# Patient Record
Sex: Male | Born: 1935 | Race: White | Hispanic: No | Marital: Married | Smoking: Former smoker
Health system: Southern US, Community
[De-identification: ages and names within clinical notes are randomized; demographics above are authoritative.]

## PROBLEM LIST (undated history)

## (undated) DIAGNOSIS — F0280 Dementia in other diseases classified elsewhere without behavioral disturbance: Secondary | ICD-10-CM

## (undated) DIAGNOSIS — C801 Malignant (primary) neoplasm, unspecified: Secondary | ICD-10-CM

## (undated) DIAGNOSIS — M1990 Unspecified osteoarthritis, unspecified site: Secondary | ICD-10-CM

## (undated) DIAGNOSIS — I10 Essential (primary) hypertension: Secondary | ICD-10-CM

## (undated) DIAGNOSIS — K219 Gastro-esophageal reflux disease without esophagitis: Secondary | ICD-10-CM

## (undated) DIAGNOSIS — G309 Alzheimer's disease, unspecified: Secondary | ICD-10-CM

## (undated) DIAGNOSIS — E119 Type 2 diabetes mellitus without complications: Secondary | ICD-10-CM

## (undated) DIAGNOSIS — E785 Hyperlipidemia, unspecified: Secondary | ICD-10-CM

## (undated) HISTORY — PX: CATARACT EXTRACTION: SUR2

## (undated) HISTORY — PX: BACK SURGERY: SHX140

---

## 1998-10-04 ENCOUNTER — Ambulatory Visit (HOSPITAL_COMMUNITY): Admission: RE | Admit: 1998-10-04 | Discharge: 1998-10-04 | Payer: Self-pay | Admitting: Gastroenterology

## 1999-05-18 ENCOUNTER — Encounter (HOSPITAL_COMMUNITY): Admission: RE | Admit: 1999-05-18 | Discharge: 1999-08-16 | Payer: Self-pay | Admitting: Gastroenterology

## 1999-10-05 ENCOUNTER — Encounter (HOSPITAL_COMMUNITY): Admission: RE | Admit: 1999-10-05 | Discharge: 2000-01-03 | Payer: Self-pay | Admitting: Gastroenterology

## 2000-02-07 ENCOUNTER — Encounter (HOSPITAL_COMMUNITY): Admission: RE | Admit: 2000-02-07 | Discharge: 2000-05-07 | Payer: Self-pay | Admitting: Gastroenterology

## 2000-08-22 ENCOUNTER — Encounter (HOSPITAL_COMMUNITY): Admission: RE | Admit: 2000-08-22 | Discharge: 2000-11-20 | Payer: Self-pay | Admitting: Gastroenterology

## 2001-01-16 HISTORY — PX: ROTATOR CUFF REPAIR: SHX139

## 2001-02-13 ENCOUNTER — Encounter (HOSPITAL_COMMUNITY): Admission: RE | Admit: 2001-02-13 | Discharge: 2001-05-14 | Payer: Self-pay | Admitting: Gastroenterology

## 2001-05-23 ENCOUNTER — Ambulatory Visit (HOSPITAL_COMMUNITY): Admission: RE | Admit: 2001-05-23 | Discharge: 2001-05-23 | Payer: Self-pay | Admitting: Gastroenterology

## 2001-06-18 ENCOUNTER — Encounter (HOSPITAL_COMMUNITY): Admission: RE | Admit: 2001-06-18 | Discharge: 2001-09-16 | Payer: Self-pay | Admitting: Gastroenterology

## 2001-07-09 ENCOUNTER — Encounter: Payer: Self-pay | Admitting: Neurological Surgery

## 2001-07-09 ENCOUNTER — Ambulatory Visit (HOSPITAL_COMMUNITY): Admission: RE | Admit: 2001-07-09 | Discharge: 2001-07-10 | Payer: Self-pay | Admitting: Neurological Surgery

## 2001-09-02 ENCOUNTER — Encounter: Admission: RE | Admit: 2001-09-02 | Discharge: 2001-12-01 | Payer: Self-pay | Admitting: Internal Medicine

## 2001-10-22 ENCOUNTER — Encounter (HOSPITAL_COMMUNITY): Admission: RE | Admit: 2001-10-22 | Discharge: 2002-01-20 | Payer: Self-pay | Admitting: Gastroenterology

## 2002-02-25 ENCOUNTER — Ambulatory Visit (HOSPITAL_COMMUNITY): Admission: RE | Admit: 2002-02-25 | Discharge: 2002-02-25 | Payer: Self-pay | Admitting: Gastroenterology

## 2002-06-26 ENCOUNTER — Ambulatory Visit (HOSPITAL_COMMUNITY): Admission: RE | Admit: 2002-06-26 | Discharge: 2002-06-26 | Payer: Self-pay | Admitting: Gastroenterology

## 2002-10-28 ENCOUNTER — Ambulatory Visit (HOSPITAL_COMMUNITY): Admission: RE | Admit: 2002-10-28 | Discharge: 2002-10-28 | Payer: Self-pay | Admitting: Gastroenterology

## 2003-04-08 ENCOUNTER — Ambulatory Visit (HOSPITAL_COMMUNITY): Admission: RE | Admit: 2003-04-08 | Discharge: 2003-04-08 | Payer: Self-pay | Admitting: Gastroenterology

## 2003-08-19 ENCOUNTER — Ambulatory Visit (HOSPITAL_COMMUNITY): Admission: RE | Admit: 2003-08-19 | Discharge: 2003-08-19 | Payer: Self-pay | Admitting: Gastroenterology

## 2004-09-05 ENCOUNTER — Encounter: Admission: RE | Admit: 2004-09-05 | Discharge: 2004-09-05 | Payer: Self-pay | Admitting: Neurology

## 2007-11-07 ENCOUNTER — Encounter: Admission: RE | Admit: 2007-11-07 | Discharge: 2007-11-07 | Payer: Self-pay | Admitting: Internal Medicine

## 2008-05-05 ENCOUNTER — Encounter (HOSPITAL_COMMUNITY): Admission: RE | Admit: 2008-05-05 | Discharge: 2008-08-03 | Payer: Self-pay | Admitting: Gastroenterology

## 2008-07-28 ENCOUNTER — Encounter: Admission: RE | Admit: 2008-07-28 | Discharge: 2008-07-28 | Payer: Self-pay | Admitting: Internal Medicine

## 2008-08-06 ENCOUNTER — Encounter (HOSPITAL_COMMUNITY): Admission: RE | Admit: 2008-08-06 | Discharge: 2008-11-04 | Payer: Self-pay | Admitting: Gastroenterology

## 2009-02-24 ENCOUNTER — Encounter: Admission: RE | Admit: 2009-02-24 | Discharge: 2009-02-24 | Payer: Self-pay | Admitting: Endocrinology

## 2009-08-18 ENCOUNTER — Encounter: Admission: RE | Admit: 2009-08-18 | Discharge: 2009-08-18 | Payer: Self-pay | Admitting: Endocrinology

## 2010-02-06 ENCOUNTER — Encounter: Payer: Self-pay | Admitting: Internal Medicine

## 2011-01-25 DIAGNOSIS — M5137 Other intervertebral disc degeneration, lumbosacral region: Secondary | ICD-10-CM | POA: Diagnosis not present

## 2011-01-25 DIAGNOSIS — M999 Biomechanical lesion, unspecified: Secondary | ICD-10-CM | POA: Diagnosis not present

## 2011-02-01 DIAGNOSIS — M5137 Other intervertebral disc degeneration, lumbosacral region: Secondary | ICD-10-CM | POA: Diagnosis not present

## 2011-02-01 DIAGNOSIS — M999 Biomechanical lesion, unspecified: Secondary | ICD-10-CM | POA: Diagnosis not present

## 2011-02-08 DIAGNOSIS — M5137 Other intervertebral disc degeneration, lumbosacral region: Secondary | ICD-10-CM | POA: Diagnosis not present

## 2011-02-08 DIAGNOSIS — M999 Biomechanical lesion, unspecified: Secondary | ICD-10-CM | POA: Diagnosis not present

## 2011-02-15 DIAGNOSIS — M5137 Other intervertebral disc degeneration, lumbosacral region: Secondary | ICD-10-CM | POA: Diagnosis not present

## 2011-02-15 DIAGNOSIS — M999 Biomechanical lesion, unspecified: Secondary | ICD-10-CM | POA: Diagnosis not present

## 2011-03-22 DIAGNOSIS — M999 Biomechanical lesion, unspecified: Secondary | ICD-10-CM | POA: Diagnosis not present

## 2011-03-22 DIAGNOSIS — M5137 Other intervertebral disc degeneration, lumbosacral region: Secondary | ICD-10-CM | POA: Diagnosis not present

## 2011-03-29 DIAGNOSIS — M5137 Other intervertebral disc degeneration, lumbosacral region: Secondary | ICD-10-CM | POA: Diagnosis not present

## 2011-03-29 DIAGNOSIS — M999 Biomechanical lesion, unspecified: Secondary | ICD-10-CM | POA: Diagnosis not present

## 2011-04-04 DIAGNOSIS — M7020 Olecranon bursitis, unspecified elbow: Secondary | ICD-10-CM | POA: Diagnosis not present

## 2011-04-05 DIAGNOSIS — M999 Biomechanical lesion, unspecified: Secondary | ICD-10-CM | POA: Diagnosis not present

## 2011-04-05 DIAGNOSIS — M5137 Other intervertebral disc degeneration, lumbosacral region: Secondary | ICD-10-CM | POA: Diagnosis not present

## 2011-04-12 DIAGNOSIS — M999 Biomechanical lesion, unspecified: Secondary | ICD-10-CM | POA: Diagnosis not present

## 2011-04-12 DIAGNOSIS — M5137 Other intervertebral disc degeneration, lumbosacral region: Secondary | ICD-10-CM | POA: Diagnosis not present

## 2011-04-19 DIAGNOSIS — M999 Biomechanical lesion, unspecified: Secondary | ICD-10-CM | POA: Diagnosis not present

## 2011-04-19 DIAGNOSIS — M5137 Other intervertebral disc degeneration, lumbosacral region: Secondary | ICD-10-CM | POA: Diagnosis not present

## 2011-05-04 DIAGNOSIS — M999 Biomechanical lesion, unspecified: Secondary | ICD-10-CM | POA: Diagnosis not present

## 2011-05-04 DIAGNOSIS — M5137 Other intervertebral disc degeneration, lumbosacral region: Secondary | ICD-10-CM | POA: Diagnosis not present

## 2011-05-09 DIAGNOSIS — J329 Chronic sinusitis, unspecified: Secondary | ICD-10-CM | POA: Diagnosis not present

## 2011-05-24 DIAGNOSIS — M999 Biomechanical lesion, unspecified: Secondary | ICD-10-CM | POA: Diagnosis not present

## 2011-05-24 DIAGNOSIS — M5137 Other intervertebral disc degeneration, lumbosacral region: Secondary | ICD-10-CM | POA: Diagnosis not present

## 2011-05-24 DIAGNOSIS — M7020 Olecranon bursitis, unspecified elbow: Secondary | ICD-10-CM | POA: Diagnosis not present

## 2011-05-31 DIAGNOSIS — M5137 Other intervertebral disc degeneration, lumbosacral region: Secondary | ICD-10-CM | POA: Diagnosis not present

## 2011-05-31 DIAGNOSIS — M999 Biomechanical lesion, unspecified: Secondary | ICD-10-CM | POA: Diagnosis not present

## 2011-06-21 DIAGNOSIS — M5137 Other intervertebral disc degeneration, lumbosacral region: Secondary | ICD-10-CM | POA: Diagnosis not present

## 2011-06-21 DIAGNOSIS — M999 Biomechanical lesion, unspecified: Secondary | ICD-10-CM | POA: Diagnosis not present

## 2011-06-28 DIAGNOSIS — M999 Biomechanical lesion, unspecified: Secondary | ICD-10-CM | POA: Diagnosis not present

## 2011-06-28 DIAGNOSIS — M5137 Other intervertebral disc degeneration, lumbosacral region: Secondary | ICD-10-CM | POA: Diagnosis not present

## 2011-07-05 DIAGNOSIS — M5137 Other intervertebral disc degeneration, lumbosacral region: Secondary | ICD-10-CM | POA: Diagnosis not present

## 2011-07-05 DIAGNOSIS — M999 Biomechanical lesion, unspecified: Secondary | ICD-10-CM | POA: Diagnosis not present

## 2011-07-12 DIAGNOSIS — M999 Biomechanical lesion, unspecified: Secondary | ICD-10-CM | POA: Diagnosis not present

## 2011-07-12 DIAGNOSIS — M5137 Other intervertebral disc degeneration, lumbosacral region: Secondary | ICD-10-CM | POA: Diagnosis not present

## 2011-07-28 DIAGNOSIS — E7800 Pure hypercholesterolemia, unspecified: Secondary | ICD-10-CM | POA: Diagnosis not present

## 2011-08-01 DIAGNOSIS — E119 Type 2 diabetes mellitus without complications: Secondary | ICD-10-CM | POA: Diagnosis not present

## 2011-08-01 DIAGNOSIS — I10 Essential (primary) hypertension: Secondary | ICD-10-CM | POA: Diagnosis not present

## 2011-08-01 DIAGNOSIS — E7800 Pure hypercholesterolemia, unspecified: Secondary | ICD-10-CM | POA: Diagnosis not present

## 2011-08-02 DIAGNOSIS — M999 Biomechanical lesion, unspecified: Secondary | ICD-10-CM | POA: Diagnosis not present

## 2011-08-02 DIAGNOSIS — M5137 Other intervertebral disc degeneration, lumbosacral region: Secondary | ICD-10-CM | POA: Diagnosis not present

## 2011-08-09 DIAGNOSIS — M999 Biomechanical lesion, unspecified: Secondary | ICD-10-CM | POA: Diagnosis not present

## 2011-08-09 DIAGNOSIS — M5137 Other intervertebral disc degeneration, lumbosacral region: Secondary | ICD-10-CM | POA: Diagnosis not present

## 2011-08-16 DIAGNOSIS — M999 Biomechanical lesion, unspecified: Secondary | ICD-10-CM | POA: Diagnosis not present

## 2011-08-16 DIAGNOSIS — M5137 Other intervertebral disc degeneration, lumbosacral region: Secondary | ICD-10-CM | POA: Diagnosis not present

## 2011-09-06 DIAGNOSIS — M999 Biomechanical lesion, unspecified: Secondary | ICD-10-CM | POA: Diagnosis not present

## 2011-09-06 DIAGNOSIS — M5137 Other intervertebral disc degeneration, lumbosacral region: Secondary | ICD-10-CM | POA: Diagnosis not present

## 2011-09-13 DIAGNOSIS — M999 Biomechanical lesion, unspecified: Secondary | ICD-10-CM | POA: Diagnosis not present

## 2011-09-13 DIAGNOSIS — M5137 Other intervertebral disc degeneration, lumbosacral region: Secondary | ICD-10-CM | POA: Diagnosis not present

## 2011-09-20 DIAGNOSIS — M999 Biomechanical lesion, unspecified: Secondary | ICD-10-CM | POA: Diagnosis not present

## 2011-09-20 DIAGNOSIS — M5137 Other intervertebral disc degeneration, lumbosacral region: Secondary | ICD-10-CM | POA: Diagnosis not present

## 2011-09-27 DIAGNOSIS — M999 Biomechanical lesion, unspecified: Secondary | ICD-10-CM | POA: Diagnosis not present

## 2011-09-27 DIAGNOSIS — M5137 Other intervertebral disc degeneration, lumbosacral region: Secondary | ICD-10-CM | POA: Diagnosis not present

## 2011-10-04 DIAGNOSIS — M999 Biomechanical lesion, unspecified: Secondary | ICD-10-CM | POA: Diagnosis not present

## 2011-10-04 DIAGNOSIS — M5137 Other intervertebral disc degeneration, lumbosacral region: Secondary | ICD-10-CM | POA: Diagnosis not present

## 2011-10-11 DIAGNOSIS — M999 Biomechanical lesion, unspecified: Secondary | ICD-10-CM | POA: Diagnosis not present

## 2011-10-11 DIAGNOSIS — M5137 Other intervertebral disc degeneration, lumbosacral region: Secondary | ICD-10-CM | POA: Diagnosis not present

## 2011-10-18 DIAGNOSIS — M999 Biomechanical lesion, unspecified: Secondary | ICD-10-CM | POA: Diagnosis not present

## 2011-10-18 DIAGNOSIS — M5137 Other intervertebral disc degeneration, lumbosacral region: Secondary | ICD-10-CM | POA: Diagnosis not present

## 2011-10-20 DIAGNOSIS — D044 Carcinoma in situ of skin of scalp and neck: Secondary | ICD-10-CM | POA: Diagnosis not present

## 2011-10-25 DIAGNOSIS — M5137 Other intervertebral disc degeneration, lumbosacral region: Secondary | ICD-10-CM | POA: Diagnosis not present

## 2011-10-25 DIAGNOSIS — M999 Biomechanical lesion, unspecified: Secondary | ICD-10-CM | POA: Diagnosis not present

## 2011-11-01 DIAGNOSIS — M999 Biomechanical lesion, unspecified: Secondary | ICD-10-CM | POA: Diagnosis not present

## 2011-11-01 DIAGNOSIS — M5137 Other intervertebral disc degeneration, lumbosacral region: Secondary | ICD-10-CM | POA: Diagnosis not present

## 2011-11-07 DIAGNOSIS — E119 Type 2 diabetes mellitus without complications: Secondary | ICD-10-CM | POA: Diagnosis not present

## 2011-11-07 DIAGNOSIS — E7800 Pure hypercholesterolemia, unspecified: Secondary | ICD-10-CM | POA: Diagnosis not present

## 2011-11-08 DIAGNOSIS — M5137 Other intervertebral disc degeneration, lumbosacral region: Secondary | ICD-10-CM | POA: Diagnosis not present

## 2011-11-08 DIAGNOSIS — M999 Biomechanical lesion, unspecified: Secondary | ICD-10-CM | POA: Diagnosis not present

## 2011-11-09 DIAGNOSIS — E119 Type 2 diabetes mellitus without complications: Secondary | ICD-10-CM | POA: Diagnosis not present

## 2011-11-09 DIAGNOSIS — E7800 Pure hypercholesterolemia, unspecified: Secondary | ICD-10-CM | POA: Diagnosis not present

## 2011-11-09 DIAGNOSIS — I10 Essential (primary) hypertension: Secondary | ICD-10-CM | POA: Diagnosis not present

## 2011-11-09 DIAGNOSIS — Z23 Encounter for immunization: Secondary | ICD-10-CM | POA: Diagnosis not present

## 2011-11-15 DIAGNOSIS — M999 Biomechanical lesion, unspecified: Secondary | ICD-10-CM | POA: Diagnosis not present

## 2011-11-15 DIAGNOSIS — M5137 Other intervertebral disc degeneration, lumbosacral region: Secondary | ICD-10-CM | POA: Diagnosis not present

## 2011-11-22 DIAGNOSIS — M999 Biomechanical lesion, unspecified: Secondary | ICD-10-CM | POA: Diagnosis not present

## 2011-11-22 DIAGNOSIS — M5137 Other intervertebral disc degeneration, lumbosacral region: Secondary | ICD-10-CM | POA: Diagnosis not present

## 2011-11-23 DIAGNOSIS — D044 Carcinoma in situ of skin of scalp and neck: Secondary | ICD-10-CM | POA: Diagnosis not present

## 2011-11-23 DIAGNOSIS — L570 Actinic keratosis: Secondary | ICD-10-CM | POA: Diagnosis not present

## 2011-11-29 DIAGNOSIS — M999 Biomechanical lesion, unspecified: Secondary | ICD-10-CM | POA: Diagnosis not present

## 2011-11-29 DIAGNOSIS — M5137 Other intervertebral disc degeneration, lumbosacral region: Secondary | ICD-10-CM | POA: Diagnosis not present

## 2011-12-04 DIAGNOSIS — E119 Type 2 diabetes mellitus without complications: Secondary | ICD-10-CM | POA: Diagnosis not present

## 2011-12-04 DIAGNOSIS — Z0000 Encounter for general adult medical examination without abnormal findings: Secondary | ICD-10-CM | POA: Diagnosis not present

## 2011-12-04 DIAGNOSIS — Z125 Encounter for screening for malignant neoplasm of prostate: Secondary | ICD-10-CM | POA: Diagnosis not present

## 2011-12-06 DIAGNOSIS — M999 Biomechanical lesion, unspecified: Secondary | ICD-10-CM | POA: Diagnosis not present

## 2011-12-06 DIAGNOSIS — M5137 Other intervertebral disc degeneration, lumbosacral region: Secondary | ICD-10-CM | POA: Diagnosis not present

## 2011-12-08 DIAGNOSIS — Z0000 Encounter for general adult medical examination without abnormal findings: Secondary | ICD-10-CM | POA: Diagnosis not present

## 2011-12-08 DIAGNOSIS — R195 Other fecal abnormalities: Secondary | ICD-10-CM | POA: Diagnosis not present

## 2011-12-08 DIAGNOSIS — K921 Melena: Secondary | ICD-10-CM | POA: Diagnosis not present

## 2011-12-08 DIAGNOSIS — E7800 Pure hypercholesterolemia, unspecified: Secondary | ICD-10-CM | POA: Diagnosis not present

## 2011-12-08 DIAGNOSIS — K219 Gastro-esophageal reflux disease without esophagitis: Secondary | ICD-10-CM | POA: Diagnosis not present

## 2011-12-08 DIAGNOSIS — I10 Essential (primary) hypertension: Secondary | ICD-10-CM | POA: Diagnosis not present

## 2011-12-08 DIAGNOSIS — E119 Type 2 diabetes mellitus without complications: Secondary | ICD-10-CM | POA: Diagnosis not present

## 2011-12-19 DIAGNOSIS — E7800 Pure hypercholesterolemia, unspecified: Secondary | ICD-10-CM | POA: Diagnosis not present

## 2011-12-19 DIAGNOSIS — Z1212 Encounter for screening for malignant neoplasm of rectum: Secondary | ICD-10-CM | POA: Diagnosis not present

## 2011-12-19 DIAGNOSIS — E119 Type 2 diabetes mellitus without complications: Secondary | ICD-10-CM | POA: Diagnosis not present

## 2011-12-21 DIAGNOSIS — I10 Essential (primary) hypertension: Secondary | ICD-10-CM | POA: Diagnosis not present

## 2011-12-21 DIAGNOSIS — E119 Type 2 diabetes mellitus without complications: Secondary | ICD-10-CM | POA: Diagnosis not present

## 2011-12-21 DIAGNOSIS — M5137 Other intervertebral disc degeneration, lumbosacral region: Secondary | ICD-10-CM | POA: Diagnosis not present

## 2011-12-21 DIAGNOSIS — E7800 Pure hypercholesterolemia, unspecified: Secondary | ICD-10-CM | POA: Diagnosis not present

## 2011-12-21 DIAGNOSIS — M999 Biomechanical lesion, unspecified: Secondary | ICD-10-CM | POA: Diagnosis not present

## 2011-12-27 DIAGNOSIS — M999 Biomechanical lesion, unspecified: Secondary | ICD-10-CM | POA: Diagnosis not present

## 2011-12-27 DIAGNOSIS — M5137 Other intervertebral disc degeneration, lumbosacral region: Secondary | ICD-10-CM | POA: Diagnosis not present

## 2011-12-28 DIAGNOSIS — E119 Type 2 diabetes mellitus without complications: Secondary | ICD-10-CM | POA: Diagnosis not present

## 2012-01-01 ENCOUNTER — Emergency Department (HOSPITAL_COMMUNITY): Payer: Medicare Other

## 2012-01-01 ENCOUNTER — Encounter (HOSPITAL_COMMUNITY): Payer: Self-pay | Admitting: Emergency Medicine

## 2012-01-01 ENCOUNTER — Emergency Department (HOSPITAL_COMMUNITY)
Admission: EM | Admit: 2012-01-01 | Discharge: 2012-01-01 | Disposition: A | Discharge status: Home / Self Care | Payer: Medicare Other | Attending: Emergency Medicine | Admitting: Emergency Medicine

## 2012-01-01 DIAGNOSIS — R339 Retention of urine, unspecified: Secondary | ICD-10-CM | POA: Insufficient documentation

## 2012-01-01 DIAGNOSIS — E119 Type 2 diabetes mellitus without complications: Secondary | ICD-10-CM | POA: Diagnosis not present

## 2012-01-01 DIAGNOSIS — K5900 Constipation, unspecified: Secondary | ICD-10-CM | POA: Diagnosis not present

## 2012-01-01 DIAGNOSIS — E785 Hyperlipidemia, unspecified: Secondary | ICD-10-CM | POA: Insufficient documentation

## 2012-01-01 DIAGNOSIS — Z79899 Other long term (current) drug therapy: Secondary | ICD-10-CM | POA: Diagnosis not present

## 2012-01-01 DIAGNOSIS — C4490 Unspecified malignant neoplasm of skin, unspecified: Secondary | ICD-10-CM | POA: Insufficient documentation

## 2012-01-01 DIAGNOSIS — Z87891 Personal history of nicotine dependence: Secondary | ICD-10-CM | POA: Diagnosis not present

## 2012-01-01 HISTORY — DX: Type 2 diabetes mellitus without complications: E11.9

## 2012-01-01 HISTORY — DX: Malignant (primary) neoplasm, unspecified: C80.1

## 2012-01-01 HISTORY — DX: Hyperlipidemia, unspecified: E78.5

## 2012-01-01 LAB — COMPREHENSIVE METABOLIC PANEL
AST: 71 U/L — ABNORMAL HIGH (ref 0–37)
BUN: 19 mg/dL (ref 6–23)
CO2: 26 mEq/L (ref 19–32)
Calcium: 9.4 mg/dL (ref 8.4–10.5)
Chloride: 98 mEq/L (ref 96–112)
Creatinine, Ser: 0.72 mg/dL (ref 0.50–1.35)
GFR calc Af Amer: >90 mL/min (ref >90)
GFR calc non Af Amer: 90 mL/min — ABNORMAL LOW (ref >90)
Glucose, Bld: 169 mg/dL — ABNORMAL HIGH (ref 70–99)
Total Bilirubin: 0.6 mg/dL (ref 0.3–1.2)

## 2012-01-01 LAB — CBC WITH DIFFERENTIAL/PLATELET
Basophils Absolute: 0 10*3/uL (ref 0.0–0.1)
Eosinophils Relative: 0 % (ref 0–5)
HCT: 44.4 % (ref 39.0–52.0)
Hemoglobin: 11.4 g/dL — ABNORMAL LOW (ref 13.0–17.0)
Lymphocytes Relative: 3 % — ABNORMAL LOW (ref 12–46)
MCHC: 25.7 g/dL — ABNORMAL LOW (ref 30.0–36.0)
MCV: 91.5 fL (ref 78.0–100.0)
Monocytes Absolute: 0.4 10*3/uL (ref 0.1–1.0)
Monocytes Relative: 4 % (ref 3–12)
RDW: 11.9 % (ref 11.5–15.5)
WBC: 11.4 10*3/uL — ABNORMAL HIGH (ref 4.0–10.5)

## 2012-01-01 LAB — URINALYSIS, MICROSCOPIC ONLY
Leukocytes, UA: NEGATIVE
Nitrite: NEGATIVE
Protein, ur: NEGATIVE mg/dL
Specific Gravity, Urine: 1.024 (ref 1.005–1.030)
Urobilinogen, UA: 1 mg/dL (ref 0.0–1.0)

## 2012-01-01 NOTE — ED Notes (Signed)
Pt states he has been unable to void since 6am, c/o pressure in his lower abd.  Pt also c/o constipation, to a laxative this am, pt has had some relief since laxative.  Pt denies h/o urinary retention in the past.

## 2012-01-01 NOTE — ED Notes (Signed)
Pt reports "external" bleeding, hx of hemmroids

## 2012-01-01 NOTE — ED Provider Notes (Signed)
History     CSN: [id number]  Arrival date & time 01/01/12  1251   First MD Initiated Contact with Patient 01/01/12 1334      Chief Complaint  Patient presents with  . Urinary Retention  . Constipation    (Consider location/radiation/quality/duration/timing/severity/associated sxs/prior treatment) The history is provided by the patient.  Daniel [name] is a 76 y.o. male hx of DM, HL here with lower ab pain, constipation, urinary retention. Since 6 AM this morning, he was unable to urinate. He was also constipated and took some miralax and was able to have a large bowel movement and felt better. Was only able to urinate very little. No hx of prostate Ca or BPH in the past. No fevers.    Past Medical History  Diagnosis Date  . Cancer     Skin  . Diabetes mellitus without complication   . Hyperlipidemia     Past Surgical History  Procedure Date  . Rotator cuff repair 2003  . Cataract extraction   . Back surgery     No family history on file.  History  Substance Use Topics  . Smoking status: Former Smoker    Quit date: 01/16/1978  . Smokeless tobacco: Not on file  . Alcohol Use: Yes     Comment: occasionally      Review of Systems  Gastrointestinal: Positive for abdominal pain and constipation.  All other systems reviewed and are negative.    Allergies  Review of patient's allergies indicates no known allergies.  Home Medications   Current Outpatient Rx  Name  Route  Sig  Dispense  Refill  . ATORVASTATIN CALCIUM 40 MG PO TABS   Oral   Take 40 mg by mouth daily.         . CELECOXIB 200 MG PO CAPS   Oral   Take 200 mg by mouth daily.         Marland Kitchen MAGNESIUM-ZINC PO   Oral   Take 1 tablet by mouth daily.         . ADULT MULTIVITAMIN W/MINERALS CH   Oral   Take 1 tablet by mouth daily.         Marland Kitchen PANTOPRAZOLE SODIUM 40 MG PO TBEC   Oral   Take 40 mg by mouth daily.         Marland Kitchen RAMIPRIL 2.5 MG PO CAPS   Oral   Take 2.5 mg by mouth every 3  (three) days.         . IMIQUIMOD 5 % EX CREA   Topical   Apply 1 application topically 3 (three) times a week. This was a medication used for skin cancer. Pt was taking three times a week for 8 weeks. Last dose was 12/29/11           BP 144/106  Pulse 81  Temp 97.6 F (36.4 C) (Oral)  Resp 18  SpO2 100%  Physical Exam  Nursing note and vitals reviewed. Constitutional: He is oriented to person, place, and time. He appears well-developed and well-nourished.  HENT:  Head: Normocephalic.  Mouth/Throat: Oropharynx is clear and moist.  Eyes: Conjunctivae normal are normal. Pupils are equal, round, and reactive to light.  Neck: Normal range of motion. Neck supple.  Cardiovascular: Normal rate, regular rhythm and normal heart sounds.   Pulmonary/Chest: Effort normal and breath sounds normal. No respiratory distress. He has no wheezes. He has no rales.  Abdominal: Soft. Bowel sounds are normal.  Mild suprapubic tenderness. Rectal- no stool impaction. Prostate not tender. No CVAT   Musculoskeletal: Normal range of motion. He exhibits no edema and no tenderness.  Neurological: He is alert and oriented to person, place, and time.  Skin: Skin is warm and dry.  Psychiatric: He has a normal mood and affect. His behavior is normal. Judgment and thought content normal.    ED Course  Procedures (including critical care time)  Labs Reviewed  GLUCOSE, CAPILLARY - Abnormal; Notable for the following:    Glucose-Capillary 154 (*)     All other components within normal limits  CBC WITH DIFFERENTIAL - Abnormal; Notable for the following:    WBC 11.4 (*)     Hemoglobin 11.4 (*)     MCH 23.5 (*)     MCHC 25.7 (*)     Neutrophils Relative 93 (*)     Neutro Abs 10.6 (*)     Lymphocytes Relative 3 (*)     Lymphs Abs 0.4 (*)     All other components within normal limits  COMPREHENSIVE METABOLIC PANEL - Abnormal; Notable for the following:    Glucose, Bld 169 (*)     AST 71 (*)      ALT 83 (*)     GFR calc non Af Amer 90 (*)     All other components within normal limits  URINALYSIS, MICROSCOPIC ONLY - Abnormal; Notable for the following:    Glucose, UA 100 (*)     Squamous Epithelial / LPF FEW (*)     All other components within normal limits   No results found.   No diagnosis found.    MDM  Daniel [name] is a 76 y.o. male here with urinary retention, ab pain, constipation. Will check labs, place foley.   3pm  Foley placed by staff, drained out 300 cc clear urine.   3:36 PM Patient's lab unremarkable. UA nl. Will d/c home with foley and f/u with urology.         Richardean Canal, MD 01/01/12 1537

## 2012-01-05 DIAGNOSIS — N401 Enlarged prostate with lower urinary tract symptoms: Secondary | ICD-10-CM | POA: Diagnosis not present

## 2012-01-05 DIAGNOSIS — R339 Retention of urine, unspecified: Secondary | ICD-10-CM | POA: Diagnosis not present

## 2012-01-12 DIAGNOSIS — S91109A Unspecified open wound of unspecified toe(s) without damage to nail, initial encounter: Secondary | ICD-10-CM | POA: Diagnosis not present

## 2012-01-12 DIAGNOSIS — Q6689 Other  specified congenital deformities of feet: Secondary | ICD-10-CM | POA: Diagnosis not present

## 2012-01-12 DIAGNOSIS — E119 Type 2 diabetes mellitus without complications: Secondary | ICD-10-CM | POA: Diagnosis not present

## 2012-01-18 DIAGNOSIS — M5137 Other intervertebral disc degeneration, lumbosacral region: Secondary | ICD-10-CM | POA: Diagnosis not present

## 2012-01-18 DIAGNOSIS — M999 Biomechanical lesion, unspecified: Secondary | ICD-10-CM | POA: Diagnosis not present

## 2012-01-18 DIAGNOSIS — E119 Type 2 diabetes mellitus without complications: Secondary | ICD-10-CM | POA: Diagnosis not present

## 2012-01-22 DIAGNOSIS — E119 Type 2 diabetes mellitus without complications: Secondary | ICD-10-CM | POA: Diagnosis not present

## 2012-01-22 DIAGNOSIS — K921 Melena: Secondary | ICD-10-CM | POA: Diagnosis not present

## 2012-01-22 DIAGNOSIS — K5900 Constipation, unspecified: Secondary | ICD-10-CM | POA: Diagnosis not present

## 2012-01-22 DIAGNOSIS — Z961 Presence of intraocular lens: Secondary | ICD-10-CM | POA: Diagnosis not present

## 2012-01-24 DIAGNOSIS — M5137 Other intervertebral disc degeneration, lumbosacral region: Secondary | ICD-10-CM | POA: Diagnosis not present

## 2012-01-24 DIAGNOSIS — M999 Biomechanical lesion, unspecified: Secondary | ICD-10-CM | POA: Diagnosis not present

## 2012-01-25 DIAGNOSIS — E119 Type 2 diabetes mellitus without complications: Secondary | ICD-10-CM | POA: Diagnosis not present

## 2012-01-25 DIAGNOSIS — E7800 Pure hypercholesterolemia, unspecified: Secondary | ICD-10-CM | POA: Diagnosis not present

## 2012-01-31 DIAGNOSIS — M999 Biomechanical lesion, unspecified: Secondary | ICD-10-CM | POA: Diagnosis not present

## 2012-01-31 DIAGNOSIS — S91109A Unspecified open wound of unspecified toe(s) without damage to nail, initial encounter: Secondary | ICD-10-CM | POA: Diagnosis not present

## 2012-01-31 DIAGNOSIS — M5137 Other intervertebral disc degeneration, lumbosacral region: Secondary | ICD-10-CM | POA: Diagnosis not present

## 2012-02-05 DIAGNOSIS — K921 Melena: Secondary | ICD-10-CM | POA: Diagnosis not present

## 2012-02-05 DIAGNOSIS — E119 Type 2 diabetes mellitus without complications: Secondary | ICD-10-CM | POA: Diagnosis not present

## 2012-02-08 DIAGNOSIS — E7800 Pure hypercholesterolemia, unspecified: Secondary | ICD-10-CM | POA: Diagnosis not present

## 2012-02-08 DIAGNOSIS — I10 Essential (primary) hypertension: Secondary | ICD-10-CM | POA: Diagnosis not present

## 2012-02-08 DIAGNOSIS — E559 Vitamin D deficiency, unspecified: Secondary | ICD-10-CM | POA: Diagnosis not present

## 2012-02-21 DIAGNOSIS — M999 Biomechanical lesion, unspecified: Secondary | ICD-10-CM | POA: Diagnosis not present

## 2012-02-21 DIAGNOSIS — M5137 Other intervertebral disc degeneration, lumbosacral region: Secondary | ICD-10-CM | POA: Diagnosis not present

## 2012-03-06 DIAGNOSIS — M5137 Other intervertebral disc degeneration, lumbosacral region: Secondary | ICD-10-CM | POA: Diagnosis not present

## 2012-03-06 DIAGNOSIS — M999 Biomechanical lesion, unspecified: Secondary | ICD-10-CM | POA: Diagnosis not present

## 2012-04-03 DIAGNOSIS — M999 Biomechanical lesion, unspecified: Secondary | ICD-10-CM | POA: Diagnosis not present

## 2012-04-04 DIAGNOSIS — L570 Actinic keratosis: Secondary | ICD-10-CM | POA: Diagnosis not present

## 2012-04-10 DIAGNOSIS — M999 Biomechanical lesion, unspecified: Secondary | ICD-10-CM | POA: Diagnosis not present

## 2012-04-10 DIAGNOSIS — M5137 Other intervertebral disc degeneration, lumbosacral region: Secondary | ICD-10-CM | POA: Diagnosis not present

## 2012-04-17 DIAGNOSIS — M5137 Other intervertebral disc degeneration, lumbosacral region: Secondary | ICD-10-CM | POA: Diagnosis not present

## 2012-04-17 DIAGNOSIS — M999 Biomechanical lesion, unspecified: Secondary | ICD-10-CM | POA: Diagnosis not present

## 2012-04-24 DIAGNOSIS — M999 Biomechanical lesion, unspecified: Secondary | ICD-10-CM | POA: Diagnosis not present

## 2012-04-24 DIAGNOSIS — M5137 Other intervertebral disc degeneration, lumbosacral region: Secondary | ICD-10-CM | POA: Diagnosis not present

## 2012-05-02 DIAGNOSIS — M999 Biomechanical lesion, unspecified: Secondary | ICD-10-CM | POA: Diagnosis not present

## 2012-05-02 DIAGNOSIS — M5137 Other intervertebral disc degeneration, lumbosacral region: Secondary | ICD-10-CM | POA: Diagnosis not present

## 2012-05-06 DIAGNOSIS — M5137 Other intervertebral disc degeneration, lumbosacral region: Secondary | ICD-10-CM | POA: Diagnosis not present

## 2012-05-06 DIAGNOSIS — M999 Biomechanical lesion, unspecified: Secondary | ICD-10-CM | POA: Diagnosis not present

## 2012-05-07 DIAGNOSIS — M999 Biomechanical lesion, unspecified: Secondary | ICD-10-CM | POA: Diagnosis not present

## 2012-05-07 DIAGNOSIS — E7800 Pure hypercholesterolemia, unspecified: Secondary | ICD-10-CM | POA: Diagnosis not present

## 2012-05-07 DIAGNOSIS — E119 Type 2 diabetes mellitus without complications: Secondary | ICD-10-CM | POA: Diagnosis not present

## 2012-05-07 DIAGNOSIS — M5137 Other intervertebral disc degeneration, lumbosacral region: Secondary | ICD-10-CM | POA: Diagnosis not present

## 2012-05-08 DIAGNOSIS — M5137 Other intervertebral disc degeneration, lumbosacral region: Secondary | ICD-10-CM | POA: Diagnosis not present

## 2012-05-08 DIAGNOSIS — M999 Biomechanical lesion, unspecified: Secondary | ICD-10-CM | POA: Diagnosis not present

## 2012-05-09 DIAGNOSIS — I10 Essential (primary) hypertension: Secondary | ICD-10-CM | POA: Diagnosis not present

## 2012-05-09 DIAGNOSIS — M159 Polyosteoarthritis, unspecified: Secondary | ICD-10-CM | POA: Diagnosis not present

## 2012-05-09 DIAGNOSIS — E7800 Pure hypercholesterolemia, unspecified: Secondary | ICD-10-CM | POA: Diagnosis not present

## 2012-05-09 DIAGNOSIS — E119 Type 2 diabetes mellitus without complications: Secondary | ICD-10-CM | POA: Diagnosis not present

## 2012-05-10 DIAGNOSIS — M5137 Other intervertebral disc degeneration, lumbosacral region: Secondary | ICD-10-CM | POA: Diagnosis not present

## 2012-05-10 DIAGNOSIS — M999 Biomechanical lesion, unspecified: Secondary | ICD-10-CM | POA: Diagnosis not present

## 2012-05-15 DIAGNOSIS — M5137 Other intervertebral disc degeneration, lumbosacral region: Secondary | ICD-10-CM | POA: Diagnosis not present

## 2012-05-15 DIAGNOSIS — M999 Biomechanical lesion, unspecified: Secondary | ICD-10-CM | POA: Diagnosis not present

## 2012-05-22 DIAGNOSIS — M5137 Other intervertebral disc degeneration, lumbosacral region: Secondary | ICD-10-CM | POA: Diagnosis not present

## 2012-05-22 DIAGNOSIS — M999 Biomechanical lesion, unspecified: Secondary | ICD-10-CM | POA: Diagnosis not present

## 2012-05-24 DIAGNOSIS — M5137 Other intervertebral disc degeneration, lumbosacral region: Secondary | ICD-10-CM | POA: Diagnosis not present

## 2012-05-24 DIAGNOSIS — M999 Biomechanical lesion, unspecified: Secondary | ICD-10-CM | POA: Diagnosis not present

## 2012-05-28 DIAGNOSIS — I10 Essential (primary) hypertension: Secondary | ICD-10-CM | POA: Diagnosis not present

## 2012-05-28 DIAGNOSIS — E7800 Pure hypercholesterolemia, unspecified: Secondary | ICD-10-CM | POA: Diagnosis not present

## 2012-05-29 DIAGNOSIS — M5137 Other intervertebral disc degeneration, lumbosacral region: Secondary | ICD-10-CM | POA: Diagnosis not present

## 2012-05-29 DIAGNOSIS — M999 Biomechanical lesion, unspecified: Secondary | ICD-10-CM | POA: Diagnosis not present

## 2012-06-05 DIAGNOSIS — M5137 Other intervertebral disc degeneration, lumbosacral region: Secondary | ICD-10-CM | POA: Diagnosis not present

## 2012-06-05 DIAGNOSIS — M999 Biomechanical lesion, unspecified: Secondary | ICD-10-CM | POA: Diagnosis not present

## 2012-06-19 DIAGNOSIS — M5137 Other intervertebral disc degeneration, lumbosacral region: Secondary | ICD-10-CM | POA: Diagnosis not present

## 2012-06-19 DIAGNOSIS — M999 Biomechanical lesion, unspecified: Secondary | ICD-10-CM | POA: Diagnosis not present

## 2012-07-03 DIAGNOSIS — M5137 Other intervertebral disc degeneration, lumbosacral region: Secondary | ICD-10-CM | POA: Diagnosis not present

## 2012-07-03 DIAGNOSIS — M999 Biomechanical lesion, unspecified: Secondary | ICD-10-CM | POA: Diagnosis not present

## 2012-07-08 DIAGNOSIS — E7800 Pure hypercholesterolemia, unspecified: Secondary | ICD-10-CM | POA: Diagnosis not present

## 2012-07-10 DIAGNOSIS — M5137 Other intervertebral disc degeneration, lumbosacral region: Secondary | ICD-10-CM | POA: Diagnosis not present

## 2012-07-10 DIAGNOSIS — M999 Biomechanical lesion, unspecified: Secondary | ICD-10-CM | POA: Diagnosis not present

## 2012-07-11 DIAGNOSIS — I10 Essential (primary) hypertension: Secondary | ICD-10-CM | POA: Diagnosis not present

## 2012-07-11 DIAGNOSIS — Z006 Encounter for examination for normal comparison and control in clinical research program: Secondary | ICD-10-CM | POA: Diagnosis not present

## 2012-07-11 DIAGNOSIS — E7800 Pure hypercholesterolemia, unspecified: Secondary | ICD-10-CM | POA: Diagnosis not present

## 2012-07-24 DIAGNOSIS — M5137 Other intervertebral disc degeneration, lumbosacral region: Secondary | ICD-10-CM | POA: Diagnosis not present

## 2012-07-24 DIAGNOSIS — M999 Biomechanical lesion, unspecified: Secondary | ICD-10-CM | POA: Diagnosis not present

## 2012-07-31 DIAGNOSIS — M5137 Other intervertebral disc degeneration, lumbosacral region: Secondary | ICD-10-CM | POA: Diagnosis not present

## 2012-07-31 DIAGNOSIS — M999 Biomechanical lesion, unspecified: Secondary | ICD-10-CM | POA: Diagnosis not present

## 2012-08-07 DIAGNOSIS — M5137 Other intervertebral disc degeneration, lumbosacral region: Secondary | ICD-10-CM | POA: Diagnosis not present

## 2012-08-07 DIAGNOSIS — M999 Biomechanical lesion, unspecified: Secondary | ICD-10-CM | POA: Diagnosis not present

## 2012-08-14 DIAGNOSIS — M999 Biomechanical lesion, unspecified: Secondary | ICD-10-CM | POA: Diagnosis not present

## 2012-08-14 DIAGNOSIS — M5137 Other intervertebral disc degeneration, lumbosacral region: Secondary | ICD-10-CM | POA: Diagnosis not present

## 2012-08-21 DIAGNOSIS — M999 Biomechanical lesion, unspecified: Secondary | ICD-10-CM | POA: Diagnosis not present

## 2012-08-21 DIAGNOSIS — M5137 Other intervertebral disc degeneration, lumbosacral region: Secondary | ICD-10-CM | POA: Diagnosis not present

## 2012-09-04 DIAGNOSIS — M5137 Other intervertebral disc degeneration, lumbosacral region: Secondary | ICD-10-CM | POA: Diagnosis not present

## 2012-09-04 DIAGNOSIS — M9981 Other biomechanical lesions of cervical region: Secondary | ICD-10-CM | POA: Diagnosis not present

## 2012-09-04 DIAGNOSIS — M999 Biomechanical lesion, unspecified: Secondary | ICD-10-CM | POA: Diagnosis not present

## 2012-09-11 DIAGNOSIS — M9981 Other biomechanical lesions of cervical region: Secondary | ICD-10-CM | POA: Diagnosis not present

## 2012-09-11 DIAGNOSIS — M999 Biomechanical lesion, unspecified: Secondary | ICD-10-CM | POA: Diagnosis not present

## 2012-09-11 DIAGNOSIS — M5137 Other intervertebral disc degeneration, lumbosacral region: Secondary | ICD-10-CM | POA: Diagnosis not present

## 2012-09-25 DIAGNOSIS — M9981 Other biomechanical lesions of cervical region: Secondary | ICD-10-CM | POA: Diagnosis not present

## 2012-09-25 DIAGNOSIS — M999 Biomechanical lesion, unspecified: Secondary | ICD-10-CM | POA: Diagnosis not present

## 2012-09-25 DIAGNOSIS — M5137 Other intervertebral disc degeneration, lumbosacral region: Secondary | ICD-10-CM | POA: Diagnosis not present

## 2012-10-02 DIAGNOSIS — M9981 Other biomechanical lesions of cervical region: Secondary | ICD-10-CM | POA: Diagnosis not present

## 2012-10-02 DIAGNOSIS — M999 Biomechanical lesion, unspecified: Secondary | ICD-10-CM | POA: Diagnosis not present

## 2012-10-02 DIAGNOSIS — M5137 Other intervertebral disc degeneration, lumbosacral region: Secondary | ICD-10-CM | POA: Diagnosis not present

## 2012-10-07 DIAGNOSIS — E7800 Pure hypercholesterolemia, unspecified: Secondary | ICD-10-CM | POA: Diagnosis not present

## 2012-10-09 DIAGNOSIS — M9981 Other biomechanical lesions of cervical region: Secondary | ICD-10-CM | POA: Diagnosis not present

## 2012-10-09 DIAGNOSIS — M999 Biomechanical lesion, unspecified: Secondary | ICD-10-CM | POA: Diagnosis not present

## 2012-10-09 DIAGNOSIS — M5137 Other intervertebral disc degeneration, lumbosacral region: Secondary | ICD-10-CM | POA: Diagnosis not present

## 2012-10-10 DIAGNOSIS — E7800 Pure hypercholesterolemia, unspecified: Secondary | ICD-10-CM | POA: Diagnosis not present

## 2012-10-10 DIAGNOSIS — E119 Type 2 diabetes mellitus without complications: Secondary | ICD-10-CM | POA: Diagnosis not present

## 2012-10-10 DIAGNOSIS — Z23 Encounter for immunization: Secondary | ICD-10-CM | POA: Diagnosis not present

## 2012-10-10 DIAGNOSIS — I10 Essential (primary) hypertension: Secondary | ICD-10-CM | POA: Diagnosis not present

## 2012-10-16 DIAGNOSIS — M999 Biomechanical lesion, unspecified: Secondary | ICD-10-CM | POA: Diagnosis not present

## 2012-10-16 DIAGNOSIS — M5137 Other intervertebral disc degeneration, lumbosacral region: Secondary | ICD-10-CM | POA: Diagnosis not present

## 2012-10-16 DIAGNOSIS — M9981 Other biomechanical lesions of cervical region: Secondary | ICD-10-CM | POA: Diagnosis not present

## 2012-10-23 DIAGNOSIS — M999 Biomechanical lesion, unspecified: Secondary | ICD-10-CM | POA: Diagnosis not present

## 2012-10-23 DIAGNOSIS — M5137 Other intervertebral disc degeneration, lumbosacral region: Secondary | ICD-10-CM | POA: Diagnosis not present

## 2012-10-23 DIAGNOSIS — M9981 Other biomechanical lesions of cervical region: Secondary | ICD-10-CM | POA: Diagnosis not present

## 2012-10-30 DIAGNOSIS — M5137 Other intervertebral disc degeneration, lumbosacral region: Secondary | ICD-10-CM | POA: Diagnosis not present

## 2012-10-30 DIAGNOSIS — M999 Biomechanical lesion, unspecified: Secondary | ICD-10-CM | POA: Diagnosis not present

## 2012-10-30 DIAGNOSIS — M9981 Other biomechanical lesions of cervical region: Secondary | ICD-10-CM | POA: Diagnosis not present

## 2012-11-13 DIAGNOSIS — M5137 Other intervertebral disc degeneration, lumbosacral region: Secondary | ICD-10-CM | POA: Diagnosis not present

## 2012-11-13 DIAGNOSIS — M9981 Other biomechanical lesions of cervical region: Secondary | ICD-10-CM | POA: Diagnosis not present

## 2012-11-13 DIAGNOSIS — M999 Biomechanical lesion, unspecified: Secondary | ICD-10-CM | POA: Diagnosis not present

## 2012-11-20 DIAGNOSIS — M9981 Other biomechanical lesions of cervical region: Secondary | ICD-10-CM | POA: Diagnosis not present

## 2012-11-20 DIAGNOSIS — M5137 Other intervertebral disc degeneration, lumbosacral region: Secondary | ICD-10-CM | POA: Diagnosis not present

## 2012-11-20 DIAGNOSIS — M999 Biomechanical lesion, unspecified: Secondary | ICD-10-CM | POA: Diagnosis not present

## 2012-11-27 DIAGNOSIS — M9981 Other biomechanical lesions of cervical region: Secondary | ICD-10-CM | POA: Diagnosis not present

## 2012-11-27 DIAGNOSIS — M5137 Other intervertebral disc degeneration, lumbosacral region: Secondary | ICD-10-CM | POA: Diagnosis not present

## 2012-11-27 DIAGNOSIS — M999 Biomechanical lesion, unspecified: Secondary | ICD-10-CM | POA: Diagnosis not present

## 2012-12-04 DIAGNOSIS — M5137 Other intervertebral disc degeneration, lumbosacral region: Secondary | ICD-10-CM | POA: Diagnosis not present

## 2012-12-04 DIAGNOSIS — M999 Biomechanical lesion, unspecified: Secondary | ICD-10-CM | POA: Diagnosis not present

## 2012-12-04 DIAGNOSIS — M9981 Other biomechanical lesions of cervical region: Secondary | ICD-10-CM | POA: Diagnosis not present

## 2012-12-16 DIAGNOSIS — E119 Type 2 diabetes mellitus without complications: Secondary | ICD-10-CM | POA: Diagnosis not present

## 2012-12-16 DIAGNOSIS — Z0000 Encounter for general adult medical examination without abnormal findings: Secondary | ICD-10-CM | POA: Diagnosis not present

## 2012-12-16 DIAGNOSIS — E7800 Pure hypercholesterolemia, unspecified: Secondary | ICD-10-CM | POA: Diagnosis not present

## 2012-12-16 DIAGNOSIS — Z125 Encounter for screening for malignant neoplasm of prostate: Secondary | ICD-10-CM | POA: Diagnosis not present

## 2012-12-16 DIAGNOSIS — I10 Essential (primary) hypertension: Secondary | ICD-10-CM | POA: Diagnosis not present

## 2012-12-17 DIAGNOSIS — M999 Biomechanical lesion, unspecified: Secondary | ICD-10-CM | POA: Diagnosis not present

## 2012-12-17 DIAGNOSIS — M5137 Other intervertebral disc degeneration, lumbosacral region: Secondary | ICD-10-CM | POA: Diagnosis not present

## 2012-12-17 DIAGNOSIS — M9981 Other biomechanical lesions of cervical region: Secondary | ICD-10-CM | POA: Diagnosis not present

## 2012-12-25 DIAGNOSIS — M9981 Other biomechanical lesions of cervical region: Secondary | ICD-10-CM | POA: Diagnosis not present

## 2012-12-25 DIAGNOSIS — M999 Biomechanical lesion, unspecified: Secondary | ICD-10-CM | POA: Diagnosis not present

## 2012-12-25 DIAGNOSIS — M5137 Other intervertebral disc degeneration, lumbosacral region: Secondary | ICD-10-CM | POA: Diagnosis not present

## 2012-12-30 DIAGNOSIS — E119 Type 2 diabetes mellitus without complications: Secondary | ICD-10-CM | POA: Diagnosis not present

## 2012-12-30 DIAGNOSIS — E7800 Pure hypercholesterolemia, unspecified: Secondary | ICD-10-CM | POA: Diagnosis not present

## 2013-01-01 DIAGNOSIS — M5137 Other intervertebral disc degeneration, lumbosacral region: Secondary | ICD-10-CM | POA: Diagnosis not present

## 2013-01-01 DIAGNOSIS — M9981 Other biomechanical lesions of cervical region: Secondary | ICD-10-CM | POA: Diagnosis not present

## 2013-01-01 DIAGNOSIS — M999 Biomechanical lesion, unspecified: Secondary | ICD-10-CM | POA: Diagnosis not present

## 2013-01-02 DIAGNOSIS — I10 Essential (primary) hypertension: Secondary | ICD-10-CM | POA: Diagnosis not present

## 2013-01-02 DIAGNOSIS — E7800 Pure hypercholesterolemia, unspecified: Secondary | ICD-10-CM | POA: Diagnosis not present

## 2013-01-06 DIAGNOSIS — K219 Gastro-esophageal reflux disease without esophagitis: Secondary | ICD-10-CM | POA: Diagnosis not present

## 2013-01-06 DIAGNOSIS — Z1212 Encounter for screening for malignant neoplasm of rectum: Secondary | ICD-10-CM | POA: Diagnosis not present

## 2013-01-06 DIAGNOSIS — I10 Essential (primary) hypertension: Secondary | ICD-10-CM | POA: Diagnosis not present

## 2013-01-06 DIAGNOSIS — E7800 Pure hypercholesterolemia, unspecified: Secondary | ICD-10-CM | POA: Diagnosis not present

## 2013-01-06 DIAGNOSIS — M159 Polyosteoarthritis, unspecified: Secondary | ICD-10-CM | POA: Diagnosis not present

## 2013-01-22 DIAGNOSIS — M5137 Other intervertebral disc degeneration, lumbosacral region: Secondary | ICD-10-CM | POA: Diagnosis not present

## 2013-01-22 DIAGNOSIS — M9981 Other biomechanical lesions of cervical region: Secondary | ICD-10-CM | POA: Diagnosis not present

## 2013-01-22 DIAGNOSIS — M999 Biomechanical lesion, unspecified: Secondary | ICD-10-CM | POA: Diagnosis not present

## 2013-01-28 DIAGNOSIS — M9981 Other biomechanical lesions of cervical region: Secondary | ICD-10-CM | POA: Diagnosis not present

## 2013-01-28 DIAGNOSIS — M999 Biomechanical lesion, unspecified: Secondary | ICD-10-CM | POA: Diagnosis not present

## 2013-01-28 DIAGNOSIS — M5137 Other intervertebral disc degeneration, lumbosacral region: Secondary | ICD-10-CM | POA: Diagnosis not present

## 2013-02-07 DIAGNOSIS — M5137 Other intervertebral disc degeneration, lumbosacral region: Secondary | ICD-10-CM | POA: Diagnosis not present

## 2013-02-07 DIAGNOSIS — M9981 Other biomechanical lesions of cervical region: Secondary | ICD-10-CM | POA: Diagnosis not present

## 2013-02-07 DIAGNOSIS — M999 Biomechanical lesion, unspecified: Secondary | ICD-10-CM | POA: Diagnosis not present

## 2013-02-17 DIAGNOSIS — M999 Biomechanical lesion, unspecified: Secondary | ICD-10-CM | POA: Diagnosis not present

## 2013-02-17 DIAGNOSIS — E119 Type 2 diabetes mellitus without complications: Secondary | ICD-10-CM | POA: Diagnosis not present

## 2013-02-17 DIAGNOSIS — M9981 Other biomechanical lesions of cervical region: Secondary | ICD-10-CM | POA: Diagnosis not present

## 2013-02-17 DIAGNOSIS — Z961 Presence of intraocular lens: Secondary | ICD-10-CM | POA: Diagnosis not present

## 2013-02-17 DIAGNOSIS — M5137 Other intervertebral disc degeneration, lumbosacral region: Secondary | ICD-10-CM | POA: Diagnosis not present

## 2013-02-26 DIAGNOSIS — M5137 Other intervertebral disc degeneration, lumbosacral region: Secondary | ICD-10-CM | POA: Diagnosis not present

## 2013-02-26 DIAGNOSIS — M9981 Other biomechanical lesions of cervical region: Secondary | ICD-10-CM | POA: Diagnosis not present

## 2013-02-26 DIAGNOSIS — M999 Biomechanical lesion, unspecified: Secondary | ICD-10-CM | POA: Diagnosis not present

## 2013-03-12 DIAGNOSIS — B9789 Other viral agents as the cause of diseases classified elsewhere: Secondary | ICD-10-CM | POA: Diagnosis not present

## 2013-03-18 DIAGNOSIS — M9981 Other biomechanical lesions of cervical region: Secondary | ICD-10-CM | POA: Diagnosis not present

## 2013-03-18 DIAGNOSIS — M999 Biomechanical lesion, unspecified: Secondary | ICD-10-CM | POA: Diagnosis not present

## 2013-03-18 DIAGNOSIS — M5137 Other intervertebral disc degeneration, lumbosacral region: Secondary | ICD-10-CM | POA: Diagnosis not present

## 2013-04-02 DIAGNOSIS — IMO0001 Reserved for inherently not codable concepts without codable children: Secondary | ICD-10-CM | POA: Diagnosis not present

## 2013-04-02 DIAGNOSIS — E7800 Pure hypercholesterolemia, unspecified: Secondary | ICD-10-CM | POA: Diagnosis not present

## 2013-04-03 DIAGNOSIS — I10 Essential (primary) hypertension: Secondary | ICD-10-CM | POA: Diagnosis not present

## 2013-04-03 DIAGNOSIS — E1149 Type 2 diabetes mellitus with other diabetic neurological complication: Secondary | ICD-10-CM | POA: Diagnosis not present

## 2013-04-03 DIAGNOSIS — E7800 Pure hypercholesterolemia, unspecified: Secondary | ICD-10-CM | POA: Diagnosis not present

## 2013-04-09 DIAGNOSIS — M9981 Other biomechanical lesions of cervical region: Secondary | ICD-10-CM | POA: Diagnosis not present

## 2013-04-09 DIAGNOSIS — M999 Biomechanical lesion, unspecified: Secondary | ICD-10-CM | POA: Diagnosis not present

## 2013-04-09 DIAGNOSIS — M5137 Other intervertebral disc degeneration, lumbosacral region: Secondary | ICD-10-CM | POA: Diagnosis not present

## 2013-04-17 DIAGNOSIS — M5137 Other intervertebral disc degeneration, lumbosacral region: Secondary | ICD-10-CM | POA: Diagnosis not present

## 2013-04-17 DIAGNOSIS — M9981 Other biomechanical lesions of cervical region: Secondary | ICD-10-CM | POA: Diagnosis not present

## 2013-04-17 DIAGNOSIS — M999 Biomechanical lesion, unspecified: Secondary | ICD-10-CM | POA: Diagnosis not present

## 2013-04-23 DIAGNOSIS — M5137 Other intervertebral disc degeneration, lumbosacral region: Secondary | ICD-10-CM | POA: Diagnosis not present

## 2013-04-23 DIAGNOSIS — M999 Biomechanical lesion, unspecified: Secondary | ICD-10-CM | POA: Diagnosis not present

## 2013-04-23 DIAGNOSIS — M9981 Other biomechanical lesions of cervical region: Secondary | ICD-10-CM | POA: Diagnosis not present

## 2013-04-30 DIAGNOSIS — M999 Biomechanical lesion, unspecified: Secondary | ICD-10-CM | POA: Diagnosis not present

## 2013-04-30 DIAGNOSIS — M9981 Other biomechanical lesions of cervical region: Secondary | ICD-10-CM | POA: Diagnosis not present

## 2013-04-30 DIAGNOSIS — M5137 Other intervertebral disc degeneration, lumbosacral region: Secondary | ICD-10-CM | POA: Diagnosis not present

## 2013-05-06 DIAGNOSIS — M5137 Other intervertebral disc degeneration, lumbosacral region: Secondary | ICD-10-CM | POA: Diagnosis not present

## 2013-05-06 DIAGNOSIS — M9981 Other biomechanical lesions of cervical region: Secondary | ICD-10-CM | POA: Diagnosis not present

## 2013-05-06 DIAGNOSIS — M999 Biomechanical lesion, unspecified: Secondary | ICD-10-CM | POA: Diagnosis not present

## 2013-05-15 DIAGNOSIS — M999 Biomechanical lesion, unspecified: Secondary | ICD-10-CM | POA: Diagnosis not present

## 2013-05-15 DIAGNOSIS — M9981 Other biomechanical lesions of cervical region: Secondary | ICD-10-CM | POA: Diagnosis not present

## 2013-05-15 DIAGNOSIS — M5137 Other intervertebral disc degeneration, lumbosacral region: Secondary | ICD-10-CM | POA: Diagnosis not present

## 2013-05-22 DIAGNOSIS — M999 Biomechanical lesion, unspecified: Secondary | ICD-10-CM | POA: Diagnosis not present

## 2013-05-22 DIAGNOSIS — M5137 Other intervertebral disc degeneration, lumbosacral region: Secondary | ICD-10-CM | POA: Diagnosis not present

## 2013-05-22 DIAGNOSIS — M9981 Other biomechanical lesions of cervical region: Secondary | ICD-10-CM | POA: Diagnosis not present

## 2013-06-17 DIAGNOSIS — M5137 Other intervertebral disc degeneration, lumbosacral region: Secondary | ICD-10-CM | POA: Diagnosis not present

## 2013-06-17 DIAGNOSIS — M9981 Other biomechanical lesions of cervical region: Secondary | ICD-10-CM | POA: Diagnosis not present

## 2013-06-17 DIAGNOSIS — M999 Biomechanical lesion, unspecified: Secondary | ICD-10-CM | POA: Diagnosis not present

## 2013-06-24 DIAGNOSIS — M9981 Other biomechanical lesions of cervical region: Secondary | ICD-10-CM | POA: Diagnosis not present

## 2013-06-24 DIAGNOSIS — M999 Biomechanical lesion, unspecified: Secondary | ICD-10-CM | POA: Diagnosis not present

## 2013-06-24 DIAGNOSIS — M5137 Other intervertebral disc degeneration, lumbosacral region: Secondary | ICD-10-CM | POA: Diagnosis not present

## 2013-07-01 DIAGNOSIS — M999 Biomechanical lesion, unspecified: Secondary | ICD-10-CM | POA: Diagnosis not present

## 2013-07-01 DIAGNOSIS — E7800 Pure hypercholesterolemia, unspecified: Secondary | ICD-10-CM | POA: Diagnosis not present

## 2013-07-01 DIAGNOSIS — M5137 Other intervertebral disc degeneration, lumbosacral region: Secondary | ICD-10-CM | POA: Diagnosis not present

## 2013-07-01 DIAGNOSIS — M9981 Other biomechanical lesions of cervical region: Secondary | ICD-10-CM | POA: Diagnosis not present

## 2013-07-01 DIAGNOSIS — E1149 Type 2 diabetes mellitus with other diabetic neurological complication: Secondary | ICD-10-CM | POA: Diagnosis not present

## 2013-07-03 DIAGNOSIS — I10 Essential (primary) hypertension: Secondary | ICD-10-CM | POA: Diagnosis not present

## 2013-07-03 DIAGNOSIS — E7800 Pure hypercholesterolemia, unspecified: Secondary | ICD-10-CM | POA: Diagnosis not present

## 2013-07-03 DIAGNOSIS — E1149 Type 2 diabetes mellitus with other diabetic neurological complication: Secondary | ICD-10-CM | POA: Diagnosis not present

## 2013-07-07 DIAGNOSIS — E7800 Pure hypercholesterolemia, unspecified: Secondary | ICD-10-CM | POA: Diagnosis not present

## 2013-07-07 DIAGNOSIS — E1149 Type 2 diabetes mellitus with other diabetic neurological complication: Secondary | ICD-10-CM | POA: Diagnosis not present

## 2013-07-10 DIAGNOSIS — M999 Biomechanical lesion, unspecified: Secondary | ICD-10-CM | POA: Diagnosis not present

## 2013-07-10 DIAGNOSIS — M5137 Other intervertebral disc degeneration, lumbosacral region: Secondary | ICD-10-CM | POA: Diagnosis not present

## 2013-07-10 DIAGNOSIS — M9981 Other biomechanical lesions of cervical region: Secondary | ICD-10-CM | POA: Diagnosis not present

## 2013-07-11 DIAGNOSIS — D692 Other nonthrombocytopenic purpura: Secondary | ICD-10-CM | POA: Diagnosis not present

## 2013-07-11 DIAGNOSIS — M310 Hypersensitivity angiitis: Secondary | ICD-10-CM | POA: Diagnosis not present

## 2013-07-11 DIAGNOSIS — L819 Disorder of pigmentation, unspecified: Secondary | ICD-10-CM | POA: Diagnosis not present

## 2013-07-11 DIAGNOSIS — W57XXXA Bitten or stung by nonvenomous insect and other nonvenomous arthropods, initial encounter: Secondary | ICD-10-CM | POA: Diagnosis not present

## 2013-07-11 DIAGNOSIS — T148 Other injury of unspecified body region: Secondary | ICD-10-CM | POA: Diagnosis not present

## 2013-07-11 DIAGNOSIS — L570 Actinic keratosis: Secondary | ICD-10-CM | POA: Diagnosis not present

## 2013-07-11 DIAGNOSIS — L678 Other hair color and hair shaft abnormalities: Secondary | ICD-10-CM | POA: Diagnosis not present

## 2013-07-11 DIAGNOSIS — L738 Other specified follicular disorders: Secondary | ICD-10-CM | POA: Diagnosis not present

## 2013-07-11 DIAGNOSIS — L821 Other seborrheic keratosis: Secondary | ICD-10-CM | POA: Diagnosis not present

## 2013-07-11 DIAGNOSIS — D1801 Hemangioma of skin and subcutaneous tissue: Secondary | ICD-10-CM | POA: Diagnosis not present

## 2013-07-11 DIAGNOSIS — Z85828 Personal history of other malignant neoplasm of skin: Secondary | ICD-10-CM | POA: Diagnosis not present

## 2013-07-17 DIAGNOSIS — M9981 Other biomechanical lesions of cervical region: Secondary | ICD-10-CM | POA: Diagnosis not present

## 2013-07-17 DIAGNOSIS — M5137 Other intervertebral disc degeneration, lumbosacral region: Secondary | ICD-10-CM | POA: Diagnosis not present

## 2013-07-17 DIAGNOSIS — M999 Biomechanical lesion, unspecified: Secondary | ICD-10-CM | POA: Diagnosis not present

## 2013-07-30 DIAGNOSIS — M9981 Other biomechanical lesions of cervical region: Secondary | ICD-10-CM | POA: Diagnosis not present

## 2013-07-30 DIAGNOSIS — M5137 Other intervertebral disc degeneration, lumbosacral region: Secondary | ICD-10-CM | POA: Diagnosis not present

## 2013-07-30 DIAGNOSIS — M999 Biomechanical lesion, unspecified: Secondary | ICD-10-CM | POA: Diagnosis not present

## 2013-08-07 DIAGNOSIS — M9981 Other biomechanical lesions of cervical region: Secondary | ICD-10-CM | POA: Diagnosis not present

## 2013-08-07 DIAGNOSIS — M999 Biomechanical lesion, unspecified: Secondary | ICD-10-CM | POA: Diagnosis not present

## 2013-08-07 DIAGNOSIS — M5137 Other intervertebral disc degeneration, lumbosacral region: Secondary | ICD-10-CM | POA: Diagnosis not present

## 2013-08-14 DIAGNOSIS — M9981 Other biomechanical lesions of cervical region: Secondary | ICD-10-CM | POA: Diagnosis not present

## 2013-08-14 DIAGNOSIS — M999 Biomechanical lesion, unspecified: Secondary | ICD-10-CM | POA: Diagnosis not present

## 2013-08-14 DIAGNOSIS — M5137 Other intervertebral disc degeneration, lumbosacral region: Secondary | ICD-10-CM | POA: Diagnosis not present

## 2013-08-14 DIAGNOSIS — M67919 Unspecified disorder of synovium and tendon, unspecified shoulder: Secondary | ICD-10-CM | POA: Diagnosis not present

## 2013-08-21 DIAGNOSIS — M9981 Other biomechanical lesions of cervical region: Secondary | ICD-10-CM | POA: Diagnosis not present

## 2013-08-21 DIAGNOSIS — M5137 Other intervertebral disc degeneration, lumbosacral region: Secondary | ICD-10-CM | POA: Diagnosis not present

## 2013-08-21 DIAGNOSIS — M999 Biomechanical lesion, unspecified: Secondary | ICD-10-CM | POA: Diagnosis not present

## 2013-09-01 DIAGNOSIS — M9981 Other biomechanical lesions of cervical region: Secondary | ICD-10-CM | POA: Diagnosis not present

## 2013-09-01 DIAGNOSIS — M5137 Other intervertebral disc degeneration, lumbosacral region: Secondary | ICD-10-CM | POA: Diagnosis not present

## 2013-09-01 DIAGNOSIS — M999 Biomechanical lesion, unspecified: Secondary | ICD-10-CM | POA: Diagnosis not present

## 2013-09-09 DIAGNOSIS — M999 Biomechanical lesion, unspecified: Secondary | ICD-10-CM | POA: Diagnosis not present

## 2013-09-09 DIAGNOSIS — M9981 Other biomechanical lesions of cervical region: Secondary | ICD-10-CM | POA: Diagnosis not present

## 2013-09-09 DIAGNOSIS — M5137 Other intervertebral disc degeneration, lumbosacral region: Secondary | ICD-10-CM | POA: Diagnosis not present

## 2013-09-17 DIAGNOSIS — M9981 Other biomechanical lesions of cervical region: Secondary | ICD-10-CM | POA: Diagnosis not present

## 2013-09-17 DIAGNOSIS — M999 Biomechanical lesion, unspecified: Secondary | ICD-10-CM | POA: Diagnosis not present

## 2013-09-17 DIAGNOSIS — M5137 Other intervertebral disc degeneration, lumbosacral region: Secondary | ICD-10-CM | POA: Diagnosis not present

## 2013-09-24 DIAGNOSIS — M9981 Other biomechanical lesions of cervical region: Secondary | ICD-10-CM | POA: Diagnosis not present

## 2013-09-24 DIAGNOSIS — M999 Biomechanical lesion, unspecified: Secondary | ICD-10-CM | POA: Diagnosis not present

## 2013-09-24 DIAGNOSIS — M5137 Other intervertebral disc degeneration, lumbosacral region: Secondary | ICD-10-CM | POA: Diagnosis not present

## 2013-09-29 DIAGNOSIS — I10 Essential (primary) hypertension: Secondary | ICD-10-CM | POA: Diagnosis not present

## 2013-09-29 DIAGNOSIS — M9981 Other biomechanical lesions of cervical region: Secondary | ICD-10-CM | POA: Diagnosis not present

## 2013-09-29 DIAGNOSIS — M5137 Other intervertebral disc degeneration, lumbosacral region: Secondary | ICD-10-CM | POA: Diagnosis not present

## 2013-09-29 DIAGNOSIS — E1149 Type 2 diabetes mellitus with other diabetic neurological complication: Secondary | ICD-10-CM | POA: Diagnosis not present

## 2013-09-29 DIAGNOSIS — M999 Biomechanical lesion, unspecified: Secondary | ICD-10-CM | POA: Diagnosis not present

## 2013-10-02 DIAGNOSIS — M999 Biomechanical lesion, unspecified: Secondary | ICD-10-CM | POA: Diagnosis not present

## 2013-10-02 DIAGNOSIS — E7800 Pure hypercholesterolemia, unspecified: Secondary | ICD-10-CM | POA: Diagnosis not present

## 2013-10-02 DIAGNOSIS — I10 Essential (primary) hypertension: Secondary | ICD-10-CM | POA: Diagnosis not present

## 2013-10-02 DIAGNOSIS — M9981 Other biomechanical lesions of cervical region: Secondary | ICD-10-CM | POA: Diagnosis not present

## 2013-10-02 DIAGNOSIS — E1149 Type 2 diabetes mellitus with other diabetic neurological complication: Secondary | ICD-10-CM | POA: Diagnosis not present

## 2013-10-02 DIAGNOSIS — M5137 Other intervertebral disc degeneration, lumbosacral region: Secondary | ICD-10-CM | POA: Diagnosis not present

## 2013-10-06 DIAGNOSIS — M5137 Other intervertebral disc degeneration, lumbosacral region: Secondary | ICD-10-CM | POA: Diagnosis not present

## 2013-10-06 DIAGNOSIS — M999 Biomechanical lesion, unspecified: Secondary | ICD-10-CM | POA: Diagnosis not present

## 2013-10-06 DIAGNOSIS — M9981 Other biomechanical lesions of cervical region: Secondary | ICD-10-CM | POA: Diagnosis not present

## 2013-10-10 DIAGNOSIS — M9981 Other biomechanical lesions of cervical region: Secondary | ICD-10-CM | POA: Diagnosis not present

## 2013-10-10 DIAGNOSIS — M5137 Other intervertebral disc degeneration, lumbosacral region: Secondary | ICD-10-CM | POA: Diagnosis not present

## 2013-10-10 DIAGNOSIS — M999 Biomechanical lesion, unspecified: Secondary | ICD-10-CM | POA: Diagnosis not present

## 2013-10-21 DIAGNOSIS — M9901 Segmental and somatic dysfunction of cervical region: Secondary | ICD-10-CM | POA: Diagnosis not present

## 2013-10-21 DIAGNOSIS — M5031 Other cervical disc degeneration,  high cervical region: Secondary | ICD-10-CM | POA: Diagnosis not present

## 2013-10-21 DIAGNOSIS — M62838 Other muscle spasm: Secondary | ICD-10-CM | POA: Diagnosis not present

## 2013-10-28 DIAGNOSIS — M9901 Segmental and somatic dysfunction of cervical region: Secondary | ICD-10-CM | POA: Diagnosis not present

## 2013-10-28 DIAGNOSIS — H9190 Unspecified hearing loss, unspecified ear: Secondary | ICD-10-CM | POA: Diagnosis not present

## 2013-10-28 DIAGNOSIS — I10 Essential (primary) hypertension: Secondary | ICD-10-CM | POA: Diagnosis not present

## 2013-10-28 DIAGNOSIS — H6120 Impacted cerumen, unspecified ear: Secondary | ICD-10-CM | POA: Diagnosis not present

## 2013-10-28 DIAGNOSIS — M5031 Other cervical disc degeneration,  high cervical region: Secondary | ICD-10-CM | POA: Diagnosis not present

## 2013-10-28 DIAGNOSIS — M62838 Other muscle spasm: Secondary | ICD-10-CM | POA: Diagnosis not present

## 2013-11-05 DIAGNOSIS — Z23 Encounter for immunization: Secondary | ICD-10-CM | POA: Diagnosis not present

## 2013-11-05 DIAGNOSIS — M62838 Other muscle spasm: Secondary | ICD-10-CM | POA: Diagnosis not present

## 2013-11-05 DIAGNOSIS — M9901 Segmental and somatic dysfunction of cervical region: Secondary | ICD-10-CM | POA: Diagnosis not present

## 2013-11-05 DIAGNOSIS — M5031 Other cervical disc degeneration,  high cervical region: Secondary | ICD-10-CM | POA: Diagnosis not present

## 2013-11-18 DIAGNOSIS — M5031 Other cervical disc degeneration,  high cervical region: Secondary | ICD-10-CM | POA: Diagnosis not present

## 2013-11-18 DIAGNOSIS — M62838 Other muscle spasm: Secondary | ICD-10-CM | POA: Diagnosis not present

## 2013-11-18 DIAGNOSIS — M9901 Segmental and somatic dysfunction of cervical region: Secondary | ICD-10-CM | POA: Diagnosis not present

## 2013-12-01 DIAGNOSIS — M5031 Other cervical disc degeneration,  high cervical region: Secondary | ICD-10-CM | POA: Diagnosis not present

## 2013-12-01 DIAGNOSIS — M62838 Other muscle spasm: Secondary | ICD-10-CM | POA: Diagnosis not present

## 2013-12-01 DIAGNOSIS — M9901 Segmental and somatic dysfunction of cervical region: Secondary | ICD-10-CM | POA: Diagnosis not present

## 2013-12-09 DIAGNOSIS — M62838 Other muscle spasm: Secondary | ICD-10-CM | POA: Diagnosis not present

## 2013-12-09 DIAGNOSIS — M9901 Segmental and somatic dysfunction of cervical region: Secondary | ICD-10-CM | POA: Diagnosis not present

## 2013-12-09 DIAGNOSIS — M5031 Other cervical disc degeneration,  high cervical region: Secondary | ICD-10-CM | POA: Diagnosis not present

## 2013-12-17 DIAGNOSIS — M9901 Segmental and somatic dysfunction of cervical region: Secondary | ICD-10-CM | POA: Diagnosis not present

## 2013-12-17 DIAGNOSIS — M62838 Other muscle spasm: Secondary | ICD-10-CM | POA: Diagnosis not present

## 2013-12-17 DIAGNOSIS — M5031 Other cervical disc degeneration,  high cervical region: Secondary | ICD-10-CM | POA: Diagnosis not present

## 2013-12-22 DIAGNOSIS — M9901 Segmental and somatic dysfunction of cervical region: Secondary | ICD-10-CM | POA: Diagnosis not present

## 2013-12-22 DIAGNOSIS — M62838 Other muscle spasm: Secondary | ICD-10-CM | POA: Diagnosis not present

## 2013-12-22 DIAGNOSIS — M5031 Other cervical disc degeneration,  high cervical region: Secondary | ICD-10-CM | POA: Diagnosis not present

## 2013-12-29 DIAGNOSIS — M5031 Other cervical disc degeneration,  high cervical region: Secondary | ICD-10-CM | POA: Diagnosis not present

## 2013-12-29 DIAGNOSIS — E1140 Type 2 diabetes mellitus with diabetic neuropathy, unspecified: Secondary | ICD-10-CM | POA: Diagnosis not present

## 2013-12-29 DIAGNOSIS — M62838 Other muscle spasm: Secondary | ICD-10-CM | POA: Diagnosis not present

## 2013-12-29 DIAGNOSIS — M9901 Segmental and somatic dysfunction of cervical region: Secondary | ICD-10-CM | POA: Diagnosis not present

## 2014-01-01 DIAGNOSIS — I10 Essential (primary) hypertension: Secondary | ICD-10-CM | POA: Diagnosis not present

## 2014-01-01 DIAGNOSIS — E785 Hyperlipidemia, unspecified: Secondary | ICD-10-CM | POA: Diagnosis not present

## 2014-01-01 DIAGNOSIS — E119 Type 2 diabetes mellitus without complications: Secondary | ICD-10-CM | POA: Diagnosis not present

## 2014-01-07 DIAGNOSIS — R0789 Other chest pain: Secondary | ICD-10-CM | POA: Diagnosis not present

## 2014-01-07 DIAGNOSIS — M9901 Segmental and somatic dysfunction of cervical region: Secondary | ICD-10-CM | POA: Diagnosis not present

## 2014-01-07 DIAGNOSIS — M5031 Other cervical disc degeneration,  high cervical region: Secondary | ICD-10-CM | POA: Diagnosis not present

## 2014-01-07 DIAGNOSIS — K219 Gastro-esophageal reflux disease without esophagitis: Secondary | ICD-10-CM | POA: Diagnosis not present

## 2014-01-07 DIAGNOSIS — M62838 Other muscle spasm: Secondary | ICD-10-CM | POA: Diagnosis not present

## 2014-01-14 DIAGNOSIS — I10 Essential (primary) hypertension: Secondary | ICD-10-CM | POA: Diagnosis not present

## 2014-01-14 DIAGNOSIS — M9901 Segmental and somatic dysfunction of cervical region: Secondary | ICD-10-CM | POA: Diagnosis not present

## 2014-01-14 DIAGNOSIS — R0789 Other chest pain: Secondary | ICD-10-CM | POA: Diagnosis not present

## 2014-01-14 DIAGNOSIS — E780 Pure hypercholesterolemia: Secondary | ICD-10-CM | POA: Diagnosis not present

## 2014-01-14 DIAGNOSIS — E785 Hyperlipidemia, unspecified: Secondary | ICD-10-CM | POA: Diagnosis not present

## 2014-01-14 DIAGNOSIS — M5031 Other cervical disc degeneration,  high cervical region: Secondary | ICD-10-CM | POA: Diagnosis not present

## 2014-01-14 DIAGNOSIS — Z0000 Encounter for general adult medical examination without abnormal findings: Secondary | ICD-10-CM | POA: Diagnosis not present

## 2014-01-14 DIAGNOSIS — E118 Type 2 diabetes mellitus with unspecified complications: Secondary | ICD-10-CM | POA: Diagnosis not present

## 2014-01-14 DIAGNOSIS — M62838 Other muscle spasm: Secondary | ICD-10-CM | POA: Diagnosis not present

## 2014-01-14 DIAGNOSIS — E119 Type 2 diabetes mellitus without complications: Secondary | ICD-10-CM | POA: Diagnosis not present

## 2014-01-19 DIAGNOSIS — N401 Enlarged prostate with lower urinary tract symptoms: Secondary | ICD-10-CM | POA: Diagnosis not present

## 2014-01-19 DIAGNOSIS — R079 Chest pain, unspecified: Secondary | ICD-10-CM | POA: Diagnosis not present

## 2014-01-19 DIAGNOSIS — M79672 Pain in left foot: Secondary | ICD-10-CM | POA: Diagnosis not present

## 2014-01-19 DIAGNOSIS — M79671 Pain in right foot: Secondary | ICD-10-CM | POA: Diagnosis not present

## 2014-01-19 DIAGNOSIS — I10 Essential (primary) hypertension: Secondary | ICD-10-CM | POA: Diagnosis not present

## 2014-01-19 DIAGNOSIS — Z1212 Encounter for screening for malignant neoplasm of rectum: Secondary | ICD-10-CM | POA: Diagnosis not present

## 2014-01-19 DIAGNOSIS — Z791 Long term (current) use of non-steroidal anti-inflammatories (NSAID): Secondary | ICD-10-CM | POA: Diagnosis not present

## 2014-01-19 DIAGNOSIS — R0789 Other chest pain: Secondary | ICD-10-CM | POA: Diagnosis not present

## 2014-02-02 DIAGNOSIS — M5031 Other cervical disc degeneration,  high cervical region: Secondary | ICD-10-CM | POA: Diagnosis not present

## 2014-02-02 DIAGNOSIS — M62838 Other muscle spasm: Secondary | ICD-10-CM | POA: Diagnosis not present

## 2014-02-02 DIAGNOSIS — M9901 Segmental and somatic dysfunction of cervical region: Secondary | ICD-10-CM | POA: Diagnosis not present

## 2014-02-04 DIAGNOSIS — M5031 Other cervical disc degeneration,  high cervical region: Secondary | ICD-10-CM | POA: Diagnosis not present

## 2014-02-04 DIAGNOSIS — M9901 Segmental and somatic dysfunction of cervical region: Secondary | ICD-10-CM | POA: Diagnosis not present

## 2014-02-04 DIAGNOSIS — M62838 Other muscle spasm: Secondary | ICD-10-CM | POA: Diagnosis not present

## 2014-02-05 DIAGNOSIS — E119 Type 2 diabetes mellitus without complications: Secondary | ICD-10-CM | POA: Diagnosis not present

## 2014-02-05 DIAGNOSIS — Z961 Presence of intraocular lens: Secondary | ICD-10-CM | POA: Diagnosis not present

## 2014-02-09 DIAGNOSIS — K589 Irritable bowel syndrome without diarrhea: Secondary | ICD-10-CM | POA: Diagnosis not present

## 2014-02-09 DIAGNOSIS — K219 Gastro-esophageal reflux disease without esophagitis: Secondary | ICD-10-CM | POA: Diagnosis not present

## 2014-02-10 DIAGNOSIS — M5031 Other cervical disc degeneration,  high cervical region: Secondary | ICD-10-CM | POA: Diagnosis not present

## 2014-02-10 DIAGNOSIS — M62838 Other muscle spasm: Secondary | ICD-10-CM | POA: Diagnosis not present

## 2014-02-10 DIAGNOSIS — M9901 Segmental and somatic dysfunction of cervical region: Secondary | ICD-10-CM | POA: Diagnosis not present

## 2014-02-11 DIAGNOSIS — L821 Other seborrheic keratosis: Secondary | ICD-10-CM | POA: Diagnosis not present

## 2014-02-11 DIAGNOSIS — R21 Rash and other nonspecific skin eruption: Secondary | ICD-10-CM | POA: Diagnosis not present

## 2014-02-11 DIAGNOSIS — M5031 Other cervical disc degeneration,  high cervical region: Secondary | ICD-10-CM | POA: Diagnosis not present

## 2014-02-11 DIAGNOSIS — M9901 Segmental and somatic dysfunction of cervical region: Secondary | ICD-10-CM | POA: Diagnosis not present

## 2014-02-11 DIAGNOSIS — M62838 Other muscle spasm: Secondary | ICD-10-CM | POA: Diagnosis not present

## 2014-02-18 DIAGNOSIS — M62838 Other muscle spasm: Secondary | ICD-10-CM | POA: Diagnosis not present

## 2014-02-18 DIAGNOSIS — M9901 Segmental and somatic dysfunction of cervical region: Secondary | ICD-10-CM | POA: Diagnosis not present

## 2014-02-18 DIAGNOSIS — M5031 Other cervical disc degeneration,  high cervical region: Secondary | ICD-10-CM | POA: Diagnosis not present

## 2014-02-23 DIAGNOSIS — M62838 Other muscle spasm: Secondary | ICD-10-CM | POA: Diagnosis not present

## 2014-02-23 DIAGNOSIS — M5031 Other cervical disc degeneration,  high cervical region: Secondary | ICD-10-CM | POA: Diagnosis not present

## 2014-02-23 DIAGNOSIS — M9901 Segmental and somatic dysfunction of cervical region: Secondary | ICD-10-CM | POA: Diagnosis not present

## 2014-02-24 DIAGNOSIS — R21 Rash and other nonspecific skin eruption: Secondary | ICD-10-CM | POA: Diagnosis not present

## 2014-02-24 DIAGNOSIS — L821 Other seborrheic keratosis: Secondary | ICD-10-CM | POA: Diagnosis not present

## 2014-02-27 DIAGNOSIS — M5031 Other cervical disc degeneration,  high cervical region: Secondary | ICD-10-CM | POA: Diagnosis not present

## 2014-02-27 DIAGNOSIS — M62838 Other muscle spasm: Secondary | ICD-10-CM | POA: Diagnosis not present

## 2014-02-27 DIAGNOSIS — M9901 Segmental and somatic dysfunction of cervical region: Secondary | ICD-10-CM | POA: Diagnosis not present

## 2014-03-05 ENCOUNTER — Other Ambulatory Visit (HOSPITAL_COMMUNITY): Payer: Self-pay | Admitting: *Deleted

## 2014-03-05 DIAGNOSIS — M62838 Other muscle spasm: Secondary | ICD-10-CM | POA: Diagnosis not present

## 2014-03-05 DIAGNOSIS — M9901 Segmental and somatic dysfunction of cervical region: Secondary | ICD-10-CM | POA: Diagnosis not present

## 2014-03-05 DIAGNOSIS — K219 Gastro-esophageal reflux disease without esophagitis: Secondary | ICD-10-CM | POA: Diagnosis not present

## 2014-03-05 DIAGNOSIS — M5031 Other cervical disc degeneration,  high cervical region: Secondary | ICD-10-CM | POA: Diagnosis not present

## 2014-03-06 ENCOUNTER — Ambulatory Visit (HOSPITAL_COMMUNITY)
Admission: RE | Admit: 2014-03-06 | Discharge: 2014-03-06 | Disposition: A | Discharge status: Home / Self Care | Payer: Medicare Other | Source: Ambulatory Visit | Attending: Gastroenterology | Admitting: Gastroenterology

## 2014-03-06 LAB — CBC WITH DIFFERENTIAL/PLATELET
BASOS PCT: 1 % (ref 0–1)
Basophils Absolute: 0 10*3/uL (ref 0.0–0.1)
EOS ABS: 0.3 10*3/uL (ref 0.0–0.7)
Eosinophils Relative: 4 % (ref 0–5)
HEMATOCRIT: 43.1 % (ref 39.0–52.0)
HEMOGLOBIN: 14.8 g/dL (ref 13.0–17.0)
LYMPHS ABS: 1.3 10*3/uL (ref 0.7–4.0)
Lymphocytes Relative: 21 % (ref 12–46)
MCH: 31.8 pg (ref 26.0–34.0)
MCHC: 34.3 g/dL (ref 30.0–36.0)
MCV: 92.5 fL (ref 78.0–100.0)
MONO ABS: 0.4 10*3/uL (ref 0.1–1.0)
MONOS PCT: 6 % (ref 3–12)
NEUTROS ABS: 4.4 10*3/uL (ref 1.7–7.7)
Neutrophils Relative %: 68 % (ref 43–77)
Platelets: 166 10*3/uL (ref 150–400)
RBC: 4.66 MIL/uL (ref 4.22–5.81)
RDW: 12.1 % (ref 11.5–15.5)
WBC: 6.4 10*3/uL (ref 4.0–10.5)

## 2014-03-06 NOTE — Progress Notes (Signed)
Pt came in today for a scheduled therapeutic phlebotomy.  Left forearm was used.  500 cc of blood was removed.  Pt tolerated procedure well.  CBC with Diff was drawn prior to procedure to verify HGB. Per MD order

## 2014-03-09 DIAGNOSIS — M9901 Segmental and somatic dysfunction of cervical region: Secondary | ICD-10-CM | POA: Diagnosis not present

## 2014-03-09 DIAGNOSIS — M5031 Other cervical disc degeneration,  high cervical region: Secondary | ICD-10-CM | POA: Diagnosis not present

## 2014-03-09 DIAGNOSIS — M62838 Other muscle spasm: Secondary | ICD-10-CM | POA: Diagnosis not present

## 2014-03-11 DIAGNOSIS — L309 Dermatitis, unspecified: Secondary | ICD-10-CM | POA: Diagnosis not present

## 2014-03-16 DIAGNOSIS — H6120 Impacted cerumen, unspecified ear: Secondary | ICD-10-CM | POA: Diagnosis not present

## 2014-03-16 DIAGNOSIS — M62838 Other muscle spasm: Secondary | ICD-10-CM | POA: Diagnosis not present

## 2014-03-16 DIAGNOSIS — H9190 Unspecified hearing loss, unspecified ear: Secondary | ICD-10-CM | POA: Diagnosis not present

## 2014-03-16 DIAGNOSIS — M5031 Other cervical disc degeneration,  high cervical region: Secondary | ICD-10-CM | POA: Diagnosis not present

## 2014-03-16 DIAGNOSIS — M9901 Segmental and somatic dysfunction of cervical region: Secondary | ICD-10-CM | POA: Diagnosis not present

## 2014-03-23 DIAGNOSIS — M9901 Segmental and somatic dysfunction of cervical region: Secondary | ICD-10-CM | POA: Diagnosis not present

## 2014-03-23 DIAGNOSIS — M5031 Other cervical disc degeneration,  high cervical region: Secondary | ICD-10-CM | POA: Diagnosis not present

## 2014-03-23 DIAGNOSIS — M62838 Other muscle spasm: Secondary | ICD-10-CM | POA: Diagnosis not present

## 2014-03-31 DIAGNOSIS — E785 Hyperlipidemia, unspecified: Secondary | ICD-10-CM | POA: Diagnosis not present

## 2014-03-31 DIAGNOSIS — E119 Type 2 diabetes mellitus without complications: Secondary | ICD-10-CM | POA: Diagnosis not present

## 2014-03-31 DIAGNOSIS — I10 Essential (primary) hypertension: Secondary | ICD-10-CM | POA: Diagnosis not present

## 2014-04-02 DIAGNOSIS — I10 Essential (primary) hypertension: Secondary | ICD-10-CM | POA: Diagnosis not present

## 2014-04-02 DIAGNOSIS — E785 Hyperlipidemia, unspecified: Secondary | ICD-10-CM | POA: Diagnosis not present

## 2014-04-02 DIAGNOSIS — E119 Type 2 diabetes mellitus without complications: Secondary | ICD-10-CM | POA: Diagnosis not present

## 2014-04-06 ENCOUNTER — Other Ambulatory Visit (HOSPITAL_COMMUNITY): Payer: Self-pay | Admitting: *Deleted

## 2014-04-07 ENCOUNTER — Ambulatory Visit (HOSPITAL_COMMUNITY)
Admission: RE | Admit: 2014-04-07 | Discharge: 2014-04-07 | Disposition: A | Discharge status: Home / Self Care | Payer: Medicare Other | Source: Ambulatory Visit | Attending: Obstetrics and Gynecology | Admitting: Obstetrics and Gynecology

## 2014-04-07 ENCOUNTER — Other Ambulatory Visit (HOSPITAL_COMMUNITY): Payer: Self-pay | Admitting: *Deleted

## 2014-04-07 LAB — CBC
HEMATOCRIT: 43.7 % (ref 39.0–52.0)
HEMOGLOBIN: 15.1 g/dL (ref 13.0–17.0)
MCH: 32.1 pg (ref 26.0–34.0)
MCHC: 34.6 g/dL (ref 30.0–36.0)
MCV: 92.8 fL (ref 78.0–100.0)
Platelets: 200 10*3/uL (ref 150–400)
RBC: 4.71 MIL/uL (ref 4.22–5.81)
RDW: 12.1 % (ref 11.5–15.5)
WBC: 8.3 10*3/uL (ref 4.0–10.5)

## 2014-04-07 LAB — DIFFERENTIAL
BASOS PCT: 0 % (ref 0–1)
Basophils Absolute: 0 10*3/uL (ref 0.0–0.1)
EOS PCT: 1 % (ref 0–5)
Eosinophils Absolute: 0.1 10*3/uL (ref 0.0–0.7)
Lymphocytes Relative: 12 % (ref 12–46)
Lymphs Abs: 1 10*3/uL (ref 0.7–4.0)
MONO ABS: 0.4 10*3/uL (ref 0.1–1.0)
MONOS PCT: 5 % (ref 3–12)
NEUTROS ABS: 6.8 10*3/uL (ref 1.7–7.7)
Neutrophils Relative %: 82 % — ABNORMAL HIGH (ref 43–77)

## 2014-04-07 NOTE — Progress Notes (Signed)
Patient's Hgb 15.1.  Patient phlebotomized 500cc of blood waste via left lateral forearm.  Patient tolerated well.  Will continue to monitor.

## 2014-04-24 DIAGNOSIS — M5031 Other cervical disc degeneration,  high cervical region: Secondary | ICD-10-CM | POA: Diagnosis not present

## 2014-04-24 DIAGNOSIS — M9901 Segmental and somatic dysfunction of cervical region: Secondary | ICD-10-CM | POA: Diagnosis not present

## 2014-04-24 DIAGNOSIS — M62838 Other muscle spasm: Secondary | ICD-10-CM | POA: Diagnosis not present

## 2014-04-28 DIAGNOSIS — M5031 Other cervical disc degeneration,  high cervical region: Secondary | ICD-10-CM | POA: Diagnosis not present

## 2014-04-28 DIAGNOSIS — M62838 Other muscle spasm: Secondary | ICD-10-CM | POA: Diagnosis not present

## 2014-04-28 DIAGNOSIS — M9901 Segmental and somatic dysfunction of cervical region: Secondary | ICD-10-CM | POA: Diagnosis not present

## 2014-05-05 ENCOUNTER — Encounter (HOSPITAL_COMMUNITY)
Admission: RE | Admit: 2014-05-05 | Discharge: 2014-05-05 | Disposition: A | Discharge status: Home / Self Care | Payer: Medicare Other | Source: Ambulatory Visit | Attending: Obstetrics and Gynecology | Admitting: Obstetrics and Gynecology

## 2014-05-05 LAB — CBC WITH DIFFERENTIAL/PLATELET
Basophils Absolute: 0 10*3/uL (ref 0.0–0.1)
Basophils Relative: 1 % (ref 0–1)
Eosinophils Absolute: 0.2 10*3/uL (ref 0.0–0.7)
Eosinophils Relative: 4 % (ref 0–5)
HCT: 41.1 % (ref 39.0–52.0)
Hemoglobin: 14.2 g/dL (ref 13.0–17.0)
LYMPHS ABS: 1.1 10*3/uL (ref 0.7–4.0)
LYMPHS PCT: 21 % (ref 12–46)
MCH: 32.1 pg (ref 26.0–34.0)
MCHC: 34.5 g/dL (ref 30.0–36.0)
MCV: 93 fL (ref 78.0–100.0)
MONOS PCT: 6 % (ref 3–12)
Monocytes Absolute: 0.3 10*3/uL (ref 0.1–1.0)
NEUTROS PCT: 68 % (ref 43–77)
Neutro Abs: 3.7 10*3/uL (ref 1.7–7.7)
PLATELETS: 165 10*3/uL (ref 150–400)
RBC: 4.42 MIL/uL (ref 4.22–5.81)
RDW: 12.1 % (ref 11.5–15.5)
WBC: 5.4 10*3/uL (ref 4.0–10.5)

## 2014-05-05 NOTE — Progress Notes (Signed)
500 ml phlebotomy completed using left forearm; pt tolerated well

## 2014-05-19 DIAGNOSIS — J069 Acute upper respiratory infection, unspecified: Secondary | ICD-10-CM | POA: Diagnosis not present

## 2014-06-19 DIAGNOSIS — M62838 Other muscle spasm: Secondary | ICD-10-CM | POA: Diagnosis not present

## 2014-06-19 DIAGNOSIS — M9901 Segmental and somatic dysfunction of cervical region: Secondary | ICD-10-CM | POA: Diagnosis not present

## 2014-06-19 DIAGNOSIS — M5031 Other cervical disc degeneration,  high cervical region: Secondary | ICD-10-CM | POA: Diagnosis not present

## 2014-06-24 DIAGNOSIS — M5031 Other cervical disc degeneration,  high cervical region: Secondary | ICD-10-CM | POA: Diagnosis not present

## 2014-06-24 DIAGNOSIS — M9901 Segmental and somatic dysfunction of cervical region: Secondary | ICD-10-CM | POA: Diagnosis not present

## 2014-06-24 DIAGNOSIS — M62838 Other muscle spasm: Secondary | ICD-10-CM | POA: Diagnosis not present

## 2014-06-29 DIAGNOSIS — M5031 Other cervical disc degeneration,  high cervical region: Secondary | ICD-10-CM | POA: Diagnosis not present

## 2014-06-29 DIAGNOSIS — M9901 Segmental and somatic dysfunction of cervical region: Secondary | ICD-10-CM | POA: Diagnosis not present

## 2014-06-29 DIAGNOSIS — M62838 Other muscle spasm: Secondary | ICD-10-CM | POA: Diagnosis not present

## 2014-06-30 DIAGNOSIS — E1165 Type 2 diabetes mellitus with hyperglycemia: Secondary | ICD-10-CM | POA: Diagnosis not present

## 2014-06-30 DIAGNOSIS — E785 Hyperlipidemia, unspecified: Secondary | ICD-10-CM | POA: Diagnosis not present

## 2014-06-30 DIAGNOSIS — I10 Essential (primary) hypertension: Secondary | ICD-10-CM | POA: Diagnosis not present

## 2014-06-30 DIAGNOSIS — E119 Type 2 diabetes mellitus without complications: Secondary | ICD-10-CM | POA: Diagnosis not present

## 2014-07-02 DIAGNOSIS — E785 Hyperlipidemia, unspecified: Secondary | ICD-10-CM | POA: Diagnosis not present

## 2014-07-02 DIAGNOSIS — M9901 Segmental and somatic dysfunction of cervical region: Secondary | ICD-10-CM | POA: Diagnosis not present

## 2014-07-02 DIAGNOSIS — E119 Type 2 diabetes mellitus without complications: Secondary | ICD-10-CM | POA: Diagnosis not present

## 2014-07-02 DIAGNOSIS — M5031 Other cervical disc degeneration,  high cervical region: Secondary | ICD-10-CM | POA: Diagnosis not present

## 2014-07-02 DIAGNOSIS — M62838 Other muscle spasm: Secondary | ICD-10-CM | POA: Diagnosis not present

## 2014-07-02 DIAGNOSIS — I10 Essential (primary) hypertension: Secondary | ICD-10-CM | POA: Diagnosis not present

## 2014-07-08 DIAGNOSIS — M9901 Segmental and somatic dysfunction of cervical region: Secondary | ICD-10-CM | POA: Diagnosis not present

## 2014-07-08 DIAGNOSIS — M5031 Other cervical disc degeneration,  high cervical region: Secondary | ICD-10-CM | POA: Diagnosis not present

## 2014-07-08 DIAGNOSIS — M62838 Other muscle spasm: Secondary | ICD-10-CM | POA: Diagnosis not present

## 2014-07-13 DIAGNOSIS — M9901 Segmental and somatic dysfunction of cervical region: Secondary | ICD-10-CM | POA: Diagnosis not present

## 2014-07-13 DIAGNOSIS — M62838 Other muscle spasm: Secondary | ICD-10-CM | POA: Diagnosis not present

## 2014-07-13 DIAGNOSIS — M5031 Other cervical disc degeneration,  high cervical region: Secondary | ICD-10-CM | POA: Diagnosis not present

## 2014-07-22 DIAGNOSIS — M9901 Segmental and somatic dysfunction of cervical region: Secondary | ICD-10-CM | POA: Diagnosis not present

## 2014-07-22 DIAGNOSIS — M62838 Other muscle spasm: Secondary | ICD-10-CM | POA: Diagnosis not present

## 2014-07-22 DIAGNOSIS — M5031 Other cervical disc degeneration,  high cervical region: Secondary | ICD-10-CM | POA: Diagnosis not present

## 2014-08-03 DIAGNOSIS — M9903 Segmental and somatic dysfunction of lumbar region: Secondary | ICD-10-CM | POA: Diagnosis not present

## 2014-08-03 DIAGNOSIS — M9904 Segmental and somatic dysfunction of sacral region: Secondary | ICD-10-CM | POA: Diagnosis not present

## 2014-08-03 DIAGNOSIS — M5136 Other intervertebral disc degeneration, lumbar region: Secondary | ICD-10-CM | POA: Diagnosis not present

## 2014-08-03 DIAGNOSIS — M9905 Segmental and somatic dysfunction of pelvic region: Secondary | ICD-10-CM | POA: Diagnosis not present

## 2014-08-04 DIAGNOSIS — M9901 Segmental and somatic dysfunction of cervical region: Secondary | ICD-10-CM | POA: Diagnosis not present

## 2014-08-04 DIAGNOSIS — M62838 Other muscle spasm: Secondary | ICD-10-CM | POA: Diagnosis not present

## 2014-08-04 DIAGNOSIS — M5031 Other cervical disc degeneration,  high cervical region: Secondary | ICD-10-CM | POA: Diagnosis not present

## 2014-08-06 DIAGNOSIS — M62838 Other muscle spasm: Secondary | ICD-10-CM | POA: Diagnosis not present

## 2014-08-06 DIAGNOSIS — M5031 Other cervical disc degeneration,  high cervical region: Secondary | ICD-10-CM | POA: Diagnosis not present

## 2014-08-06 DIAGNOSIS — M9901 Segmental and somatic dysfunction of cervical region: Secondary | ICD-10-CM | POA: Diagnosis not present

## 2014-08-10 DIAGNOSIS — M9901 Segmental and somatic dysfunction of cervical region: Secondary | ICD-10-CM | POA: Diagnosis not present

## 2014-08-10 DIAGNOSIS — M62838 Other muscle spasm: Secondary | ICD-10-CM | POA: Diagnosis not present

## 2014-08-10 DIAGNOSIS — M5031 Other cervical disc degeneration,  high cervical region: Secondary | ICD-10-CM | POA: Diagnosis not present

## 2014-08-25 DIAGNOSIS — M4316 Spondylolisthesis, lumbar region: Secondary | ICD-10-CM | POA: Diagnosis not present

## 2014-08-25 DIAGNOSIS — M79605 Pain in left leg: Secondary | ICD-10-CM | POA: Diagnosis not present

## 2014-08-28 DIAGNOSIS — M47816 Spondylosis without myelopathy or radiculopathy, lumbar region: Secondary | ICD-10-CM | POA: Diagnosis not present

## 2014-08-28 DIAGNOSIS — M5416 Radiculopathy, lumbar region: Secondary | ICD-10-CM | POA: Diagnosis not present

## 2014-09-14 DIAGNOSIS — M5031 Other cervical disc degeneration,  high cervical region: Secondary | ICD-10-CM | POA: Diagnosis not present

## 2014-09-14 DIAGNOSIS — M62838 Other muscle spasm: Secondary | ICD-10-CM | POA: Diagnosis not present

## 2014-09-14 DIAGNOSIS — M9901 Segmental and somatic dysfunction of cervical region: Secondary | ICD-10-CM | POA: Diagnosis not present

## 2014-09-16 DIAGNOSIS — I788 Other diseases of capillaries: Secondary | ICD-10-CM | POA: Diagnosis not present

## 2014-09-16 DIAGNOSIS — D1801 Hemangioma of skin and subcutaneous tissue: Secondary | ICD-10-CM | POA: Diagnosis not present

## 2014-09-16 DIAGNOSIS — L821 Other seborrheic keratosis: Secondary | ICD-10-CM | POA: Diagnosis not present

## 2014-09-16 DIAGNOSIS — L814 Other melanin hyperpigmentation: Secondary | ICD-10-CM | POA: Diagnosis not present

## 2014-09-23 DIAGNOSIS — M9901 Segmental and somatic dysfunction of cervical region: Secondary | ICD-10-CM | POA: Diagnosis not present

## 2014-09-23 DIAGNOSIS — M62838 Other muscle spasm: Secondary | ICD-10-CM | POA: Diagnosis not present

## 2014-09-23 DIAGNOSIS — M5031 Other cervical disc degeneration,  high cervical region: Secondary | ICD-10-CM | POA: Diagnosis not present

## 2014-09-28 DIAGNOSIS — M9901 Segmental and somatic dysfunction of cervical region: Secondary | ICD-10-CM | POA: Diagnosis not present

## 2014-09-28 DIAGNOSIS — M5031 Other cervical disc degeneration,  high cervical region: Secondary | ICD-10-CM | POA: Diagnosis not present

## 2014-09-28 DIAGNOSIS — M62838 Other muscle spasm: Secondary | ICD-10-CM | POA: Diagnosis not present

## 2014-09-29 DIAGNOSIS — E785 Hyperlipidemia, unspecified: Secondary | ICD-10-CM | POA: Diagnosis not present

## 2014-09-29 DIAGNOSIS — I10 Essential (primary) hypertension: Secondary | ICD-10-CM | POA: Diagnosis not present

## 2014-09-29 DIAGNOSIS — E119 Type 2 diabetes mellitus without complications: Secondary | ICD-10-CM | POA: Diagnosis not present

## 2014-10-01 DIAGNOSIS — E119 Type 2 diabetes mellitus without complications: Secondary | ICD-10-CM | POA: Diagnosis not present

## 2014-10-01 DIAGNOSIS — E785 Hyperlipidemia, unspecified: Secondary | ICD-10-CM | POA: Diagnosis not present

## 2014-10-01 DIAGNOSIS — I10 Essential (primary) hypertension: Secondary | ICD-10-CM | POA: Diagnosis not present

## 2014-10-05 DIAGNOSIS — M62838 Other muscle spasm: Secondary | ICD-10-CM | POA: Diagnosis not present

## 2014-10-05 DIAGNOSIS — M5031 Other cervical disc degeneration,  high cervical region: Secondary | ICD-10-CM | POA: Diagnosis not present

## 2014-10-05 DIAGNOSIS — M9901 Segmental and somatic dysfunction of cervical region: Secondary | ICD-10-CM | POA: Diagnosis not present

## 2014-10-12 DIAGNOSIS — M5031 Other cervical disc degeneration,  high cervical region: Secondary | ICD-10-CM | POA: Diagnosis not present

## 2014-10-12 DIAGNOSIS — M9901 Segmental and somatic dysfunction of cervical region: Secondary | ICD-10-CM | POA: Diagnosis not present

## 2014-10-12 DIAGNOSIS — M62838 Other muscle spasm: Secondary | ICD-10-CM | POA: Diagnosis not present

## 2014-10-19 DIAGNOSIS — M62838 Other muscle spasm: Secondary | ICD-10-CM | POA: Diagnosis not present

## 2014-10-19 DIAGNOSIS — M5031 Other cervical disc degeneration,  high cervical region: Secondary | ICD-10-CM | POA: Diagnosis not present

## 2014-10-19 DIAGNOSIS — M9901 Segmental and somatic dysfunction of cervical region: Secondary | ICD-10-CM | POA: Diagnosis not present

## 2014-10-29 DIAGNOSIS — E785 Hyperlipidemia, unspecified: Secondary | ICD-10-CM | POA: Diagnosis not present

## 2014-10-29 DIAGNOSIS — I10 Essential (primary) hypertension: Secondary | ICD-10-CM | POA: Diagnosis not present

## 2014-10-29 DIAGNOSIS — Z23 Encounter for immunization: Secondary | ICD-10-CM | POA: Diagnosis not present

## 2014-10-29 DIAGNOSIS — E119 Type 2 diabetes mellitus without complications: Secondary | ICD-10-CM | POA: Diagnosis not present

## 2014-11-09 DIAGNOSIS — M25512 Pain in left shoulder: Secondary | ICD-10-CM | POA: Diagnosis not present

## 2014-12-29 DIAGNOSIS — E785 Hyperlipidemia, unspecified: Secondary | ICD-10-CM | POA: Diagnosis not present

## 2014-12-29 DIAGNOSIS — E119 Type 2 diabetes mellitus without complications: Secondary | ICD-10-CM | POA: Diagnosis not present

## 2014-12-31 DIAGNOSIS — I10 Essential (primary) hypertension: Secondary | ICD-10-CM | POA: Diagnosis not present

## 2014-12-31 DIAGNOSIS — E119 Type 2 diabetes mellitus without complications: Secondary | ICD-10-CM | POA: Diagnosis not present

## 2014-12-31 DIAGNOSIS — E785 Hyperlipidemia, unspecified: Secondary | ICD-10-CM | POA: Diagnosis not present

## 2015-01-13 DIAGNOSIS — M542 Cervicalgia: Secondary | ICD-10-CM | POA: Diagnosis not present

## 2015-01-13 DIAGNOSIS — M9901 Segmental and somatic dysfunction of cervical region: Secondary | ICD-10-CM | POA: Diagnosis not present

## 2015-01-13 DIAGNOSIS — M9902 Segmental and somatic dysfunction of thoracic region: Secondary | ICD-10-CM | POA: Diagnosis not present

## 2015-01-13 DIAGNOSIS — M50322 Other cervical disc degeneration at C5-C6 level: Secondary | ICD-10-CM | POA: Diagnosis not present

## 2015-01-19 DIAGNOSIS — M50322 Other cervical disc degeneration at C5-C6 level: Secondary | ICD-10-CM | POA: Diagnosis not present

## 2015-01-19 DIAGNOSIS — M9901 Segmental and somatic dysfunction of cervical region: Secondary | ICD-10-CM | POA: Diagnosis not present

## 2015-01-19 DIAGNOSIS — M542 Cervicalgia: Secondary | ICD-10-CM | POA: Diagnosis not present

## 2015-01-19 DIAGNOSIS — M9902 Segmental and somatic dysfunction of thoracic region: Secondary | ICD-10-CM | POA: Diagnosis not present

## 2015-01-20 DIAGNOSIS — K219 Gastro-esophageal reflux disease without esophagitis: Secondary | ICD-10-CM | POA: Diagnosis not present

## 2015-01-20 DIAGNOSIS — K589 Irritable bowel syndrome without diarrhea: Secondary | ICD-10-CM | POA: Diagnosis not present

## 2015-01-27 DIAGNOSIS — M542 Cervicalgia: Secondary | ICD-10-CM | POA: Diagnosis not present

## 2015-01-27 DIAGNOSIS — M50322 Other cervical disc degeneration at C5-C6 level: Secondary | ICD-10-CM | POA: Diagnosis not present

## 2015-01-27 DIAGNOSIS — M9902 Segmental and somatic dysfunction of thoracic region: Secondary | ICD-10-CM | POA: Diagnosis not present

## 2015-01-27 DIAGNOSIS — M9901 Segmental and somatic dysfunction of cervical region: Secondary | ICD-10-CM | POA: Diagnosis not present

## 2015-02-01 DIAGNOSIS — M50322 Other cervical disc degeneration at C5-C6 level: Secondary | ICD-10-CM | POA: Diagnosis not present

## 2015-02-01 DIAGNOSIS — M9902 Segmental and somatic dysfunction of thoracic region: Secondary | ICD-10-CM | POA: Diagnosis not present

## 2015-02-01 DIAGNOSIS — M542 Cervicalgia: Secondary | ICD-10-CM | POA: Diagnosis not present

## 2015-02-01 DIAGNOSIS — M9901 Segmental and somatic dysfunction of cervical region: Secondary | ICD-10-CM | POA: Diagnosis not present

## 2015-02-04 DIAGNOSIS — Z961 Presence of intraocular lens: Secondary | ICD-10-CM | POA: Diagnosis not present

## 2015-02-04 DIAGNOSIS — E119 Type 2 diabetes mellitus without complications: Secondary | ICD-10-CM | POA: Diagnosis not present

## 2015-02-08 DIAGNOSIS — M9901 Segmental and somatic dysfunction of cervical region: Secondary | ICD-10-CM | POA: Diagnosis not present

## 2015-02-08 DIAGNOSIS — M542 Cervicalgia: Secondary | ICD-10-CM | POA: Diagnosis not present

## 2015-02-08 DIAGNOSIS — M9902 Segmental and somatic dysfunction of thoracic region: Secondary | ICD-10-CM | POA: Diagnosis not present

## 2015-02-08 DIAGNOSIS — M50322 Other cervical disc degeneration at C5-C6 level: Secondary | ICD-10-CM | POA: Diagnosis not present

## 2015-02-15 DIAGNOSIS — M9902 Segmental and somatic dysfunction of thoracic region: Secondary | ICD-10-CM | POA: Diagnosis not present

## 2015-02-15 DIAGNOSIS — M50322 Other cervical disc degeneration at C5-C6 level: Secondary | ICD-10-CM | POA: Diagnosis not present

## 2015-02-15 DIAGNOSIS — M542 Cervicalgia: Secondary | ICD-10-CM | POA: Diagnosis not present

## 2015-02-15 DIAGNOSIS — M9901 Segmental and somatic dysfunction of cervical region: Secondary | ICD-10-CM | POA: Diagnosis not present

## 2015-02-22 DIAGNOSIS — M50322 Other cervical disc degeneration at C5-C6 level: Secondary | ICD-10-CM | POA: Diagnosis not present

## 2015-02-22 DIAGNOSIS — M9901 Segmental and somatic dysfunction of cervical region: Secondary | ICD-10-CM | POA: Diagnosis not present

## 2015-02-22 DIAGNOSIS — M542 Cervicalgia: Secondary | ICD-10-CM | POA: Diagnosis not present

## 2015-02-22 DIAGNOSIS — M9902 Segmental and somatic dysfunction of thoracic region: Secondary | ICD-10-CM | POA: Diagnosis not present

## 2015-03-01 DIAGNOSIS — M9901 Segmental and somatic dysfunction of cervical region: Secondary | ICD-10-CM | POA: Diagnosis not present

## 2015-03-01 DIAGNOSIS — M542 Cervicalgia: Secondary | ICD-10-CM | POA: Diagnosis not present

## 2015-03-01 DIAGNOSIS — M9902 Segmental and somatic dysfunction of thoracic region: Secondary | ICD-10-CM | POA: Diagnosis not present

## 2015-03-01 DIAGNOSIS — M50322 Other cervical disc degeneration at C5-C6 level: Secondary | ICD-10-CM | POA: Diagnosis not present

## 2015-03-15 DIAGNOSIS — M9902 Segmental and somatic dysfunction of thoracic region: Secondary | ICD-10-CM | POA: Diagnosis not present

## 2015-03-15 DIAGNOSIS — M50322 Other cervical disc degeneration at C5-C6 level: Secondary | ICD-10-CM | POA: Diagnosis not present

## 2015-03-15 DIAGNOSIS — M9901 Segmental and somatic dysfunction of cervical region: Secondary | ICD-10-CM | POA: Diagnosis not present

## 2015-03-15 DIAGNOSIS — M542 Cervicalgia: Secondary | ICD-10-CM | POA: Diagnosis not present

## 2015-04-06 DIAGNOSIS — E119 Type 2 diabetes mellitus without complications: Secondary | ICD-10-CM | POA: Diagnosis not present

## 2015-04-06 DIAGNOSIS — I10 Essential (primary) hypertension: Secondary | ICD-10-CM | POA: Diagnosis not present

## 2015-04-06 DIAGNOSIS — E785 Hyperlipidemia, unspecified: Secondary | ICD-10-CM | POA: Diagnosis not present

## 2015-04-08 DIAGNOSIS — E119 Type 2 diabetes mellitus without complications: Secondary | ICD-10-CM | POA: Diagnosis not present

## 2015-04-08 DIAGNOSIS — I10 Essential (primary) hypertension: Secondary | ICD-10-CM | POA: Diagnosis not present

## 2015-04-08 DIAGNOSIS — E785 Hyperlipidemia, unspecified: Secondary | ICD-10-CM | POA: Diagnosis not present

## 2015-04-15 DIAGNOSIS — L304 Erythema intertrigo: Secondary | ICD-10-CM | POA: Diagnosis not present

## 2015-04-15 DIAGNOSIS — L821 Other seborrheic keratosis: Secondary | ICD-10-CM | POA: Diagnosis not present

## 2015-04-15 DIAGNOSIS — L570 Actinic keratosis: Secondary | ICD-10-CM | POA: Diagnosis not present

## 2015-04-26 DIAGNOSIS — M9904 Segmental and somatic dysfunction of sacral region: Secondary | ICD-10-CM | POA: Diagnosis not present

## 2015-04-26 DIAGNOSIS — M9901 Segmental and somatic dysfunction of cervical region: Secondary | ICD-10-CM | POA: Diagnosis not present

## 2015-04-26 DIAGNOSIS — M5136 Other intervertebral disc degeneration, lumbar region: Secondary | ICD-10-CM | POA: Diagnosis not present

## 2015-04-26 DIAGNOSIS — M9903 Segmental and somatic dysfunction of lumbar region: Secondary | ICD-10-CM | POA: Diagnosis not present

## 2015-05-03 DIAGNOSIS — M9903 Segmental and somatic dysfunction of lumbar region: Secondary | ICD-10-CM | POA: Diagnosis not present

## 2015-05-03 DIAGNOSIS — M9904 Segmental and somatic dysfunction of sacral region: Secondary | ICD-10-CM | POA: Diagnosis not present

## 2015-05-03 DIAGNOSIS — M5136 Other intervertebral disc degeneration, lumbar region: Secondary | ICD-10-CM | POA: Diagnosis not present

## 2015-05-03 DIAGNOSIS — M9901 Segmental and somatic dysfunction of cervical region: Secondary | ICD-10-CM | POA: Diagnosis not present

## 2015-05-04 DIAGNOSIS — E119 Type 2 diabetes mellitus without complications: Secondary | ICD-10-CM | POA: Diagnosis not present

## 2015-05-10 DIAGNOSIS — M9903 Segmental and somatic dysfunction of lumbar region: Secondary | ICD-10-CM | POA: Diagnosis not present

## 2015-05-10 DIAGNOSIS — M9901 Segmental and somatic dysfunction of cervical region: Secondary | ICD-10-CM | POA: Diagnosis not present

## 2015-05-10 DIAGNOSIS — M5136 Other intervertebral disc degeneration, lumbar region: Secondary | ICD-10-CM | POA: Diagnosis not present

## 2015-05-10 DIAGNOSIS — M9904 Segmental and somatic dysfunction of sacral region: Secondary | ICD-10-CM | POA: Diagnosis not present

## 2015-05-17 DIAGNOSIS — M9904 Segmental and somatic dysfunction of sacral region: Secondary | ICD-10-CM | POA: Diagnosis not present

## 2015-05-17 DIAGNOSIS — M9903 Segmental and somatic dysfunction of lumbar region: Secondary | ICD-10-CM | POA: Diagnosis not present

## 2015-05-17 DIAGNOSIS — M9901 Segmental and somatic dysfunction of cervical region: Secondary | ICD-10-CM | POA: Diagnosis not present

## 2015-05-17 DIAGNOSIS — M5136 Other intervertebral disc degeneration, lumbar region: Secondary | ICD-10-CM | POA: Diagnosis not present

## 2015-05-21 DIAGNOSIS — M4726 Other spondylosis with radiculopathy, lumbar region: Secondary | ICD-10-CM | POA: Diagnosis not present

## 2015-05-21 DIAGNOSIS — M4806 Spinal stenosis, lumbar region: Secondary | ICD-10-CM | POA: Diagnosis not present

## 2015-05-21 DIAGNOSIS — M5416 Radiculopathy, lumbar region: Secondary | ICD-10-CM | POA: Diagnosis not present

## 2015-05-21 DIAGNOSIS — M5136 Other intervertebral disc degeneration, lumbar region: Secondary | ICD-10-CM | POA: Diagnosis not present

## 2015-05-24 DIAGNOSIS — M9903 Segmental and somatic dysfunction of lumbar region: Secondary | ICD-10-CM | POA: Diagnosis not present

## 2015-05-24 DIAGNOSIS — M5136 Other intervertebral disc degeneration, lumbar region: Secondary | ICD-10-CM | POA: Diagnosis not present

## 2015-05-24 DIAGNOSIS — M9904 Segmental and somatic dysfunction of sacral region: Secondary | ICD-10-CM | POA: Diagnosis not present

## 2015-05-24 DIAGNOSIS — M9901 Segmental and somatic dysfunction of cervical region: Secondary | ICD-10-CM | POA: Diagnosis not present

## 2015-05-31 DIAGNOSIS — M9904 Segmental and somatic dysfunction of sacral region: Secondary | ICD-10-CM | POA: Diagnosis not present

## 2015-05-31 DIAGNOSIS — M5136 Other intervertebral disc degeneration, lumbar region: Secondary | ICD-10-CM | POA: Diagnosis not present

## 2015-05-31 DIAGNOSIS — M9903 Segmental and somatic dysfunction of lumbar region: Secondary | ICD-10-CM | POA: Diagnosis not present

## 2015-05-31 DIAGNOSIS — M9901 Segmental and somatic dysfunction of cervical region: Secondary | ICD-10-CM | POA: Diagnosis not present

## 2015-06-07 DIAGNOSIS — M9904 Segmental and somatic dysfunction of sacral region: Secondary | ICD-10-CM | POA: Diagnosis not present

## 2015-06-07 DIAGNOSIS — M9903 Segmental and somatic dysfunction of lumbar region: Secondary | ICD-10-CM | POA: Diagnosis not present

## 2015-06-07 DIAGNOSIS — M5136 Other intervertebral disc degeneration, lumbar region: Secondary | ICD-10-CM | POA: Diagnosis not present

## 2015-06-07 DIAGNOSIS — M9901 Segmental and somatic dysfunction of cervical region: Secondary | ICD-10-CM | POA: Diagnosis not present

## 2015-06-10 DIAGNOSIS — L218 Other seborrheic dermatitis: Secondary | ICD-10-CM | POA: Diagnosis not present

## 2015-06-10 DIAGNOSIS — Z85828 Personal history of other malignant neoplasm of skin: Secondary | ICD-10-CM | POA: Diagnosis not present

## 2015-06-15 DIAGNOSIS — M9903 Segmental and somatic dysfunction of lumbar region: Secondary | ICD-10-CM | POA: Diagnosis not present

## 2015-06-15 DIAGNOSIS — M9904 Segmental and somatic dysfunction of sacral region: Secondary | ICD-10-CM | POA: Diagnosis not present

## 2015-06-15 DIAGNOSIS — M9901 Segmental and somatic dysfunction of cervical region: Secondary | ICD-10-CM | POA: Diagnosis not present

## 2015-06-15 DIAGNOSIS — M5136 Other intervertebral disc degeneration, lumbar region: Secondary | ICD-10-CM | POA: Diagnosis not present

## 2015-06-21 DIAGNOSIS — M5136 Other intervertebral disc degeneration, lumbar region: Secondary | ICD-10-CM | POA: Diagnosis not present

## 2015-06-21 DIAGNOSIS — M9904 Segmental and somatic dysfunction of sacral region: Secondary | ICD-10-CM | POA: Diagnosis not present

## 2015-06-21 DIAGNOSIS — M9902 Segmental and somatic dysfunction of thoracic region: Secondary | ICD-10-CM | POA: Diagnosis not present

## 2015-06-21 DIAGNOSIS — M9903 Segmental and somatic dysfunction of lumbar region: Secondary | ICD-10-CM | POA: Diagnosis not present

## 2015-06-24 DIAGNOSIS — M9904 Segmental and somatic dysfunction of sacral region: Secondary | ICD-10-CM | POA: Diagnosis not present

## 2015-06-24 DIAGNOSIS — M9902 Segmental and somatic dysfunction of thoracic region: Secondary | ICD-10-CM | POA: Diagnosis not present

## 2015-06-24 DIAGNOSIS — M5136 Other intervertebral disc degeneration, lumbar region: Secondary | ICD-10-CM | POA: Diagnosis not present

## 2015-06-24 DIAGNOSIS — M9903 Segmental and somatic dysfunction of lumbar region: Secondary | ICD-10-CM | POA: Diagnosis not present

## 2015-06-26 ENCOUNTER — Emergency Department (HOSPITAL_COMMUNITY)
Admission: EM | Admit: 2015-06-26 | Discharge: 2015-06-27 | Disposition: A | Discharge status: Home / Self Care | Payer: PPO | Attending: Emergency Medicine | Admitting: Emergency Medicine

## 2015-06-26 ENCOUNTER — Encounter (HOSPITAL_COMMUNITY): Payer: Self-pay

## 2015-06-26 DIAGNOSIS — S80212A Abrasion, left knee, initial encounter: Secondary | ICD-10-CM | POA: Insufficient documentation

## 2015-06-26 DIAGNOSIS — E785 Hyperlipidemia, unspecified: Secondary | ICD-10-CM | POA: Insufficient documentation

## 2015-06-26 DIAGNOSIS — E119 Type 2 diabetes mellitus without complications: Secondary | ICD-10-CM | POA: Diagnosis not present

## 2015-06-26 DIAGNOSIS — Y9301 Activity, walking, marching and hiking: Secondary | ICD-10-CM | POA: Diagnosis not present

## 2015-06-26 DIAGNOSIS — Y92511 Restaurant or cafe as the place of occurrence of the external cause: Secondary | ICD-10-CM | POA: Diagnosis not present

## 2015-06-26 DIAGNOSIS — Y999 Unspecified external cause status: Secondary | ICD-10-CM | POA: Insufficient documentation

## 2015-06-26 DIAGNOSIS — Z87891 Personal history of nicotine dependence: Secondary | ICD-10-CM | POA: Diagnosis not present

## 2015-06-26 DIAGNOSIS — S0990XA Unspecified injury of head, initial encounter: Secondary | ICD-10-CM | POA: Diagnosis not present

## 2015-06-26 DIAGNOSIS — W010XXA Fall on same level from slipping, tripping and stumbling without subsequent striking against object, initial encounter: Secondary | ICD-10-CM | POA: Diagnosis not present

## 2015-06-26 DIAGNOSIS — S0181XA Laceration without foreign body of other part of head, initial encounter: Secondary | ICD-10-CM | POA: Diagnosis not present

## 2015-06-26 DIAGNOSIS — S4992XA Unspecified injury of left shoulder and upper arm, initial encounter: Secondary | ICD-10-CM | POA: Diagnosis not present

## 2015-06-26 DIAGNOSIS — S0001XA Abrasion of scalp, initial encounter: Secondary | ICD-10-CM | POA: Diagnosis not present

## 2015-06-26 DIAGNOSIS — S80211A Abrasion, right knee, initial encounter: Secondary | ICD-10-CM | POA: Diagnosis not present

## 2015-06-26 DIAGNOSIS — S40012A Contusion of left shoulder, initial encounter: Secondary | ICD-10-CM | POA: Insufficient documentation

## 2015-06-26 DIAGNOSIS — S60511A Abrasion of right hand, initial encounter: Secondary | ICD-10-CM | POA: Diagnosis not present

## 2015-06-26 DIAGNOSIS — Z79899 Other long term (current) drug therapy: Secondary | ICD-10-CM | POA: Insufficient documentation

## 2015-06-26 DIAGNOSIS — Z7984 Long term (current) use of oral hypoglycemic drugs: Secondary | ICD-10-CM | POA: Insufficient documentation

## 2015-06-26 DIAGNOSIS — T07XXXA Unspecified multiple injuries, initial encounter: Secondary | ICD-10-CM

## 2015-06-26 DIAGNOSIS — M25512 Pain in left shoulder: Secondary | ICD-10-CM | POA: Diagnosis not present

## 2015-06-26 NOTE — ED Notes (Addendum)
Pt reports a mechanical fall resulting in bilateral knee bruising, abrasions/laceration, left elbow bruising/abrasion and head abrasion. Pt denies blood thinner use. Pt denies loc. Pt denies neck or head pain. Pt A+OX4, speaking in complete sentences in triage.

## 2015-06-27 ENCOUNTER — Emergency Department (HOSPITAL_COMMUNITY): Payer: PPO

## 2015-06-27 DIAGNOSIS — M25512 Pain in left shoulder: Secondary | ICD-10-CM | POA: Diagnosis not present

## 2015-06-27 DIAGNOSIS — S4992XA Unspecified injury of left shoulder and upper arm, initial encounter: Secondary | ICD-10-CM | POA: Diagnosis not present

## 2015-06-27 DIAGNOSIS — S0181XA Laceration without foreign body of other part of head, initial encounter: Secondary | ICD-10-CM | POA: Diagnosis not present

## 2015-06-27 NOTE — Discharge Instructions (Signed)
Please read and follow all provided instructions.  Your diagnoses today include:  1. Head injury, initial encounter   2. Shoulder contusion, left, initial encounter   3. Abrasions of multiple sites     Tests performed today include:  CT scan of your head that did not show any serious injury.  X-ray of your left shoulder - no broken bones  Vital signs. See below for your results today.   Medications prescribed:   None  Take any prescribed medications only as directed.  Home care instructions:  Follow any educational materials contained in this packet.  Follow-up instructions: Please follow-up with your primary care provider in the next 3 days for further evaluation of your symptoms.   Return instructions:  SEEK IMMEDIATE MEDICAL ATTENTION IF:  There is confusion or drowsiness (although children frequently become drowsy after injury).   You cannot awaken the injured person.   You have more than one episode of vomiting.   You notice dizziness or unsteadiness which is getting worse, or inability to walk.   You have convulsions or unconsciousness.   You experience severe, persistent headaches not relieved by Tylenol.  You cannot use arms or legs normally.   There are changes in pupil sizes. (This is the black center in the colored part of the eye)   There is clear or bloody discharge from the nose or ears.   You have change in speech, vision, swallowing, or understanding.   Localized weakness, numbness, tingling, or change in bowel or bladder control.  You have any other emergent concerns.  Additional Information: You have had a head injury which does not appear to require admission at this time.  Your vital signs today were: BP 135/75 mmHg   Pulse 65   Temp(Src) 97.9 F (36.6 C) (Oral)   Resp 16   SpO2 97% If your blood pressure (BP) was elevated above 135/85 this visit, please have this repeated by your doctor within one month. --------------

## 2015-06-27 NOTE — ED Provider Notes (Signed)
CSN: [id number]     Arrival date & time 06/26/15  2135 History   First MD Initiated Contact with Patient 06/27/15 0031     Chief Complaint  Patient presents with  . Fall  . Head Injury     (Consider location/radiation/quality/duration/timing/severity/associated sxs/prior Treatment) HPI Comments: Patient presents with complaint of minor head injury and left shoulder pain, multiple abrasions after fall. Patient states that he had 2 alcoholic beverages at dinner. Upon walking out of the restaurant, patient stumbled and fell onto his knees. Patient sustained abrasions to his right palm and has pain in left shoulder and abrasion to his left parietal scalp. Patient denies any blood thinner use. He denies loss of consciousness. No headache, vomiting, difficulty with vision. No treatments prior to arrival. Patient denies chest pain or abdominal pain. No hip pain. No neck or lower back pain. Onset of symptoms acute. Course is constant. Nothing makes symptoms better or worse.   Patient is a 80 y.o. male presenting with fall and head injury. The history is provided by the patient.  Fall Associated symptoms include arthralgias. Pertinent negatives include no chest pain, fatigue, headaches, nausea, neck pain, numbness, vomiting or weakness.  Head Injury Associated symptoms: no headaches, no nausea, no neck pain, no numbness, no tinnitus and no vomiting     Past Medical History  Diagnosis Date  . Cancer (HCC)     Skin  . Diabetes mellitus without complication ([place])   . Hyperlipidemia    Past Surgical History  Procedure Laterality Date  . Rotator cuff repair  2003  . Cataract extraction    . Back surgery     History reviewed. No pertinent family history. Social History  Substance Use Topics  . Smoking status: Former Smoker    Quit date: 01/16/1978  . Smokeless tobacco: None  . Alcohol Use: Yes     Comment: occasionally    Review of Systems  Constitutional: Negative for fatigue.  HENT:  Negative for tinnitus.   Eyes: Negative for photophobia, pain and visual disturbance.  Respiratory: Negative for shortness of breath.   Cardiovascular: Negative for chest pain.  Gastrointestinal: Negative for nausea and vomiting.  Musculoskeletal: Positive for arthralgias. Negative for back pain, gait problem and neck pain.  Skin: Positive for wound.  Neurological: Negative for dizziness, weakness, light-headedness, numbness and headaches.  Psychiatric/Behavioral: Negative for confusion and decreased concentration.      Allergies  Review of patient's allergies indicates no known allergies.  Home Medications   Prior to Admission medications   Medication Sig Start Date End Date Taking? Authorizing Provider  atorvastatin (LIPITOR) 40 MG tablet Take 40 mg by mouth daily.    Historical Provider, MD  celecoxib (CELEBREX) 200 MG capsule Take 200 mg by mouth daily.    Historical Provider, MD  imiquimod (ALDARA) 5 % cream Apply 1 application topically 3 (three) times a week. This was a medication used for skin cancer. Pt was taking three times a week for 8 weeks. Last dose was 12/29/11    Historical Provider, MD  MAGNESIUM-ZINC PO Take 1 tablet by mouth daily.    Historical Provider, MD  Multiple Vitamin (MULTIVITAMIN WITH MINERALS) TABS Take 1 tablet by mouth daily.    Historical Provider, MD  pantoprazole (PROTONIX) 40 MG tablet Take 40 mg by mouth daily.    Historical Provider, MD  ramipril (ALTACE) 2.5 MG capsule Take 2.5 mg by mouth every 3 (three) days.    Historical Provider, MD   BP 135/75 mmHg  Pulse 65  Temp(Src) 97.9 F (36.6 C) (Oral)  Resp 16  SpO2 97%   Physical Exam  Constitutional: He is oriented to person, place, and time. He appears well-developed and well-nourished.  HENT:  Head: Normocephalic. Head is without raccoon's eyes and without Battle's sign.  Right Ear: Tympanic membrane, external ear and ear canal normal. No hemotympanum.  Left Ear: Tympanic membrane,  external ear and ear canal normal. No hemotympanum.  Nose: Nose normal. No nasal septal hematoma.  Mouth/Throat: Oropharynx is clear and moist.  Minor left parietal scalp abrasion.  Eyes: Conjunctivae, EOM and lids are normal. Pupils are equal, round, and reactive to light.  No visible hyphema  Neck: Normal range of motion. Neck supple.  Cardiovascular: Normal rate and regular rhythm.   Pulmonary/Chest: Effort normal and breath sounds normal.  Abdominal: Soft. There is no tenderness.  Musculoskeletal:       Right shoulder: Normal.       Left shoulder: He exhibits decreased range of motion, tenderness and bony tenderness.       Right elbow: Normal.      Left elbow: Normal.       Right wrist: Normal.       Left wrist: Normal.       Right hip: Normal.       Left hip: Normal.       Right knee: He exhibits ecchymosis. He exhibits no laceration.       Left knee: He exhibits ecchymosis. He exhibits no laceration.       Cervical back: He exhibits normal range of motion, no tenderness and no bony tenderness.       Thoracic back: He exhibits no tenderness and no bony tenderness.       Lumbar back: He exhibits no tenderness and no bony tenderness.       Right upper arm: Normal. He exhibits no deformity.       Left upper arm: Normal. He exhibits no deformity.       Arms:      Right hand: He exhibits normal range of motion and no tenderness.       Left hand: Normal.  Neurological: He is alert and oriented to person, place, and time. He has normal strength and normal reflexes. No cranial nerve deficit or sensory deficit. Coordination normal. GCS eye subscore is 4. GCS verbal subscore is 5. GCS motor subscore is 6.  Skin: Skin is warm and dry.  Patient with abrasions to his bilateral knees, and right palm. No lacerations. Wounds appear clean and hemostatic.  Psychiatric: He has a normal mood and affect.  Nursing note and vitals reviewed.   ED Course  Procedures (including critical care  time) Imaging Review Ct Head Wo Contrast  06/27/2015  CLINICAL DATA:  Mechanical fall with laceration. No loss of consciousness. History of cancer, diabetes and hyperlipidemia. EXAM: CT HEAD WITHOUT CONTRAST TECHNIQUE: Contiguous axial images were obtained from the base of the skull through the vertex without intravenous contrast. COMPARISON:  MRI of the brain September 05, 2004 FINDINGS: INTRACRANIAL CONTENTS: Moderate to severe ventriculomegaly on the basis of global parenchymal brain volume loss as there is overall commensurate enlargement of cerebral sulci and cerebellar folia. No intraparenchymal hemorrhage, mass effect nor midline shift. Patchy supratentorial white matter hypodensities are less than expected for patient's age and though non-specific likely represent chronic small vessel ischemic disease. No acute large vascular territory infarcts. No abnormal extra-axial fluid collections. Basal cisterns are patent. Mild calcific atherosclerosis of  the carotid siphons. ORBITS: The included ocular globes and orbital contents are non-suspicious. Status post bilateral ocular lens implants. SINUSES: The mastoid aircells and included paranasal sinuses are well-aerated. SKULL/SOFT TISSUES: No skull fracture. No significant soft tissue swelling. IMPRESSION: No acute intracranial process. Moderate to severe global parenchymal brain volume loss. Mild chronic small vessel ischemic disease. Electronically Signed   By: Elon Alas M.D.   On: 06/27/2015 02:01   Dg Shoulder Left  06/27/2015  CLINICAL DATA:  Status post fall, with left shoulder pain. Initial encounter. EXAM: LEFT SHOULDER - 2+ VIEW COMPARISON:  None. FINDINGS: There is no evidence of fracture or dislocation. The left humeral head is seated within the glenoid fossa. The acromioclavicular joint is unremarkable in appearance. No significant soft tissue abnormalities are seen. The visualized portions of the left lung are clear. IMPRESSION: No evidence  of fracture or dislocation. Electronically Signed   By: Garald Balding M.D.   On: 06/27/2015 01:48   I have personally reviewed and evaluated these images and lab results as part of my medical decision-making.   1:26 AM Patient seen and examined. Mechanical fall. Work-up initiated. No wounds requiring repair.    Vital signs reviewed and are as follows: BP 135/75 mmHg  Pulse 65  Temp(Src) 97.9 F (36.6 C) (Oral)  Resp 16  SpO2 97%  2:29 AM Imaging reviewed and is negative. Patient provided with sling. Discussed with Dr. Claudine Mouton.   Patient was counseled on head injury precautions and symptoms that should indicate their return to the ED. These include severe worsening headache, vision changes, confusion, loss of consciousness, trouble walking, nausea & vomiting, or weakness/tingling in extremities.     MDM   Final diagnoses:  Head injury, initial encounter  Shoulder contusion, left, initial encounter  Abrasions of multiple sites   Patient with mechanical fall, no loss of consciousness. Head imaging is negative. Normal neurological exam. No neck pain. Imaging of shoulder is negative for fracture. Conservative measures indicated. Counseled on wound care for abrasions.    Carlisle Cater, PA-C 06/27/15 0230  Everlene Balls, MD 06/27/15 [id number]

## 2015-06-30 DIAGNOSIS — M9902 Segmental and somatic dysfunction of thoracic region: Secondary | ICD-10-CM | POA: Diagnosis not present

## 2015-06-30 DIAGNOSIS — M9903 Segmental and somatic dysfunction of lumbar region: Secondary | ICD-10-CM | POA: Diagnosis not present

## 2015-06-30 DIAGNOSIS — M5136 Other intervertebral disc degeneration, lumbar region: Secondary | ICD-10-CM | POA: Diagnosis not present

## 2015-06-30 DIAGNOSIS — M9904 Segmental and somatic dysfunction of sacral region: Secondary | ICD-10-CM | POA: Diagnosis not present

## 2015-07-05 DIAGNOSIS — M9902 Segmental and somatic dysfunction of thoracic region: Secondary | ICD-10-CM | POA: Diagnosis not present

## 2015-07-05 DIAGNOSIS — M5136 Other intervertebral disc degeneration, lumbar region: Secondary | ICD-10-CM | POA: Diagnosis not present

## 2015-07-05 DIAGNOSIS — M9903 Segmental and somatic dysfunction of lumbar region: Secondary | ICD-10-CM | POA: Diagnosis not present

## 2015-07-05 DIAGNOSIS — M9904 Segmental and somatic dysfunction of sacral region: Secondary | ICD-10-CM | POA: Diagnosis not present

## 2015-07-06 DIAGNOSIS — E785 Hyperlipidemia, unspecified: Secondary | ICD-10-CM | POA: Diagnosis not present

## 2015-07-06 DIAGNOSIS — I10 Essential (primary) hypertension: Secondary | ICD-10-CM | POA: Diagnosis not present

## 2015-07-06 DIAGNOSIS — E119 Type 2 diabetes mellitus without complications: Secondary | ICD-10-CM | POA: Diagnosis not present

## 2015-07-08 DIAGNOSIS — E785 Hyperlipidemia, unspecified: Secondary | ICD-10-CM | POA: Diagnosis not present

## 2015-07-08 DIAGNOSIS — E119 Type 2 diabetes mellitus without complications: Secondary | ICD-10-CM | POA: Diagnosis not present

## 2015-07-08 DIAGNOSIS — I10 Essential (primary) hypertension: Secondary | ICD-10-CM | POA: Diagnosis not present

## 2015-07-12 DIAGNOSIS — M9902 Segmental and somatic dysfunction of thoracic region: Secondary | ICD-10-CM | POA: Diagnosis not present

## 2015-07-12 DIAGNOSIS — M9904 Segmental and somatic dysfunction of sacral region: Secondary | ICD-10-CM | POA: Diagnosis not present

## 2015-07-12 DIAGNOSIS — M5136 Other intervertebral disc degeneration, lumbar region: Secondary | ICD-10-CM | POA: Diagnosis not present

## 2015-07-12 DIAGNOSIS — M9903 Segmental and somatic dysfunction of lumbar region: Secondary | ICD-10-CM | POA: Diagnosis not present

## 2015-07-15 DIAGNOSIS — M19012 Primary osteoarthritis, left shoulder: Secondary | ICD-10-CM | POA: Diagnosis not present

## 2015-07-15 DIAGNOSIS — M75102 Unspecified rotator cuff tear or rupture of left shoulder, not specified as traumatic: Secondary | ICD-10-CM | POA: Diagnosis not present

## 2015-07-15 DIAGNOSIS — M7502 Adhesive capsulitis of left shoulder: Secondary | ICD-10-CM | POA: Diagnosis not present

## 2015-07-26 DIAGNOSIS — M9901 Segmental and somatic dysfunction of cervical region: Secondary | ICD-10-CM | POA: Diagnosis not present

## 2015-07-26 DIAGNOSIS — M9902 Segmental and somatic dysfunction of thoracic region: Secondary | ICD-10-CM | POA: Diagnosis not present

## 2015-07-26 DIAGNOSIS — M5136 Other intervertebral disc degeneration, lumbar region: Secondary | ICD-10-CM | POA: Diagnosis not present

## 2015-07-26 DIAGNOSIS — M9903 Segmental and somatic dysfunction of lumbar region: Secondary | ICD-10-CM | POA: Diagnosis not present

## 2015-07-29 DIAGNOSIS — M19012 Primary osteoarthritis, left shoulder: Secondary | ICD-10-CM | POA: Diagnosis not present

## 2015-08-02 DIAGNOSIS — M9901 Segmental and somatic dysfunction of cervical region: Secondary | ICD-10-CM | POA: Diagnosis not present

## 2015-08-02 DIAGNOSIS — M9903 Segmental and somatic dysfunction of lumbar region: Secondary | ICD-10-CM | POA: Diagnosis not present

## 2015-08-02 DIAGNOSIS — M5136 Other intervertebral disc degeneration, lumbar region: Secondary | ICD-10-CM | POA: Diagnosis not present

## 2015-08-02 DIAGNOSIS — M9902 Segmental and somatic dysfunction of thoracic region: Secondary | ICD-10-CM | POA: Diagnosis not present

## 2015-08-16 DIAGNOSIS — M9901 Segmental and somatic dysfunction of cervical region: Secondary | ICD-10-CM | POA: Diagnosis not present

## 2015-08-16 DIAGNOSIS — M5136 Other intervertebral disc degeneration, lumbar region: Secondary | ICD-10-CM | POA: Diagnosis not present

## 2015-08-16 DIAGNOSIS — M9903 Segmental and somatic dysfunction of lumbar region: Secondary | ICD-10-CM | POA: Diagnosis not present

## 2015-08-16 DIAGNOSIS — M9902 Segmental and somatic dysfunction of thoracic region: Secondary | ICD-10-CM | POA: Diagnosis not present

## 2015-08-23 DIAGNOSIS — M9901 Segmental and somatic dysfunction of cervical region: Secondary | ICD-10-CM | POA: Diagnosis not present

## 2015-08-23 DIAGNOSIS — M9902 Segmental and somatic dysfunction of thoracic region: Secondary | ICD-10-CM | POA: Diagnosis not present

## 2015-08-23 DIAGNOSIS — M5136 Other intervertebral disc degeneration, lumbar region: Secondary | ICD-10-CM | POA: Diagnosis not present

## 2015-08-23 DIAGNOSIS — M9903 Segmental and somatic dysfunction of lumbar region: Secondary | ICD-10-CM | POA: Diagnosis not present

## 2015-08-25 DIAGNOSIS — M25512 Pain in left shoulder: Secondary | ICD-10-CM | POA: Diagnosis not present

## 2015-08-25 DIAGNOSIS — M7502 Adhesive capsulitis of left shoulder: Secondary | ICD-10-CM | POA: Diagnosis not present

## 2015-08-25 DIAGNOSIS — M19012 Primary osteoarthritis, left shoulder: Secondary | ICD-10-CM | POA: Diagnosis not present

## 2015-08-26 DIAGNOSIS — M4316 Spondylolisthesis, lumbar region: Secondary | ICD-10-CM | POA: Diagnosis not present

## 2015-08-30 DIAGNOSIS — M9902 Segmental and somatic dysfunction of thoracic region: Secondary | ICD-10-CM | POA: Diagnosis not present

## 2015-08-30 DIAGNOSIS — M9901 Segmental and somatic dysfunction of cervical region: Secondary | ICD-10-CM | POA: Diagnosis not present

## 2015-08-30 DIAGNOSIS — M9903 Segmental and somatic dysfunction of lumbar region: Secondary | ICD-10-CM | POA: Diagnosis not present

## 2015-08-30 DIAGNOSIS — M5136 Other intervertebral disc degeneration, lumbar region: Secondary | ICD-10-CM | POA: Diagnosis not present

## 2015-09-07 DIAGNOSIS — M5136 Other intervertebral disc degeneration, lumbar region: Secondary | ICD-10-CM | POA: Diagnosis not present

## 2015-09-07 DIAGNOSIS — M9901 Segmental and somatic dysfunction of cervical region: Secondary | ICD-10-CM | POA: Diagnosis not present

## 2015-09-07 DIAGNOSIS — M9903 Segmental and somatic dysfunction of lumbar region: Secondary | ICD-10-CM | POA: Diagnosis not present

## 2015-09-07 DIAGNOSIS — M9902 Segmental and somatic dysfunction of thoracic region: Secondary | ICD-10-CM | POA: Diagnosis not present

## 2015-09-08 DIAGNOSIS — M5126 Other intervertebral disc displacement, lumbar region: Secondary | ICD-10-CM | POA: Diagnosis not present

## 2015-09-08 DIAGNOSIS — M4316 Spondylolisthesis, lumbar region: Secondary | ICD-10-CM | POA: Diagnosis not present

## 2015-09-13 DIAGNOSIS — M9901 Segmental and somatic dysfunction of cervical region: Secondary | ICD-10-CM | POA: Diagnosis not present

## 2015-09-13 DIAGNOSIS — M9902 Segmental and somatic dysfunction of thoracic region: Secondary | ICD-10-CM | POA: Diagnosis not present

## 2015-09-13 DIAGNOSIS — M4316 Spondylolisthesis, lumbar region: Secondary | ICD-10-CM | POA: Diagnosis not present

## 2015-09-13 DIAGNOSIS — M5136 Other intervertebral disc degeneration, lumbar region: Secondary | ICD-10-CM | POA: Diagnosis not present

## 2015-09-13 DIAGNOSIS — M9903 Segmental and somatic dysfunction of lumbar region: Secondary | ICD-10-CM | POA: Diagnosis not present

## 2015-09-21 DIAGNOSIS — M9901 Segmental and somatic dysfunction of cervical region: Secondary | ICD-10-CM | POA: Diagnosis not present

## 2015-09-21 DIAGNOSIS — M9902 Segmental and somatic dysfunction of thoracic region: Secondary | ICD-10-CM | POA: Diagnosis not present

## 2015-09-21 DIAGNOSIS — M5136 Other intervertebral disc degeneration, lumbar region: Secondary | ICD-10-CM | POA: Diagnosis not present

## 2015-09-21 DIAGNOSIS — M9903 Segmental and somatic dysfunction of lumbar region: Secondary | ICD-10-CM | POA: Diagnosis not present

## 2015-09-27 DIAGNOSIS — M5136 Other intervertebral disc degeneration, lumbar region: Secondary | ICD-10-CM | POA: Diagnosis not present

## 2015-09-27 DIAGNOSIS — M9901 Segmental and somatic dysfunction of cervical region: Secondary | ICD-10-CM | POA: Diagnosis not present

## 2015-09-27 DIAGNOSIS — M9902 Segmental and somatic dysfunction of thoracic region: Secondary | ICD-10-CM | POA: Diagnosis not present

## 2015-09-27 DIAGNOSIS — M9903 Segmental and somatic dysfunction of lumbar region: Secondary | ICD-10-CM | POA: Diagnosis not present

## 2015-09-27 DIAGNOSIS — M25512 Pain in left shoulder: Secondary | ICD-10-CM | POA: Diagnosis not present

## 2015-09-27 DIAGNOSIS — M7502 Adhesive capsulitis of left shoulder: Secondary | ICD-10-CM | POA: Diagnosis not present

## 2015-09-27 DIAGNOSIS — M19012 Primary osteoarthritis, left shoulder: Secondary | ICD-10-CM | POA: Diagnosis not present

## 2015-10-04 DIAGNOSIS — M9902 Segmental and somatic dysfunction of thoracic region: Secondary | ICD-10-CM | POA: Diagnosis not present

## 2015-10-04 DIAGNOSIS — M5136 Other intervertebral disc degeneration, lumbar region: Secondary | ICD-10-CM | POA: Diagnosis not present

## 2015-10-04 DIAGNOSIS — M9903 Segmental and somatic dysfunction of lumbar region: Secondary | ICD-10-CM | POA: Diagnosis not present

## 2015-10-04 DIAGNOSIS — M9901 Segmental and somatic dysfunction of cervical region: Secondary | ICD-10-CM | POA: Diagnosis not present

## 2015-10-05 DIAGNOSIS — E785 Hyperlipidemia, unspecified: Secondary | ICD-10-CM | POA: Diagnosis not present

## 2015-10-05 DIAGNOSIS — E119 Type 2 diabetes mellitus without complications: Secondary | ICD-10-CM | POA: Diagnosis not present

## 2015-10-07 DIAGNOSIS — E119 Type 2 diabetes mellitus without complications: Secondary | ICD-10-CM | POA: Diagnosis not present

## 2015-10-07 DIAGNOSIS — I10 Essential (primary) hypertension: Secondary | ICD-10-CM | POA: Diagnosis not present

## 2015-10-11 DIAGNOSIS — M9902 Segmental and somatic dysfunction of thoracic region: Secondary | ICD-10-CM | POA: Diagnosis not present

## 2015-10-11 DIAGNOSIS — M5136 Other intervertebral disc degeneration, lumbar region: Secondary | ICD-10-CM | POA: Diagnosis not present

## 2015-10-11 DIAGNOSIS — M9901 Segmental and somatic dysfunction of cervical region: Secondary | ICD-10-CM | POA: Diagnosis not present

## 2015-10-11 DIAGNOSIS — M9903 Segmental and somatic dysfunction of lumbar region: Secondary | ICD-10-CM | POA: Diagnosis not present

## 2015-10-18 DIAGNOSIS — M9903 Segmental and somatic dysfunction of lumbar region: Secondary | ICD-10-CM | POA: Diagnosis not present

## 2015-10-18 DIAGNOSIS — M9902 Segmental and somatic dysfunction of thoracic region: Secondary | ICD-10-CM | POA: Diagnosis not present

## 2015-10-18 DIAGNOSIS — M5136 Other intervertebral disc degeneration, lumbar region: Secondary | ICD-10-CM | POA: Diagnosis not present

## 2015-10-18 DIAGNOSIS — M9901 Segmental and somatic dysfunction of cervical region: Secondary | ICD-10-CM | POA: Diagnosis not present

## 2015-10-25 DIAGNOSIS — M9901 Segmental and somatic dysfunction of cervical region: Secondary | ICD-10-CM | POA: Diagnosis not present

## 2015-10-25 DIAGNOSIS — M5136 Other intervertebral disc degeneration, lumbar region: Secondary | ICD-10-CM | POA: Diagnosis not present

## 2015-10-25 DIAGNOSIS — M9903 Segmental and somatic dysfunction of lumbar region: Secondary | ICD-10-CM | POA: Diagnosis not present

## 2015-10-25 DIAGNOSIS — M9902 Segmental and somatic dysfunction of thoracic region: Secondary | ICD-10-CM | POA: Diagnosis not present

## 2015-10-28 DIAGNOSIS — R030 Elevated blood-pressure reading, without diagnosis of hypertension: Secondary | ICD-10-CM | POA: Diagnosis not present

## 2015-10-28 DIAGNOSIS — M4316 Spondylolisthesis, lumbar region: Secondary | ICD-10-CM | POA: Diagnosis not present

## 2015-11-01 DIAGNOSIS — M9903 Segmental and somatic dysfunction of lumbar region: Secondary | ICD-10-CM | POA: Diagnosis not present

## 2015-11-01 DIAGNOSIS — M9902 Segmental and somatic dysfunction of thoracic region: Secondary | ICD-10-CM | POA: Diagnosis not present

## 2015-11-01 DIAGNOSIS — M9901 Segmental and somatic dysfunction of cervical region: Secondary | ICD-10-CM | POA: Diagnosis not present

## 2015-11-01 DIAGNOSIS — M5136 Other intervertebral disc degeneration, lumbar region: Secondary | ICD-10-CM | POA: Diagnosis not present

## 2015-11-08 DIAGNOSIS — M5136 Other intervertebral disc degeneration, lumbar region: Secondary | ICD-10-CM | POA: Diagnosis not present

## 2015-11-08 DIAGNOSIS — M9902 Segmental and somatic dysfunction of thoracic region: Secondary | ICD-10-CM | POA: Diagnosis not present

## 2015-11-08 DIAGNOSIS — M9901 Segmental and somatic dysfunction of cervical region: Secondary | ICD-10-CM | POA: Diagnosis not present

## 2015-11-08 DIAGNOSIS — M9903 Segmental and somatic dysfunction of lumbar region: Secondary | ICD-10-CM | POA: Diagnosis not present

## 2015-11-15 DIAGNOSIS — M9903 Segmental and somatic dysfunction of lumbar region: Secondary | ICD-10-CM | POA: Diagnosis not present

## 2015-11-15 DIAGNOSIS — M9902 Segmental and somatic dysfunction of thoracic region: Secondary | ICD-10-CM | POA: Diagnosis not present

## 2015-11-15 DIAGNOSIS — M5136 Other intervertebral disc degeneration, lumbar region: Secondary | ICD-10-CM | POA: Diagnosis not present

## 2015-11-15 DIAGNOSIS — M9901 Segmental and somatic dysfunction of cervical region: Secondary | ICD-10-CM | POA: Diagnosis not present

## 2015-12-06 DIAGNOSIS — M9901 Segmental and somatic dysfunction of cervical region: Secondary | ICD-10-CM | POA: Diagnosis not present

## 2015-12-06 DIAGNOSIS — M5136 Other intervertebral disc degeneration, lumbar region: Secondary | ICD-10-CM | POA: Diagnosis not present

## 2015-12-06 DIAGNOSIS — M9903 Segmental and somatic dysfunction of lumbar region: Secondary | ICD-10-CM | POA: Diagnosis not present

## 2015-12-06 DIAGNOSIS — M9902 Segmental and somatic dysfunction of thoracic region: Secondary | ICD-10-CM | POA: Diagnosis not present

## 2015-12-08 DIAGNOSIS — L821 Other seborrheic keratosis: Secondary | ICD-10-CM | POA: Diagnosis not present

## 2015-12-08 DIAGNOSIS — L814 Other melanin hyperpigmentation: Secondary | ICD-10-CM | POA: Diagnosis not present

## 2015-12-08 DIAGNOSIS — L570 Actinic keratosis: Secondary | ICD-10-CM | POA: Diagnosis not present

## 2015-12-08 DIAGNOSIS — D1801 Hemangioma of skin and subcutaneous tissue: Secondary | ICD-10-CM | POA: Diagnosis not present

## 2015-12-08 DIAGNOSIS — Z85828 Personal history of other malignant neoplasm of skin: Secondary | ICD-10-CM | POA: Diagnosis not present

## 2015-12-08 DIAGNOSIS — D225 Melanocytic nevi of trunk: Secondary | ICD-10-CM | POA: Diagnosis not present

## 2015-12-13 DIAGNOSIS — M5136 Other intervertebral disc degeneration, lumbar region: Secondary | ICD-10-CM | POA: Diagnosis not present

## 2015-12-13 DIAGNOSIS — M9901 Segmental and somatic dysfunction of cervical region: Secondary | ICD-10-CM | POA: Diagnosis not present

## 2015-12-13 DIAGNOSIS — M9902 Segmental and somatic dysfunction of thoracic region: Secondary | ICD-10-CM | POA: Diagnosis not present

## 2015-12-13 DIAGNOSIS — M9903 Segmental and somatic dysfunction of lumbar region: Secondary | ICD-10-CM | POA: Diagnosis not present

## 2015-12-20 DIAGNOSIS — M9903 Segmental and somatic dysfunction of lumbar region: Secondary | ICD-10-CM | POA: Diagnosis not present

## 2015-12-20 DIAGNOSIS — M5136 Other intervertebral disc degeneration, lumbar region: Secondary | ICD-10-CM | POA: Diagnosis not present

## 2015-12-20 DIAGNOSIS — M9901 Segmental and somatic dysfunction of cervical region: Secondary | ICD-10-CM | POA: Diagnosis not present

## 2015-12-20 DIAGNOSIS — M9902 Segmental and somatic dysfunction of thoracic region: Secondary | ICD-10-CM | POA: Diagnosis not present

## 2015-12-27 DIAGNOSIS — M9902 Segmental and somatic dysfunction of thoracic region: Secondary | ICD-10-CM | POA: Diagnosis not present

## 2015-12-27 DIAGNOSIS — M9901 Segmental and somatic dysfunction of cervical region: Secondary | ICD-10-CM | POA: Diagnosis not present

## 2015-12-27 DIAGNOSIS — M5136 Other intervertebral disc degeneration, lumbar region: Secondary | ICD-10-CM | POA: Diagnosis not present

## 2015-12-27 DIAGNOSIS — M9903 Segmental and somatic dysfunction of lumbar region: Secondary | ICD-10-CM | POA: Diagnosis not present

## 2015-12-31 DIAGNOSIS — E119 Type 2 diabetes mellitus without complications: Secondary | ICD-10-CM | POA: Diagnosis not present

## 2015-12-31 DIAGNOSIS — I10 Essential (primary) hypertension: Secondary | ICD-10-CM | POA: Diagnosis not present

## 2016-01-04 DIAGNOSIS — M9901 Segmental and somatic dysfunction of cervical region: Secondary | ICD-10-CM | POA: Diagnosis not present

## 2016-01-04 DIAGNOSIS — M5136 Other intervertebral disc degeneration, lumbar region: Secondary | ICD-10-CM | POA: Diagnosis not present

## 2016-01-04 DIAGNOSIS — M9902 Segmental and somatic dysfunction of thoracic region: Secondary | ICD-10-CM | POA: Diagnosis not present

## 2016-01-04 DIAGNOSIS — M9903 Segmental and somatic dysfunction of lumbar region: Secondary | ICD-10-CM | POA: Diagnosis not present

## 2016-01-05 DIAGNOSIS — R413 Other amnesia: Secondary | ICD-10-CM | POA: Diagnosis not present

## 2016-01-05 DIAGNOSIS — I10 Essential (primary) hypertension: Secondary | ICD-10-CM | POA: Diagnosis not present

## 2016-01-05 DIAGNOSIS — E1149 Type 2 diabetes mellitus with other diabetic neurological complication: Secondary | ICD-10-CM | POA: Diagnosis not present

## 2016-01-24 DIAGNOSIS — M9902 Segmental and somatic dysfunction of thoracic region: Secondary | ICD-10-CM | POA: Diagnosis not present

## 2016-01-24 DIAGNOSIS — M5134 Other intervertebral disc degeneration, thoracic region: Secondary | ICD-10-CM | POA: Diagnosis not present

## 2016-01-31 DIAGNOSIS — M5134 Other intervertebral disc degeneration, thoracic region: Secondary | ICD-10-CM | POA: Diagnosis not present

## 2016-01-31 DIAGNOSIS — M9902 Segmental and somatic dysfunction of thoracic region: Secondary | ICD-10-CM | POA: Diagnosis not present

## 2016-02-03 ENCOUNTER — Ambulatory Visit: Payer: PPO | Admitting: Neurology

## 2016-02-09 ENCOUNTER — Ambulatory Visit (INDEPENDENT_AMBULATORY_CARE_PROVIDER_SITE_OTHER): Payer: PPO | Admitting: Podiatry

## 2016-02-09 ENCOUNTER — Encounter: Payer: Self-pay | Admitting: Podiatry

## 2016-02-09 VITALS — BP 129/71 | HR 54 | Resp 18

## 2016-02-09 DIAGNOSIS — M79605 Pain in left leg: Secondary | ICD-10-CM

## 2016-02-09 DIAGNOSIS — E1142 Type 2 diabetes mellitus with diabetic polyneuropathy: Secondary | ICD-10-CM | POA: Diagnosis not present

## 2016-02-09 DIAGNOSIS — M79604 Pain in right leg: Secondary | ICD-10-CM | POA: Diagnosis not present

## 2016-02-09 NOTE — Progress Notes (Signed)
   Subjective:    Patient ID: Daniel [name], male    DOB: 1935-03-30, 81 y.o.   MRN: [id number]  HPI     This patient presents today at the recommendation of the PDA and his internal medicine office. Patient describes a general constant soreness, numbness and swelling type sensation and has persisted on and off weightbearing since approximately 1980. Patient describes the symptoms rather persistent with gradual progression over time. He describes treatment including Lyrica 50 mg twice a day and Celebrex 200 mg. In addition patient is using an over-the-counter salicylic rub on a daily basis also patient wears a Plastizote multilaminated insole inside a supportive shoe. He states in the past that he has had custom shoe inserts, however, noticed no improvement with custom inserts versus stock Plastizote insoles. Patient describes running in the past, however is not running currently. Patient is diabetic and denies any history of claudication, amputation or skin ulceration  Patient is former smoker Recently retired Optometrist   Review of Systems  Constitutional: Positive for activity change.  Endocrine:       Increase urination  Genitourinary: Positive for frequency.  Musculoskeletal: Positive for back pain.  Allergic/Immunologic: Positive for environmental allergies.  Hematological: Bruises/bleeds easily.  All other systems reviewed and are negative.      Objective:   Physical Exam  Orientated 3  Vascular: No peripheral edema bilaterally No calf pain bilaterally DP pulses 2/4 bilaterally PT pulses 2/4 bilaterally Capillary reflex within normal limits bilaterally  Neurological: Sensation to 10 g monofilament wire intact 3/5 right and 2/5 left Vibratory sensation nonreactive bilaterally Ankle reflex reactive bilaterally  Dermatological: No open skin lesions bilaterally Atrophic skin bilaterally  Musculoskeletal: Atrophic fad pad MPJ and heels bilaterally Overlapping  fifth toes bilaterally Manual motor testing dorsi flexion, plantar flexion, inversion, eversion 5/5 bilaterally Weightbearing patient has stable gait There is no pain or crepitus on range of motion of ankle, subtalar, midtarsal, metatarsophalangeal joints bilaterally       Assessment & Plan:   Assessment: Diabetic with peripheral sensory neuropathy Atrophic fad pad MPJ bilaterally  Plan: At this time I reviewed the results with exam with patient today. I informed him that the primary pain source was his ongoing painful peripheral neuropathy associated with diabetes. Currently patient is maintaining Lyrica and Celebrex and topical rub with some reduction the symptoms. Also, symptoms are somewhat exacerbated by atrophic fad pads bilaterally. I recommended 80 continue wearing the existing insoles and shoeing. I recommended he continue all his medications with one exception. I suggested a compounding rub on a trial dose to see if this would reduce some of the symptoms in lieu of the salicylate rub  Rx compounding formula: [place] Bupivacaine1%/doxepin 3%/gabapentin 6%/pentoxifylline 3%//topiramate 1% Dispense 120 g Apply 1-2 g to affected areas 3-4 times daily, 5 refills  This compounded formula will be mailed directly to patient  Reappoint at patient's request

## 2016-02-09 NOTE — Patient Instructions (Signed)
Today your diabetic foot screen demonstrated adequate circulation with evidence of diabetic peripheral neuropathy. I am going to prescribe a topical rub poor peripheral neuropathy follow the instructions with the compound applying the area 3-4 times daily as needed. If you feel this is helpful continue to use and refill Also, the fat pad on the ball and heels are thin and you are wearing accommodative padding which is the correct material Return as needed or yearly  Diabetes and Foot Care Diabetes may cause you to have problems because of poor blood supply (circulation) to your feet and legs. This may cause the skin on your feet to become thinner, break easier, and heal more slowly. Your skin may become dry, and the skin may peel and crack. You may also have nerve damage in your legs and feet causing decreased feeling in them. You may not notice minor injuries to your feet that could lead to infections or more serious problems. Taking care of your feet is one of the most important things you can do for yourself. Follow these instructions at home:  Wear shoes at all times, even in the house. Do not go barefoot. Bare feet are easily injured.  Check your feet daily for blisters, cuts, and redness. If you cannot see the bottom of your feet, use a mirror or ask someone for help.  Wash your feet with warm water (do not use hot water) and mild soap. Then pat your feet and the areas between your toes until they are completely dry. Do not soak your feet as this can dry your skin.  Apply a moisturizing lotion or petroleum jelly (that does not contain alcohol and is unscented) to the skin on your feet and to dry, brittle toenails. Do not apply lotion between your toes.  Trim your toenails straight across. Do not dig under them or around the cuticle. File the edges of your nails with an emery board or nail file.  Do not cut corns or calluses or try to remove them with medicine.  Wear clean socks or stockings  every day. Make sure they are not too tight. Do not wear knee-high stockings since they may decrease blood flow to your legs.  Wear shoes that fit properly and have enough cushioning. To break in new shoes, wear them for just a few hours a day. This prevents you from injuring your feet. Always look in your shoes before you put them on to be sure there are no objects inside.  Do not cross your legs. This may decrease the blood flow to your feet.  If you find a minor scrape, cut, or break in the skin on your feet, keep it and the skin around it clean and dry. These areas may be cleansed with mild soap and water. Do not cleanse the area with peroxide, alcohol, or iodine.  When you remove an adhesive bandage, be sure not to damage the skin around it.  If you have a wound, look at it several times a day to make sure it is healing.  Do not use heating pads or hot water bottles. They may burn your skin. If you have lost feeling in your feet or legs, you may not know it is happening until it is too late.  Make sure your health care provider performs a complete foot exam at least annually or more often if you have foot problems. Report any cuts, sores, or bruises to your health care provider immediately. Contact a health care provider  if:  You have an injury that is not healing.  You have cuts or breaks in the skin.  You have an ingrown nail.  You notice redness on your legs or feet.  You feel burning or tingling in your legs or feet.  You have pain or cramps in your legs and feet.  Your legs or feet are numb.  Your feet always feel cold. Get help right away if:  There is increasing redness, swelling, or pain in or around a wound.  There is a red line that goes up your leg.  Pus is coming from a wound.  You develop a fever or as directed by your health care provider.  You notice a bad smell coming from an ulcer or wound. This information is not intended to replace advice given to  you by your health care provider. Make sure you discuss any questions you have with your health care provider. Document Released: 12/31/1999 Document Revised: 06/10/2015 Document Reviewed: 06/11/2012 Elsevier Interactive Patient Education  2017 [employer].

## 2016-02-28 DIAGNOSIS — M5134 Other intervertebral disc degeneration, thoracic region: Secondary | ICD-10-CM | POA: Diagnosis not present

## 2016-02-28 DIAGNOSIS — M9902 Segmental and somatic dysfunction of thoracic region: Secondary | ICD-10-CM | POA: Diagnosis not present

## 2016-03-07 DIAGNOSIS — M5134 Other intervertebral disc degeneration, thoracic region: Secondary | ICD-10-CM | POA: Diagnosis not present

## 2016-03-07 DIAGNOSIS — M9902 Segmental and somatic dysfunction of thoracic region: Secondary | ICD-10-CM | POA: Diagnosis not present

## 2016-03-09 ENCOUNTER — Ambulatory Visit (INDEPENDENT_AMBULATORY_CARE_PROVIDER_SITE_OTHER): Payer: PPO | Admitting: Neurology

## 2016-03-09 ENCOUNTER — Encounter: Payer: Self-pay | Admitting: Neurology

## 2016-03-09 DIAGNOSIS — E1142 Type 2 diabetes mellitus with diabetic polyneuropathy: Secondary | ICD-10-CM

## 2016-03-09 DIAGNOSIS — G3184 Mild cognitive impairment, so stated: Secondary | ICD-10-CM | POA: Diagnosis not present

## 2016-03-09 DIAGNOSIS — E559 Vitamin D deficiency, unspecified: Secondary | ICD-10-CM | POA: Diagnosis not present

## 2016-03-09 DIAGNOSIS — G250 Essential tremor: Secondary | ICD-10-CM | POA: Insufficient documentation

## 2016-03-09 DIAGNOSIS — R413 Other amnesia: Secondary | ICD-10-CM | POA: Diagnosis not present

## 2016-03-09 DIAGNOSIS — E1140 Type 2 diabetes mellitus with diabetic neuropathy, unspecified: Secondary | ICD-10-CM | POA: Insufficient documentation

## 2016-03-09 NOTE — Progress Notes (Signed)
[facility]  PATIENT: Daniel [name] DOB: 1935/04/10  REFERRING DOCTOR OR PCP:  Deland Pretty SOURCE: patient, notes from Dr. Shelia Media, labs, imaging reports, CT image  _________________________________   HISTORICAL  CHIEF COMPLAINT:  Chief Complaint  Patient presents with  . Memory Loss    [name] is here with his wife [name] for eval of memory loss, first noted 15-20 yrs .ago, and worse since 2014.  Forgets things such as restaruants they have eaten at for years. Scored 26/30 on MOCA today/fim      HISTORY OF PRESENT ILLNESS:  I had the plesure of seeing your patient, Daniel [name]. [name], at [facility] for neurologic consultation regarding his memory loss.  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   He saw Dr. Jannifer Franklin and had an evaluation with MMSE = 29.  The MRI brain was normal.   He did not worsen but still noted some difficulty at times.Marland Kitchen  He continued to work as an Optometrist.  A few years later he started to notice that memory was becoming more of a problem again.  He sold hs practice in 2014 because he felt there was too high of a chance that he would make errors.Marland Kitchen   He feels memory issues have mildly worsened slowly over time since then..  Currently he is forgetful and has been worse with personal hygiene chores such as showering. He needs reminders to do some tasks such as taking a shower after a few days or eating.   Memory issues are his biggest concern. As an example he couldn't recall the name of a restaurant they were going to even though he had been there multiple times.     Today, he scored 26/30 on the [facility] losing most points for reduced recall but getting them with prompts.    He sleeps well most nights.   He snores There are no witnessed gasps/snorts or pauses.   He has leg cramps at night that sometime awaken him.   He has nocturia x 3 most nights.    He does not have REM behavior disorder  symptoms.  He drives and sometimes misses turns but has never gotten lost.  He denies depression but is sometimes irritable.   He is diabetic and has mild diabetic polyneuropathy, helped by Lyrica.  He sees Dr. Ellene Route and may need L3-L5 surgery.      He used to be very active and did a lot of dancing and ran 10K.      His great grandmother had dementia in her late 92's.  His mother had dementia that started in her 61's and died of a stroke, 2 aunts had severe dementia.   An uncle also had dementia (mild).  A second uncle had very mild dementia (late 47's) but died of unrelatred issues a year later.     I personally reviewed the CT scan from 06/27/2015 and compared it with the MRI from 09/05/2004. The CT scan shows moderate cortical atrophy, probably more pronounced in the mesial temporal lobes. There also appears to be some chronic microvascular ischemic change. The atrophy has progressed since the MRI from 2006.  REVIEW OF SYSTEMS: Constitutional: No fevers, chills, sweats, or change in appetite Eyes: No visual changes, double vision, eye pain Ear, nose and throat: No hearing loss, ear pain, nasal congestion, sore throat Cardiovascular: No chest pain, palpitations Respiratory: No shortness of breath at rest or with exertion.  No wheezes GastrointestinaI: No nausea, vomiting, diarrhea, abdominal pain, fecal incontinence Genitourinary: No dysuria, urinary retention or frequency.  No nocturia. Musculoskeletal: reports back and leg pain Integumentary: No rash, pruritus, skin lesions Neurological: as above Psychiatric: No depression at this time.  No anxiety Endocrine: He has DM Type 2.  No palpitations, diaphoresis, change in appetite, change in weigh or increased thirst Hematologic/Lymphatic: No anemia, purpura, petechiae. Allergic/Immunologic: No itchy/runny eyes, nasal congestion, recent allergic reactions, rashes  ALLERGIES: No Known Allergies  HOME MEDICATIONS:  Current  Outpatient Prescriptions:  .  atorvastatin (LIPITOR) 40 MG tablet, Take 40 mg by mouth daily., Disp: , Rfl:  .  celecoxib (CELEBREX) 200 MG capsule, Take 200 mg by mouth daily., Disp: , Rfl:  .  ezetimibe (ZETIA) 10 MG tablet, Take 10 mg by mouth daily., Disp: , Rfl: 3 .  hydroxypropyl methylcellulose / hypromellose (ISOPTO TEARS / GONIOVISC) 2.5 % ophthalmic solution, Place 1 drop into both eyes 3 (three) times daily as needed for dry eyes., Disp: , Rfl:  .  imiquimod (ALDARA) 5 % cream, Apply 1 application topically 3 (three) times a week. This was a medication used for skin cancer. Pt was taking three times a week for 8 weeks. Last dose was 12/29/11, Disp: , Rfl:  .  LYRICA 50 MG capsule, Take 50 mg by mouth 2 (two) times daily. , Disp: , Rfl: 3 .  MAGNESIUM-ZINC PO, Take 1 tablet by mouth daily., Disp: , Rfl:  .  metFORMIN (GLUCOPHAGE) 500 MG tablet, Take 500 mg by mouth 2 (two) times daily., Disp: , Rfl: 2 .  Multiple Vitamin (MULTIVITAMIN WITH MINERALS) TABS, Take 1 tablet by mouth daily., Disp: , Rfl:  .  pantoprazole (PROTONIX) 40 MG tablet, Take 40 mg by mouth daily., Disp: , Rfl:  .  ramipril (ALTACE) 2.5 MG capsule, Take 2.5 mg by mouth every 3 (three) days., Disp: , Rfl:   PAST MEDICAL HISTORY: Past Medical History:  Diagnosis Date  . Cancer (HCC)    Skin  . Diabetes mellitus without complication ([place])   . Hyperlipidemia     PAST SURGICAL HISTORY: Past Surgical History:  Procedure Laterality Date  . BACK SURGERY    . CATARACT EXTRACTION    . ROTATOR CUFF REPAIR  2003    FAMILY HISTORY: No family history on file.  SOCIAL HISTORY:  Social History   Social History  . Marital status: Married    Spouse name: N/A  . Number of children: N/A  . Years of education: N/A   Occupational History  . Not on file.   Social History Main Topics  . Smoking status: Former Smoker    Quit date: 01/16/1978  . Smokeless tobacco: Never Used  . Alcohol use Yes     Comment:  occasionally  . Drug use: Unknown  . Sexual activity: Not on file   Other Topics Concern  . Not on file   Social History Narrative  . No narrative on file     PHYSICAL EXAM  Vitals:   03/09/16 1047  BP: 138/70  Pulse: 72  Resp: 20  Weight: 173 lb 8 oz (78.7 kg)  Height: 6' 0.75" (1.848 m)    Body mass index is 23.05 kg/m.   General: The patient is well-developed and well-nourished and in no acute distress  Eyes:  Funduscopic exam shows normal optic discs and retinal vessels.  Neck: The neck is supple, no carotid bruits are noted.  The neck is nontender.  Cardiovascular: The  heart has a regular rate and rhythm with a normal S1 and S2. There were no murmurs, gallops or rubs. Lungs are clear to auscultation.  Skin: Extremities are without significant edema.  Musculoskeletal:  Back is nontender  Neurologic Exam  Mental status: The patient is alert and oriented x 2 1/2 at the time of the examination (wrong date). The patient has apparent normal remote memory but reduced short-term memory requiring prompts.   He has apparently normal attention span and concentration ability.   Speech is normal.  Cranial nerves: Extraocular movements are full. Pupils are equal, round, and reactive to light and accomodation.  Visual fields are full.  Facial symmetry is present. There is good facial sensation to soft touch bilaterally. Facial strength is normal.  Trapezius and sternocleidomastoid strength is normal. No dysarthria is noted.  The tongue is midline, and the patient has symmetric elevation of the soft palate. No obvious hearing deficits are noted.  Motor:  He has a 6-7 hz tremor in his right hand only.   Muscle bulk is normal.   Tone is normal. Strength is  5 / 5 in all 4 extremities.   Sensory: He had intact sensation to touch temperature and vibration in the hands and arms and proximal legs. He had reduced vibration sensation in the toes to about 25%. He also had reduced touch  sensation in the toes but normal sensation above the ankles.  Coordination: Cerebellar testing reveals good finger-nose-finger and heel-to-shin bilaterally.  Gait and station: Station is normal.   Gait is ok but right arm swing is reduced on the right . Tandem gait is very wide.   No retropulsion. Romberg is negative.   Reflexes: Deep tendon reflexes are symmetric and normal bilaterally.   Plantar responses are flexor.    DIAGNOSTIC DATA (LABS, IMAGING, TESTING) - I reviewed patient records, labs, notes, testing and imaging myself where available.  Lab Results  Component Value Date   WBC 5.4 05/05/2014   HGB 14.2 05/05/2014   HCT 41.1 05/05/2014   MCV 93.0 05/05/2014   PLT 165 05/05/2014      Component Value Date/Time   NA 137 01/01/2012 1405   K 4.0 01/01/2012 1405   CL 98 01/01/2012 1405   CO2 26 01/01/2012 1405   GLUCOSE 169 (H) 01/01/2012 1405   BUN 19 01/01/2012 1405   CREATININE 0.72 01/01/2012 1405   CALCIUM 9.4 01/01/2012 1405   PROT 7.2 01/01/2012 1405   ALBUMIN 4.2 01/01/2012 1405   AST 71 (H) 01/01/2012 1405   ALT 83 (H) 01/01/2012 1405   ALKPHOS 71 01/01/2012 1405   BILITOT 0.6 01/01/2012 1405   GFRNONAA 90 (L) 01/01/2012 1405   GFRAA >90 01/01/2012 1405       ASSESSMENT AND PLAN  Memory loss - Plan: MR BRAIN WO CONTRAST, TSH, Sedimentation rate, Vitamin B12, VITAMIN D 25 Hydroxy (Vit-D Deficiency, Fractures)  Mild cognitive impairment  Essential tremor - Plan: TSH  Diabetic polyneuropathy associated with type 2 diabetes mellitus (HCC)  Vitamin D deficiency - Plan: VITAMIN D 25 Hydroxy (Vit-D Deficiency, Fractures)    In summary, Mr. Kovacich is a 81 year old man with mild cognitive impairment. Most of the points he lost on the [place] cognitive assessment test was due to reduced recall. With prompts, however, he was able to get the words he missed.   Imaging studies do show some atrophy that appears worse than was present in 2006, though some is  present then as well. There also appears  to be some chronic buttock vessel ischemic changes. We will check an MRI of the brain to determine if there are progressive changes which could be contributing to his worsening symptoms and to determine if there is any treatable etiology. Additionally, many people with mild cognitive impairment do not have a primary memory disorder (such as AD) but have cognitive issues due to other medical problems, poor sleep or depression. He does not appear to have a significant mood disturbance and he is thin waking sleep apnea unlikely. However, he does snore. We will check blood work to rule out other medical disorders that may be contributing.  He will return to see me in 3 months for a regular follow-up. We will call him with the results and bring him in sooner if significant problems are noted.   The changes on MRI are most consistent with Alzheimer's disease, he may be a good candidate for an early Alzheimer's drug study.   Thank you for asking me to see Mr. Jory. Please let me know if I can be of further assistance with her or other patients in the future.   Richard A. Felecia Shelling, MD, PhD [id number], [id number] Certified in Neurology, Clinical Neurophysiology, Sleep Medicine, Pain Medicine and Neuroimaging  [facility] [address], [place], [place] [address] [phone]

## 2016-03-10 ENCOUNTER — Telehealth: Payer: Self-pay | Admitting: *Deleted

## 2016-03-10 LAB — TSH: TSH: 1.91 u[IU]/mL (ref 0.450–4.500)

## 2016-03-10 LAB — VITAMIN B12: Vitamin B-12: 528 pg/mL (ref 232–1245)

## 2016-03-10 LAB — SEDIMENTATION RATE: SED RATE: 2 mm/h (ref 0–30)

## 2016-03-10 LAB — VITAMIN D 25 HYDROXY (VIT D DEFICIENCY, FRACTURES): Vit D, 25-Hydroxy: 38.4 ng/mL (ref 30.0–100.0)

## 2016-03-10 NOTE — Telephone Encounter (Signed)
-----   Message from Britt Bottom, MD sent at 03/10/2016  8:35 AM EST ----- Please let them know that the lab work was normal.

## 2016-03-10 NOTE — Telephone Encounter (Signed)
I have spoken with pt's wife and per RAS, advised that labwork done in our office is normal.  She verbalized understanding of same/fim

## 2016-03-13 DIAGNOSIS — M9902 Segmental and somatic dysfunction of thoracic region: Secondary | ICD-10-CM | POA: Diagnosis not present

## 2016-03-13 DIAGNOSIS — M5134 Other intervertebral disc degeneration, thoracic region: Secondary | ICD-10-CM | POA: Diagnosis not present

## 2016-03-21 ENCOUNTER — Ambulatory Visit
Admission: RE | Admit: 2016-03-21 | Discharge: 2016-03-21 | Disposition: A | Discharge status: Home / Self Care | Payer: PPO | Source: Ambulatory Visit | Attending: Neurology | Admitting: Neurology

## 2016-03-21 DIAGNOSIS — R413 Other amnesia: Secondary | ICD-10-CM

## 2016-03-22 ENCOUNTER — Other Ambulatory Visit: Payer: Self-pay | Admitting: Neurology

## 2016-03-22 ENCOUNTER — Telehealth: Payer: Self-pay | Admitting: Neurology

## 2016-03-22 MED ORDER — DONEPEZIL HCL 10 MG PO TABS
ORAL_TABLET | ORAL | 11 refills | Status: DC
Start: 2016-03-22 — End: 2016-09-04

## 2016-03-22 NOTE — Telephone Encounter (Signed)
I spoke to Daniel [name] to let know the results of the MRI which shows that he does have atrophy in a pattern that would be consistent with Alzheimer's disease. Therefore, that is his most likely diagnosis.  I will call in Aricept. Additionally, I am going to forward his name to the research staff as he could be an excellent candidate for one of the AD studies.   Also, everybody in the study will get a PET scans which can help to definitively diagnose him.

## 2016-03-28 DIAGNOSIS — M9902 Segmental and somatic dysfunction of thoracic region: Secondary | ICD-10-CM | POA: Diagnosis not present

## 2016-03-28 DIAGNOSIS — M5134 Other intervertebral disc degeneration, thoracic region: Secondary | ICD-10-CM | POA: Diagnosis not present

## 2016-03-29 NOTE — Telephone Encounter (Signed)
Pt's wife called said pt does not remember enough of the conversation to relay it to his wife. Please call her at [phone]

## 2016-03-29 NOTE — Telephone Encounter (Signed)
I have spoken with Daniel [name] this afternoon, and reviewed MRI results as outlined below by RAS.  She verbalized understanding of same, sts. if pt. is a candidate for a memory study, they would like to participate/fim

## 2016-04-03 DIAGNOSIS — M5134 Other intervertebral disc degeneration, thoracic region: Secondary | ICD-10-CM | POA: Diagnosis not present

## 2016-04-03 DIAGNOSIS — M9902 Segmental and somatic dysfunction of thoracic region: Secondary | ICD-10-CM | POA: Diagnosis not present

## 2016-04-03 DIAGNOSIS — E1149 Type 2 diabetes mellitus with other diabetic neurological complication: Secondary | ICD-10-CM | POA: Diagnosis not present

## 2016-04-04 DIAGNOSIS — E119 Type 2 diabetes mellitus without complications: Secondary | ICD-10-CM | POA: Diagnosis not present

## 2016-04-04 DIAGNOSIS — E785 Hyperlipidemia, unspecified: Secondary | ICD-10-CM | POA: Diagnosis not present

## 2016-04-04 DIAGNOSIS — I10 Essential (primary) hypertension: Secondary | ICD-10-CM | POA: Diagnosis not present

## 2016-04-06 DIAGNOSIS — H52203 Unspecified astigmatism, bilateral: Secondary | ICD-10-CM | POA: Diagnosis not present

## 2016-04-06 DIAGNOSIS — Z961 Presence of intraocular lens: Secondary | ICD-10-CM | POA: Diagnosis not present

## 2016-04-06 DIAGNOSIS — H524 Presbyopia: Secondary | ICD-10-CM | POA: Diagnosis not present

## 2016-04-06 DIAGNOSIS — E119 Type 2 diabetes mellitus without complications: Secondary | ICD-10-CM | POA: Diagnosis not present

## 2016-04-10 DIAGNOSIS — M5134 Other intervertebral disc degeneration, thoracic region: Secondary | ICD-10-CM | POA: Diagnosis not present

## 2016-04-10 DIAGNOSIS — M9902 Segmental and somatic dysfunction of thoracic region: Secondary | ICD-10-CM | POA: Diagnosis not present

## 2016-04-17 DIAGNOSIS — M9902 Segmental and somatic dysfunction of thoracic region: Secondary | ICD-10-CM | POA: Diagnosis not present

## 2016-04-17 DIAGNOSIS — M5134 Other intervertebral disc degeneration, thoracic region: Secondary | ICD-10-CM | POA: Diagnosis not present

## 2016-04-24 DIAGNOSIS — M9902 Segmental and somatic dysfunction of thoracic region: Secondary | ICD-10-CM | POA: Diagnosis not present

## 2016-04-24 DIAGNOSIS — M5134 Other intervertebral disc degeneration, thoracic region: Secondary | ICD-10-CM | POA: Diagnosis not present

## 2016-05-01 DIAGNOSIS — M5134 Other intervertebral disc degeneration, thoracic region: Secondary | ICD-10-CM | POA: Diagnosis not present

## 2016-05-01 DIAGNOSIS — M9902 Segmental and somatic dysfunction of thoracic region: Secondary | ICD-10-CM | POA: Diagnosis not present

## 2016-05-08 DIAGNOSIS — M5134 Other intervertebral disc degeneration, thoracic region: Secondary | ICD-10-CM | POA: Diagnosis not present

## 2016-05-08 DIAGNOSIS — M9902 Segmental and somatic dysfunction of thoracic region: Secondary | ICD-10-CM | POA: Diagnosis not present

## 2016-05-15 DIAGNOSIS — M9903 Segmental and somatic dysfunction of lumbar region: Secondary | ICD-10-CM | POA: Diagnosis not present

## 2016-05-15 DIAGNOSIS — M5136 Other intervertebral disc degeneration, lumbar region: Secondary | ICD-10-CM | POA: Diagnosis not present

## 2016-05-15 DIAGNOSIS — M9905 Segmental and somatic dysfunction of pelvic region: Secondary | ICD-10-CM | POA: Diagnosis not present

## 2016-05-15 DIAGNOSIS — M9904 Segmental and somatic dysfunction of sacral region: Secondary | ICD-10-CM | POA: Diagnosis not present

## 2016-05-22 DIAGNOSIS — M5136 Other intervertebral disc degeneration, lumbar region: Secondary | ICD-10-CM | POA: Diagnosis not present

## 2016-05-22 DIAGNOSIS — M9904 Segmental and somatic dysfunction of sacral region: Secondary | ICD-10-CM | POA: Diagnosis not present

## 2016-05-22 DIAGNOSIS — M9905 Segmental and somatic dysfunction of pelvic region: Secondary | ICD-10-CM | POA: Diagnosis not present

## 2016-05-22 DIAGNOSIS — M9903 Segmental and somatic dysfunction of lumbar region: Secondary | ICD-10-CM | POA: Diagnosis not present

## 2016-05-29 DIAGNOSIS — M5136 Other intervertebral disc degeneration, lumbar region: Secondary | ICD-10-CM | POA: Diagnosis not present

## 2016-05-29 DIAGNOSIS — M9905 Segmental and somatic dysfunction of pelvic region: Secondary | ICD-10-CM | POA: Diagnosis not present

## 2016-05-29 DIAGNOSIS — M9903 Segmental and somatic dysfunction of lumbar region: Secondary | ICD-10-CM | POA: Diagnosis not present

## 2016-05-29 DIAGNOSIS — M9904 Segmental and somatic dysfunction of sacral region: Secondary | ICD-10-CM | POA: Diagnosis not present

## 2016-06-06 DIAGNOSIS — M9903 Segmental and somatic dysfunction of lumbar region: Secondary | ICD-10-CM | POA: Diagnosis not present

## 2016-06-06 DIAGNOSIS — M5136 Other intervertebral disc degeneration, lumbar region: Secondary | ICD-10-CM | POA: Diagnosis not present

## 2016-06-06 DIAGNOSIS — M9904 Segmental and somatic dysfunction of sacral region: Secondary | ICD-10-CM | POA: Diagnosis not present

## 2016-06-06 DIAGNOSIS — M9905 Segmental and somatic dysfunction of pelvic region: Secondary | ICD-10-CM | POA: Diagnosis not present

## 2016-06-07 DIAGNOSIS — E1149 Type 2 diabetes mellitus with other diabetic neurological complication: Secondary | ICD-10-CM | POA: Diagnosis not present

## 2016-06-07 DIAGNOSIS — N401 Enlarged prostate with lower urinary tract symptoms: Secondary | ICD-10-CM | POA: Diagnosis not present

## 2016-06-08 ENCOUNTER — Ambulatory Visit (INDEPENDENT_AMBULATORY_CARE_PROVIDER_SITE_OTHER): Payer: PPO | Admitting: Neurology

## 2016-06-08 ENCOUNTER — Encounter: Payer: Self-pay | Admitting: Neurology

## 2016-06-08 VITALS — BP 113/70 | HR 63 | Resp 20 | Ht 72.75 in | Wt 170.5 lb

## 2016-06-08 DIAGNOSIS — G250 Essential tremor: Secondary | ICD-10-CM | POA: Diagnosis not present

## 2016-06-08 DIAGNOSIS — R197 Diarrhea, unspecified: Secondary | ICD-10-CM | POA: Diagnosis not present

## 2016-06-08 DIAGNOSIS — E1142 Type 2 diabetes mellitus with diabetic polyneuropathy: Secondary | ICD-10-CM | POA: Diagnosis not present

## 2016-06-08 DIAGNOSIS — R413 Other amnesia: Secondary | ICD-10-CM | POA: Diagnosis not present

## 2016-06-08 DIAGNOSIS — G3184 Mild cognitive impairment, so stated: Secondary | ICD-10-CM

## 2016-06-08 MED ORDER — RIVASTIGMINE 9.5 MG/24HR TD PT24: 9.5000 mg | MEDICATED_PATCH | Freq: Every day | TRANSDERMAL | 11 refills | Status: DC

## 2016-06-08 MED ORDER — RIVASTIGMINE 4.6 MG/24HR TD PT24: 4.6000 mg | MEDICATED_PATCH | Freq: Every day | TRANSDERMAL | 0 refills | Status: DC

## 2016-06-08 NOTE — Progress Notes (Signed)
[facility]  PATIENT: Daniel [name] DOB: 1935-03-14  REFERRING DOCTOR OR PCP:  Deland Pretty SOURCE: patient, notes from Dr. Shelia Media, labs, imaging reports, CT image  _________________________________   HISTORICAL  CHIEF COMPLAINT:  Chief Complaint  Patient presents with  . Memory Loss    Wife sts. memory about the same and feels pt. had some improvement once he started Aricept.  MOCA prior to starting Aricept 26/30.  MOCA today 28/30/fim    HISTORY OF PRESENT ILLNESS:  Daniel [name] is a 81 yo man with memory loss.  At the initial visit, donepezil was started and he is doing better going from a MoCA score of 26 to 28.    He has had a lot of diarrhea.  This is inconsistent but occurs at least a few times a week and he has had fecal incontinence.        He often has feelings of Deja vu when they are out of the house or when watching TV.     As an example, they were looking at a map of [place] and he swore he had been there even though he had not.    He will be having lumbar surgery at L3-L5 soon by Dr. Ellene Route.   He sleeps well most nights.   He snores There are no witnessed gasps/snorts or pauses.      He does not have REM behavior disorder symptoms.  He drives and sometimes misses turns but has never gotten lost.  He denies depression but is sometimes irritable.   He is diabetic and has mild diabetic polyneuropathy, helped by Lyrica.    He used to be very active and did a lot of dancing and ran 10K.        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   He saw Dr. Jannifer Franklin and had an evaluation with MMSE = 29.  The MRI brain was normal.   He did not worsen but still noted some difficulty at times.Marland Kitchen  He continued to work as an Optometrist.  A few years later he started to notice that memory was becoming more of a problem again.  He sold hs practice in 2014 because he felt there was too high of a chance that he would make errors.Marland Kitchen   He feels memory issues mildly worsened slowly over time since then..  I have reviewed the CT scan from 06/27/2015 and compared it with the MRI from 09/05/2004. The CT scan shows moderate cortical atrophy, probably more pronounced in the mesial temporal lobes. There also appears to be some chronic microvascular ischemic change. The atrophy has progressed since the MRI from 2006.   At his initial visit in early 2018, the MoCA score was 26/30.    Follow-up 06/04/2016 showed improved scores of 28/30. However, he was experiencing a lot of diarrhea. Donepezil was switched to Exelon.  His great grandmother had dementia in her late 79's.  His mother had dementia that started in her 14's and died of a stroke, 2 aunts had severe dementia.   An uncle also had dementia (mild).  A second uncle had very mild dementia (late 52's) but died of unrelatred issues a year later.       REVIEW OF SYSTEMS: Constitutional: No fevers, chills, sweats, or change in appetite Eyes: No visual changes, double vision, eye pain Ear, nose and throat: No hearing loss, ear pain, nasal congestion, sore throat Cardiovascular: No chest pain, palpitations Respiratory: No shortness of breath at rest or with exertion.   No wheezes GastrointestinaI: No nausea, vomiting, diarrhea, abdominal pain, fecal incontinence Genitourinary: No dysuria, urinary retention or frequency.  No nocturia. Musculoskeletal: reports back and leg pain Integumentary: No rash,  pruritus, skin lesions Neurological: as above Psychiatric: No depression at this time.  No anxiety Endocrine: He has DM Type 2.  No palpitations, diaphoresis, change in appetite, change in weigh or increased thirst Hematologic/Lymphatic: No anemia, purpura, petechiae. Allergic/Immunologic: No itchy/runny eyes, nasal congestion, recent allergic reactions, rashes  ALLERGIES: No Known Allergies  HOME MEDICATIONS:  Current Outpatient Prescriptions:  .  atorvastatin (LIPITOR) 40 MG tablet, Take 40 mg by mouth daily., Disp: , Rfl:  .  celecoxib (CELEBREX) 200 MG capsule, Take 200 mg by mouth daily., Disp: , Rfl:  .  donepezil (ARICEPT) 10 MG tablet, For 2 weeks, take 1/2 pill at bedtime then increase to one pill at bedtime nightly, Disp: 30 tablet, Rfl: 11 .  ezetimibe (ZETIA) 10 MG tablet, Take 10 mg by mouth daily., Disp: , Rfl: 3 .  hydroxypropyl methylcellulose / hypromellose (ISOPTO TEARS / GONIOVISC) 2.5 % ophthalmic solution, Place 1 drop into both eyes 3 (three) times daily as needed for dry eyes., Disp: , Rfl:  .  imiquimod (ALDARA) 5 % cream, Apply 1 application topically 3 (three) times a week. This was a medication used for skin cancer. Pt was taking three times a week for 8 weeks. Last dose was 12/29/11, Disp: , Rfl:  .  LYRICA 50 MG capsule, Take 50 mg by mouth 2 (two) times daily. , Disp: , Rfl: 3 .  MAGNESIUM-ZINC PO, Take 1 tablet by mouth daily., Disp: , Rfl:  .  metFORMIN (GLUCOPHAGE) 500 MG tablet, Take 500 mg by mouth 2 (two) times daily., Disp: , Rfl: 2 .  Multiple Vitamin (MULTIVITAMIN WITH MINERALS) TABS, Take 1 tablet by mouth daily., Disp: , Rfl:  .  pantoprazole (PROTONIX) 40 MG tablet, Take 40 mg by mouth daily., Disp: , Rfl:  .  ramipril (ALTACE) 2.5 MG capsule, Take 2.5 mg by mouth every 3 (three) days., Disp: , Rfl:  .  rivastigmine (EXELON) 4.6 mg/24hr, Place 1 patch (4.6 mg total) onto the skin daily., Disp: 30 patch, Rfl: 0 .  rivastigmine (EXELON) 9.5 mg/24hr,  Place 1 patch (9.5 mg total) onto the skin daily., Disp: 30 patch, Rfl: 11  PAST MEDICAL HISTORY: Past Medical History:  Diagnosis Date  . Cancer (HCC)    Skin  . Diabetes mellitus without complication ([place])   . Hyperlipidemia     PAST SURGICAL HISTORY: Past Surgical History:  Procedure Laterality Date  . BACK SURGERY    . CATARACT EXTRACTION    . ROTATOR CUFF REPAIR  2003    FAMILY HISTORY: No family history on file.  SOCIAL HISTORY:  Social History   Social History  . Marital status: Married  Spouse name: N/A  . Number of children: N/A  . Years of education: N/A   Occupational History  . Not on file.   Social History Main Topics  . Smoking status: Former Smoker    Quit date: 01/16/1978  . Smokeless tobacco: Never Used  . Alcohol use Yes     Comment: occasionally  . Drug use: Unknown  . Sexual activity: Not on file   Other Topics Concern  . Not on file   Social History Narrative  . No narrative on file     PHYSICAL EXAM  Vitals:   06/08/16 1528  BP: 113/70  Pulse: 63  Resp: 20  Weight: 170 lb 8 oz (77.3 kg)  Height: 6' 0.75" (1.848 m)    Body mass index is 22.65 kg/m.   General: The patient is well-developed and well-nourished and in no acute distress   Neurologic Exam  Mental status: The patient is alert and oriented x 2 1/2 at the time of the examination (wrong date). The patient has apparent normal remote memory but reduced short-term memory requiring prompts.   He has apparently normal attention span and concentration ability.   Speech is normal.  Cranial nerves: Extraocular movements are full. Facial strength and sensation is normal. Trapezius and sternocleidomastoid strength is normal. No dysarthria is noted.  The tongue is midline, and the patient has symmetric elevation of the soft palate. No obvious hearing deficits are noted.  Motor:  He has a very mild 6-7 hz tremor in his right hand only.   Muscle bulk is normal.   Tone is  normal. Strength is  5 / 5 in all 4 extremities.   Sensory: He had intact sensation to touch temperature and vibration in the hands and arms and proximal legs. He has reduced vibration sensation at the ankles.  Coordination: Cerebellar testing reveals good finger-nose-finger and heel-to-shin bilaterally.  Gait and station: Station is normal.   Gait is ok but right arm swing is reduced on the right . Tandem gait is wide.    Romberg is negative.   Reflexes: Deep tendon reflexes are symmetric and normal bilaterally.       DIAGNOSTIC DATA (LABS, IMAGING, TESTING) - I reviewed patient records, labs, notes, testing and imaging myself where available.  Lab Results  Component Value Date   WBC 5.4 05/05/2014   HGB 14.2 05/05/2014   HCT 41.1 05/05/2014   MCV 93.0 05/05/2014   PLT 165 05/05/2014      Component Value Date/Time   NA 137 01/01/2012 1405   K 4.0 01/01/2012 1405   CL 98 01/01/2012 1405   CO2 26 01/01/2012 1405   GLUCOSE 169 (H) 01/01/2012 1405   BUN 19 01/01/2012 1405   CREATININE 0.72 01/01/2012 1405   CALCIUM 9.4 01/01/2012 1405   PROT 7.2 01/01/2012 1405   ALBUMIN 4.2 01/01/2012 1405   AST 71 (H) 01/01/2012 1405   ALT 83 (H) 01/01/2012 1405   ALKPHOS 71 01/01/2012 1405   BILITOT 0.6 01/01/2012 1405   GFRNONAA 90 (L) 01/01/2012 1405   GFRAA >90 01/01/2012 1405       ASSESSMENT AND PLAN  Mild cognitive impairment  Memory loss  Essential tremor  Diabetic polyneuropathy associated with type 2 diabetes mellitus (HCC)  Diarrhea, unspecified type   1.   Since he has had diarrhea on donepezil, I will have him try the Exelon patch.   If bowel issues do not improve, consider Namenda instead. 2.    Continue to  stay active and exercises tolerated. 3.    Return in 4-6 months or sooner if there are new or worsening neurologic symptoms.  Caroll Cunnington A. Felecia Shelling, MD, PhD 9/48/5462, 7:03 PM Certified in Neurology, Clinical Neurophysiology, Sleep Medicine, Pain Medicine and  Neuroimaging  [facility] [address], [place] [address], [place] [address] [phone]

## 2016-06-13 DIAGNOSIS — M9904 Segmental and somatic dysfunction of sacral region: Secondary | ICD-10-CM | POA: Diagnosis not present

## 2016-06-13 DIAGNOSIS — M5136 Other intervertebral disc degeneration, lumbar region: Secondary | ICD-10-CM | POA: Diagnosis not present

## 2016-06-13 DIAGNOSIS — M9903 Segmental and somatic dysfunction of lumbar region: Secondary | ICD-10-CM | POA: Diagnosis not present

## 2016-06-13 DIAGNOSIS — M9905 Segmental and somatic dysfunction of pelvic region: Secondary | ICD-10-CM | POA: Diagnosis not present

## 2016-06-19 DIAGNOSIS — M9901 Segmental and somatic dysfunction of cervical region: Secondary | ICD-10-CM | POA: Diagnosis not present

## 2016-06-19 DIAGNOSIS — M542 Cervicalgia: Secondary | ICD-10-CM | POA: Diagnosis not present

## 2016-06-19 DIAGNOSIS — M9902 Segmental and somatic dysfunction of thoracic region: Secondary | ICD-10-CM | POA: Diagnosis not present

## 2016-06-19 DIAGNOSIS — M50322 Other cervical disc degeneration at C5-C6 level: Secondary | ICD-10-CM | POA: Diagnosis not present

## 2016-06-26 DIAGNOSIS — M9905 Segmental and somatic dysfunction of pelvic region: Secondary | ICD-10-CM | POA: Diagnosis not present

## 2016-06-26 DIAGNOSIS — M9903 Segmental and somatic dysfunction of lumbar region: Secondary | ICD-10-CM | POA: Diagnosis not present

## 2016-06-26 DIAGNOSIS — M9904 Segmental and somatic dysfunction of sacral region: Secondary | ICD-10-CM | POA: Diagnosis not present

## 2016-06-26 DIAGNOSIS — M5136 Other intervertebral disc degeneration, lumbar region: Secondary | ICD-10-CM | POA: Diagnosis not present

## 2016-07-03 DIAGNOSIS — M5136 Other intervertebral disc degeneration, lumbar region: Secondary | ICD-10-CM | POA: Diagnosis not present

## 2016-07-03 DIAGNOSIS — M9905 Segmental and somatic dysfunction of pelvic region: Secondary | ICD-10-CM | POA: Diagnosis not present

## 2016-07-03 DIAGNOSIS — M9903 Segmental and somatic dysfunction of lumbar region: Secondary | ICD-10-CM | POA: Diagnosis not present

## 2016-07-03 DIAGNOSIS — M9904 Segmental and somatic dysfunction of sacral region: Secondary | ICD-10-CM | POA: Diagnosis not present

## 2016-07-10 DIAGNOSIS — M9903 Segmental and somatic dysfunction of lumbar region: Secondary | ICD-10-CM | POA: Diagnosis not present

## 2016-07-10 DIAGNOSIS — M9904 Segmental and somatic dysfunction of sacral region: Secondary | ICD-10-CM | POA: Diagnosis not present

## 2016-07-10 DIAGNOSIS — M9905 Segmental and somatic dysfunction of pelvic region: Secondary | ICD-10-CM | POA: Diagnosis not present

## 2016-07-10 DIAGNOSIS — M5136 Other intervertebral disc degeneration, lumbar region: Secondary | ICD-10-CM | POA: Diagnosis not present

## 2016-07-13 DIAGNOSIS — M4316 Spondylolisthesis, lumbar region: Secondary | ICD-10-CM | POA: Diagnosis not present

## 2016-07-17 DIAGNOSIS — M9904 Segmental and somatic dysfunction of sacral region: Secondary | ICD-10-CM | POA: Diagnosis not present

## 2016-07-17 DIAGNOSIS — M9905 Segmental and somatic dysfunction of pelvic region: Secondary | ICD-10-CM | POA: Diagnosis not present

## 2016-07-17 DIAGNOSIS — M5136 Other intervertebral disc degeneration, lumbar region: Secondary | ICD-10-CM | POA: Diagnosis not present

## 2016-07-17 DIAGNOSIS — M9903 Segmental and somatic dysfunction of lumbar region: Secondary | ICD-10-CM | POA: Diagnosis not present

## 2016-07-24 ENCOUNTER — Other Ambulatory Visit: Payer: Self-pay | Admitting: Neurological Surgery

## 2016-07-24 DIAGNOSIS — M5136 Other intervertebral disc degeneration, lumbar region: Secondary | ICD-10-CM | POA: Diagnosis not present

## 2016-07-24 DIAGNOSIS — M9905 Segmental and somatic dysfunction of pelvic region: Secondary | ICD-10-CM | POA: Diagnosis not present

## 2016-07-24 DIAGNOSIS — M9903 Segmental and somatic dysfunction of lumbar region: Secondary | ICD-10-CM | POA: Diagnosis not present

## 2016-07-24 DIAGNOSIS — M9904 Segmental and somatic dysfunction of sacral region: Secondary | ICD-10-CM | POA: Diagnosis not present

## 2016-07-31 DIAGNOSIS — M5136 Other intervertebral disc degeneration, lumbar region: Secondary | ICD-10-CM | POA: Diagnosis not present

## 2016-07-31 DIAGNOSIS — M9905 Segmental and somatic dysfunction of pelvic region: Secondary | ICD-10-CM | POA: Diagnosis not present

## 2016-07-31 DIAGNOSIS — M9903 Segmental and somatic dysfunction of lumbar region: Secondary | ICD-10-CM | POA: Diagnosis not present

## 2016-07-31 DIAGNOSIS — M9904 Segmental and somatic dysfunction of sacral region: Secondary | ICD-10-CM | POA: Diagnosis not present

## 2016-08-01 DIAGNOSIS — M9903 Segmental and somatic dysfunction of lumbar region: Secondary | ICD-10-CM | POA: Diagnosis not present

## 2016-08-01 DIAGNOSIS — M5136 Other intervertebral disc degeneration, lumbar region: Secondary | ICD-10-CM | POA: Diagnosis not present

## 2016-08-01 DIAGNOSIS — M9905 Segmental and somatic dysfunction of pelvic region: Secondary | ICD-10-CM | POA: Diagnosis not present

## 2016-08-01 DIAGNOSIS — M9904 Segmental and somatic dysfunction of sacral region: Secondary | ICD-10-CM | POA: Diagnosis not present

## 2016-08-02 DIAGNOSIS — L739 Follicular disorder, unspecified: Secondary | ICD-10-CM | POA: Diagnosis not present

## 2016-08-02 DIAGNOSIS — L738 Other specified follicular disorders: Secondary | ICD-10-CM | POA: Diagnosis not present

## 2016-08-07 DIAGNOSIS — M5136 Other intervertebral disc degeneration, lumbar region: Secondary | ICD-10-CM | POA: Diagnosis not present

## 2016-08-07 DIAGNOSIS — M9903 Segmental and somatic dysfunction of lumbar region: Secondary | ICD-10-CM | POA: Diagnosis not present

## 2016-08-07 DIAGNOSIS — M9904 Segmental and somatic dysfunction of sacral region: Secondary | ICD-10-CM | POA: Diagnosis not present

## 2016-08-07 DIAGNOSIS — M9905 Segmental and somatic dysfunction of pelvic region: Secondary | ICD-10-CM | POA: Diagnosis not present

## 2016-08-14 DIAGNOSIS — K219 Gastro-esophageal reflux disease without esophagitis: Secondary | ICD-10-CM | POA: Diagnosis not present

## 2016-08-14 DIAGNOSIS — Z0000 Encounter for general adult medical examination without abnormal findings: Secondary | ICD-10-CM | POA: Diagnosis not present

## 2016-08-14 DIAGNOSIS — I10 Essential (primary) hypertension: Secondary | ICD-10-CM | POA: Diagnosis not present

## 2016-08-14 DIAGNOSIS — E785 Hyperlipidemia, unspecified: Secondary | ICD-10-CM | POA: Diagnosis not present

## 2016-08-14 DIAGNOSIS — E119 Type 2 diabetes mellitus without complications: Secondary | ICD-10-CM | POA: Diagnosis not present

## 2016-08-14 DIAGNOSIS — Z125 Encounter for screening for malignant neoplasm of prostate: Secondary | ICD-10-CM | POA: Diagnosis not present

## 2016-08-15 DIAGNOSIS — M9903 Segmental and somatic dysfunction of lumbar region: Secondary | ICD-10-CM | POA: Diagnosis not present

## 2016-08-15 DIAGNOSIS — M9904 Segmental and somatic dysfunction of sacral region: Secondary | ICD-10-CM | POA: Diagnosis not present

## 2016-08-15 DIAGNOSIS — M9905 Segmental and somatic dysfunction of pelvic region: Secondary | ICD-10-CM | POA: Diagnosis not present

## 2016-08-15 DIAGNOSIS — M5136 Other intervertebral disc degeneration, lumbar region: Secondary | ICD-10-CM | POA: Diagnosis not present

## 2016-08-15 DIAGNOSIS — M9906 Segmental and somatic dysfunction of lower extremity: Secondary | ICD-10-CM | POA: Diagnosis not present

## 2016-08-17 DIAGNOSIS — M79672 Pain in left foot: Secondary | ICD-10-CM | POA: Diagnosis not present

## 2016-08-17 DIAGNOSIS — I10 Essential (primary) hypertension: Secondary | ICD-10-CM | POA: Diagnosis not present

## 2016-08-17 DIAGNOSIS — M79671 Pain in right foot: Secondary | ICD-10-CM | POA: Diagnosis not present

## 2016-08-17 DIAGNOSIS — E785 Hyperlipidemia, unspecified: Secondary | ICD-10-CM | POA: Diagnosis not present

## 2016-08-17 DIAGNOSIS — K219 Gastro-esophageal reflux disease without esophagitis: Secondary | ICD-10-CM | POA: Diagnosis not present

## 2016-08-17 DIAGNOSIS — R413 Other amnesia: Secondary | ICD-10-CM | POA: Diagnosis not present

## 2016-08-17 DIAGNOSIS — Z1212 Encounter for screening for malignant neoplasm of rectum: Secondary | ICD-10-CM | POA: Diagnosis not present

## 2016-08-17 DIAGNOSIS — M549 Dorsalgia, unspecified: Secondary | ICD-10-CM | POA: Diagnosis not present

## 2016-08-17 DIAGNOSIS — K589 Irritable bowel syndrome without diarrhea: Secondary | ICD-10-CM | POA: Diagnosis not present

## 2016-08-17 DIAGNOSIS — E041 Nontoxic single thyroid nodule: Secondary | ICD-10-CM | POA: Diagnosis not present

## 2016-08-17 DIAGNOSIS — N401 Enlarged prostate with lower urinary tract symptoms: Secondary | ICD-10-CM | POA: Diagnosis not present

## 2016-08-17 DIAGNOSIS — Z0001 Encounter for general adult medical examination with abnormal findings: Secondary | ICD-10-CM | POA: Diagnosis not present

## 2016-08-17 DIAGNOSIS — M4120 Other idiopathic scoliosis, site unspecified: Secondary | ICD-10-CM | POA: Diagnosis not present

## 2016-08-21 DIAGNOSIS — M9903 Segmental and somatic dysfunction of lumbar region: Secondary | ICD-10-CM | POA: Diagnosis not present

## 2016-08-21 DIAGNOSIS — M9904 Segmental and somatic dysfunction of sacral region: Secondary | ICD-10-CM | POA: Diagnosis not present

## 2016-08-21 DIAGNOSIS — M5136 Other intervertebral disc degeneration, lumbar region: Secondary | ICD-10-CM | POA: Diagnosis not present

## 2016-08-21 DIAGNOSIS — M9905 Segmental and somatic dysfunction of pelvic region: Secondary | ICD-10-CM | POA: Diagnosis not present

## 2016-08-28 DIAGNOSIS — M9903 Segmental and somatic dysfunction of lumbar region: Secondary | ICD-10-CM | POA: Diagnosis not present

## 2016-08-28 DIAGNOSIS — M9905 Segmental and somatic dysfunction of pelvic region: Secondary | ICD-10-CM | POA: Diagnosis not present

## 2016-08-28 DIAGNOSIS — M5136 Other intervertebral disc degeneration, lumbar region: Secondary | ICD-10-CM | POA: Diagnosis not present

## 2016-08-28 DIAGNOSIS — M9904 Segmental and somatic dysfunction of sacral region: Secondary | ICD-10-CM | POA: Diagnosis not present

## 2016-09-04 DIAGNOSIS — M5136 Other intervertebral disc degeneration, lumbar region: Secondary | ICD-10-CM | POA: Diagnosis not present

## 2016-09-04 DIAGNOSIS — M9903 Segmental and somatic dysfunction of lumbar region: Secondary | ICD-10-CM | POA: Diagnosis not present

## 2016-09-04 DIAGNOSIS — M9904 Segmental and somatic dysfunction of sacral region: Secondary | ICD-10-CM | POA: Diagnosis not present

## 2016-09-04 DIAGNOSIS — M9905 Segmental and somatic dysfunction of pelvic region: Secondary | ICD-10-CM | POA: Diagnosis not present

## 2016-09-06 ENCOUNTER — Encounter (HOSPITAL_COMMUNITY)
Admission: RE | Admit: 2016-09-06 | Discharge: 2016-09-06 | Disposition: A | Discharge status: Home / Self Care | Payer: PPO | Source: Ambulatory Visit | Attending: Neurological Surgery | Admitting: Neurological Surgery

## 2016-09-06 ENCOUNTER — Encounter (HOSPITAL_COMMUNITY): Payer: Self-pay

## 2016-09-06 DIAGNOSIS — Z01810 Encounter for preprocedural cardiovascular examination: Secondary | ICD-10-CM | POA: Insufficient documentation

## 2016-09-06 DIAGNOSIS — Z01818 Encounter for other preprocedural examination: Secondary | ICD-10-CM | POA: Diagnosis not present

## 2016-09-06 DIAGNOSIS — M4316 Spondylolisthesis, lumbar region: Secondary | ICD-10-CM | POA: Insufficient documentation

## 2016-09-06 HISTORY — DX: Gastro-esophageal reflux disease without esophagitis: K21.9

## 2016-09-06 HISTORY — DX: Dementia in other diseases classified elsewhere, unspecified severity, without behavioral disturbance, psychotic disturbance, mood disturbance, and anxiety: F02.80

## 2016-09-06 HISTORY — DX: Essential (primary) hypertension: I10

## 2016-09-06 HISTORY — DX: Unspecified osteoarthritis, unspecified site: M19.90

## 2016-09-06 HISTORY — DX: Alzheimer's disease, unspecified: G30.9

## 2016-09-06 LAB — SURGICAL PCR SCREEN
MRSA, PCR: NEGATIVE
Staphylococcus aureus: NEGATIVE

## 2016-09-06 LAB — TYPE AND SCREEN
ABO/RH(D): A POS
Antibody Screen: NEGATIVE

## 2016-09-06 LAB — BASIC METABOLIC PANEL
ANION GAP: 8 (ref 5–15)
BUN: 19 mg/dL (ref 6–20)
CO2: 26 mmol/L (ref 22–32)
Calcium: 9.9 mg/dL (ref 8.9–10.3)
Chloride: 102 mmol/L (ref 101–111)
Creatinine, Ser: 0.98 mg/dL (ref 0.61–1.24)
GFR calc Af Amer: >60 mL/min (ref >60)
GLUCOSE: 191 mg/dL — AB (ref 65–99)
POTASSIUM: 4.2 mmol/L (ref 3.5–5.1)
Sodium: 136 mmol/L (ref 135–145)

## 2016-09-06 LAB — CBC
HEMATOCRIT: 40.9 % (ref 39.0–52.0)
HEMOGLOBIN: 14 g/dL (ref 13.0–17.0)
MCH: 31.8 pg (ref 26.0–34.0)
MCHC: 34.2 g/dL (ref 30.0–36.0)
MCV: 93 fL (ref 78.0–100.0)
Platelets: 200 10*3/uL (ref 150–400)
RBC: 4.4 MIL/uL (ref 4.22–5.81)
RDW: 12.7 % (ref 11.5–15.5)
WBC: 7.1 10*3/uL (ref 4.0–10.5)

## 2016-09-06 LAB — GLUCOSE, CAPILLARY: Glucose-Capillary: 193 mg/dL — ABNORMAL HIGH (ref 65–99)

## 2016-09-06 LAB — HEMOGLOBIN A1C
Hgb A1c MFr Bld: 6.9 % — ABNORMAL HIGH (ref 4.8–5.6)
MEAN PLASMA GLUCOSE: 151.33 mg/dL

## 2016-09-06 LAB — ABO/RH: ABO/RH(D): A POS

## 2016-09-06 NOTE — Pre-Procedure Instructions (Addendum)
Daniel [name]  09/06/2016      [id number] - [place], [place] - [address] AT [place] [address] [place] [address] Phone: [phone] Fax: [phone]  [facility] - [place], [place] [address] Phone: [phone] Fax: [phone]  [facility] [address] [place], [place] [address] Phone: [phone] Fax: [phone]    Your procedure is scheduled on 09/12/16.  Report to [facility] Admitting at 810 A.M.  Call this number if you have problems the morning of surgery:  [phone]   Remember:  Do not eat food or drink liquids after midnight.  Take these medicines the morning of surgery with A SIP OF WATER     Lyrica, pantoprazole(protonix),exelon  STOP all herbel meds, nsaids (aleve,naproxen,advil,ibuprofen)  prior to surgery starting today 09/06/16 including all vitamins/supplements,aspirin,celebrex,fish oil  No metformin am of surgery   Do not wear jewelry, make-up or nail polish.  Do not wear lotions, powders, or perfumes, or deoderant.  Do not shave 48 hours prior to surgery.  Men may shave face and neck.  Do not bring valuables to the hospital.  [facility] is not responsible for any belongings or valuables.  Contacts, dentures or bridgework may not be worn into surgery.  Leave your suitcase in the car.  After surgery it may be brought to your room.  For patients admitted to the hospital, discharge time will be determined by your treatment team.  Patients discharged the day of surgery will not be allowed to drive home.   Special instructions:   Special Instructions: [place] - Preparing for Surgery  Before surgery, you can play an important role.  Because skin is not sterile, your skin needs to be as free of germs as  possible.  You can reduce the number of germs on you skin by washing with CHG (chlorahexidine gluconate) soap before surgery.  CHG is an antiseptic cleaner which kills germs and bonds with the skin to continue killing germs even after washing.  Please DO NOT use if you have an allergy to CHG or antibacterial soaps.  If your skin becomes reddened/irritated stop using the CHG and inform your nurse when you arrive at Short Stay.  Do not shave (including legs and underarms) for at least 48 hours prior to the first CHG shower.  You may shave your face.  Please follow these instructions carefully:   1.  Shower with CHG Soap the night before surgery and the morning of Surgery.  2.  If you choose to wash your hair, wash your hair first as usual with your normal shampoo.  3.  After you shampoo, rinse your hair and body thoroughly to remove the Shampoo.  4.  Use CHG as you would any other liquid soap.  You can apply chg directly  to the skin and wash gently with scrungie or a clean washcloth.  5.  Apply the CHG Soap to your body ONLY FROM THE NECK DOWN.  Do not use on open wounds or open sores.  Avoid contact with your eyes ears, mouth and genitals (private parts).  Wash genitals (private parts)       with your normal soap.  6.  Wash thoroughly, paying special attention to the area where your surgery will be performed.  7.  Thoroughly  rinse your body with warm water from the neck down.  8.  DO NOT shower/wash with your normal soap after using and rinsing off the CHG Soap.  9.  Pat yourself dry with a clean towel.            10.  Wear clean pajamas.            11.  Place clean sheets on your bed the night of your first shower and do not sleep with pets.  Day of Surgery  Do not apply any lotions/deodorants the morning of surgery.  Please wear clean clothes to the hospital/surgery center.  Please read over the  fact sheets that you were given.

## 2016-09-11 DIAGNOSIS — M5136 Other intervertebral disc degeneration, lumbar region: Secondary | ICD-10-CM | POA: Diagnosis not present

## 2016-09-11 DIAGNOSIS — M9905 Segmental and somatic dysfunction of pelvic region: Secondary | ICD-10-CM | POA: Diagnosis not present

## 2016-09-11 DIAGNOSIS — M9903 Segmental and somatic dysfunction of lumbar region: Secondary | ICD-10-CM | POA: Diagnosis not present

## 2016-09-11 DIAGNOSIS — M9904 Segmental and somatic dysfunction of sacral region: Secondary | ICD-10-CM | POA: Diagnosis not present

## 2016-09-12 ENCOUNTER — Inpatient Hospital Stay (HOSPITAL_COMMUNITY): Payer: PPO

## 2016-09-12 ENCOUNTER — Inpatient Hospital Stay (HOSPITAL_COMMUNITY)
Admission: RE | Admit: 2016-09-12 | Discharge: 2016-09-20 | DRG: 460 | Disposition: A | Discharge status: Home / Self Care | Payer: PPO | Source: Ambulatory Visit | Attending: Neurological Surgery | Admitting: Neurological Surgery

## 2016-09-12 ENCOUNTER — Inpatient Hospital Stay (HOSPITAL_COMMUNITY)
Admission: RE | Disposition: A | Discharge status: Home / Self Care | Payer: Self-pay | Source: Ambulatory Visit | Attending: Neurological Surgery

## 2016-09-12 ENCOUNTER — Encounter (HOSPITAL_COMMUNITY): Payer: Self-pay | Admitting: *Deleted

## 2016-09-12 DIAGNOSIS — M549 Dorsalgia, unspecified: Secondary | ICD-10-CM | POA: Diagnosis not present

## 2016-09-12 DIAGNOSIS — M48061 Spinal stenosis, lumbar region without neurogenic claudication: Secondary | ICD-10-CM | POA: Diagnosis not present

## 2016-09-12 DIAGNOSIS — M48062 Spinal stenosis, lumbar region with neurogenic claudication: Secondary | ICD-10-CM | POA: Diagnosis not present

## 2016-09-12 DIAGNOSIS — Z419 Encounter for procedure for purposes other than remedying health state, unspecified: Secondary | ICD-10-CM | POA: Diagnosis not present

## 2016-09-12 DIAGNOSIS — R278 Other lack of coordination: Secondary | ICD-10-CM | POA: Diagnosis not present

## 2016-09-12 DIAGNOSIS — M1990 Unspecified osteoarthritis, unspecified site: Secondary | ICD-10-CM | POA: Diagnosis not present

## 2016-09-12 DIAGNOSIS — Z5189 Encounter for other specified aftercare: Secondary | ICD-10-CM | POA: Diagnosis not present

## 2016-09-12 DIAGNOSIS — E785 Hyperlipidemia, unspecified: Secondary | ICD-10-CM | POA: Diagnosis present

## 2016-09-12 DIAGNOSIS — G309 Alzheimer's disease, unspecified: Secondary | ICD-10-CM | POA: Diagnosis present

## 2016-09-12 DIAGNOSIS — D696 Thrombocytopenia, unspecified: Secondary | ICD-10-CM

## 2016-09-12 DIAGNOSIS — D62 Acute posthemorrhagic anemia: Secondary | ICD-10-CM | POA: Diagnosis not present

## 2016-09-12 DIAGNOSIS — G8929 Other chronic pain: Secondary | ICD-10-CM

## 2016-09-12 DIAGNOSIS — G9611 Dural tear: Secondary | ICD-10-CM | POA: Diagnosis not present

## 2016-09-12 DIAGNOSIS — E1140 Type 2 diabetes mellitus with diabetic neuropathy, unspecified: Secondary | ICD-10-CM | POA: Diagnosis present

## 2016-09-12 DIAGNOSIS — Y838 Other surgical procedures as the cause of abnormal reaction of the patient, or of later complication, without mention of misadventure at the time of the procedure: Secondary | ICD-10-CM | POA: Diagnosis present

## 2016-09-12 DIAGNOSIS — M5416 Radiculopathy, lumbar region: Secondary | ICD-10-CM | POA: Diagnosis not present

## 2016-09-12 DIAGNOSIS — D72829 Elevated white blood cell count, unspecified: Secondary | ICD-10-CM

## 2016-09-12 DIAGNOSIS — M4716 Other spondylosis with myelopathy, lumbar region: Secondary | ICD-10-CM | POA: Diagnosis not present

## 2016-09-12 DIAGNOSIS — Z9841 Cataract extraction status, right eye: Secondary | ICD-10-CM | POA: Diagnosis not present

## 2016-09-12 DIAGNOSIS — I9581 Postprocedural hypotension: Secondary | ICD-10-CM | POA: Diagnosis not present

## 2016-09-12 DIAGNOSIS — Z9889 Other specified postprocedural states: Secondary | ICD-10-CM

## 2016-09-12 DIAGNOSIS — Z888 Allergy status to other drugs, medicaments and biological substances status: Secondary | ICD-10-CM | POA: Diagnosis not present

## 2016-09-12 DIAGNOSIS — Z87891 Personal history of nicotine dependence: Secondary | ICD-10-CM | POA: Diagnosis not present

## 2016-09-12 DIAGNOSIS — K219 Gastro-esophageal reflux disease without esophagitis: Secondary | ICD-10-CM | POA: Diagnosis present

## 2016-09-12 DIAGNOSIS — Z9842 Cataract extraction status, left eye: Secondary | ICD-10-CM | POA: Diagnosis not present

## 2016-09-12 DIAGNOSIS — G8918 Other acute postprocedural pain: Secondary | ICD-10-CM | POA: Diagnosis not present

## 2016-09-12 DIAGNOSIS — M4726 Other spondylosis with radiculopathy, lumbar region: Secondary | ICD-10-CM | POA: Diagnosis present

## 2016-09-12 DIAGNOSIS — E1142 Type 2 diabetes mellitus with diabetic polyneuropathy: Secondary | ICD-10-CM | POA: Diagnosis present

## 2016-09-12 DIAGNOSIS — I10 Essential (primary) hypertension: Secondary | ICD-10-CM | POA: Diagnosis not present

## 2016-09-12 DIAGNOSIS — M6281 Muscle weakness (generalized): Secondary | ICD-10-CM | POA: Diagnosis not present

## 2016-09-12 DIAGNOSIS — Z85828 Personal history of other malignant neoplasm of skin: Secondary | ICD-10-CM

## 2016-09-12 DIAGNOSIS — R2681 Unsteadiness on feet: Secondary | ICD-10-CM | POA: Diagnosis not present

## 2016-09-12 DIAGNOSIS — R488 Other symbolic dysfunctions: Secondary | ICD-10-CM | POA: Diagnosis not present

## 2016-09-12 DIAGNOSIS — Z820 Family history of epilepsy and other diseases of the nervous system: Secondary | ICD-10-CM

## 2016-09-12 DIAGNOSIS — Z79899 Other long term (current) drug therapy: Secondary | ICD-10-CM

## 2016-09-12 DIAGNOSIS — G9741 Accidental puncture or laceration of dura during a procedure: Secondary | ICD-10-CM | POA: Diagnosis not present

## 2016-09-12 DIAGNOSIS — F0280 Dementia in other diseases classified elsewhere without behavioral disturbance: Secondary | ICD-10-CM | POA: Diagnosis not present

## 2016-09-12 DIAGNOSIS — M4316 Spondylolisthesis, lumbar region: Secondary | ICD-10-CM | POA: Diagnosis not present

## 2016-09-12 DIAGNOSIS — M4326 Fusion of spine, lumbar region: Secondary | ICD-10-CM | POA: Diagnosis not present

## 2016-09-12 DIAGNOSIS — M4306 Spondylolysis, lumbar region: Secondary | ICD-10-CM | POA: Diagnosis not present

## 2016-09-12 DIAGNOSIS — M4711 Other spondylosis with myelopathy, occipito-atlanto-axial region: Secondary | ICD-10-CM | POA: Diagnosis not present

## 2016-09-12 LAB — GLUCOSE, CAPILLARY
Glucose-Capillary: 162 mg/dL — ABNORMAL HIGH (ref 65–99)
Glucose-Capillary: 176 mg/dL — ABNORMAL HIGH (ref 65–99)

## 2016-09-12 SURGERY — POSTERIOR LUMBAR FUSION 2 LEVEL
Anesthesia: General | Site: Back

## 2016-09-12 MED ORDER — SUGAMMADEX SODIUM 200 MG/2ML IV SOLN
INTRAVENOUS | Status: DC | PRN
  Administered 2016-09-12: 160 mg via INTRAVENOUS

## 2016-09-12 MED ORDER — LIDOCAINE-EPINEPHRINE 1 %-1:100000 IJ SOLN
INTRAMUSCULAR | Status: DC | PRN
  Administered 2016-09-12: 5 mL

## 2016-09-12 MED ORDER — LACTATED RINGERS IV SOLN
INTRAVENOUS | Status: DC
  Administered 2016-09-12: 20:00:00 via INTRAVENOUS

## 2016-09-12 MED ORDER — FENTANYL CITRATE (PF) 100 MCG/2ML IJ SOLN
INTRAMUSCULAR | Status: DC | PRN
  Administered 2016-09-12 (×2): 50 ug via INTRAVENOUS
  Administered 2016-09-12: 100 ug via INTRAVENOUS
  Administered 2016-09-12 (×4): 50 ug via INTRAVENOUS

## 2016-09-12 MED ORDER — FENTANYL CITRATE (PF) 250 MCG/5ML IJ SOLN
INTRAMUSCULAR | Status: AC
  Filled 2016-09-12: qty 5

## 2016-09-12 MED ORDER — THROMBIN 20000 UNITS EX SOLR
CUTANEOUS | Status: DC | PRN
  Administered 2016-09-12: 12:00:00 via TOPICAL

## 2016-09-12 MED ORDER — GLYCOPYRROLATE 0.2 MG/ML IJ SOLN
INTRAMUSCULAR | Status: DC | PRN
  Administered 2016-09-12: 0.1 mg via INTRAVENOUS

## 2016-09-12 MED ORDER — MEPERIDINE HCL 25 MG/ML IJ SOLN: 6.2500 mg | INTRAMUSCULAR | Status: DC | PRN

## 2016-09-12 MED ORDER — PHENYLEPHRINE HCL 10 MG/ML IJ SOLN
INTRAMUSCULAR | Status: DC | PRN
  Administered 2016-09-12 (×2): 40 ug via INTRAVENOUS

## 2016-09-12 MED ORDER — LIDOCAINE 2% (20 MG/ML) 5 ML SYRINGE
INTRAMUSCULAR | Status: AC
  Filled 2016-09-12: qty 5

## 2016-09-12 MED ORDER — CHLORHEXIDINE GLUCONATE CLOTH 2 % EX PADS: 6.0000 | MEDICATED_PAD | Freq: Once | CUTANEOUS | Status: DC

## 2016-09-12 MED ORDER — THROMBIN 5000 UNITS EX SOLR
CUTANEOUS | Status: AC
  Filled 2016-09-12: qty 5000

## 2016-09-12 MED ORDER — FENTANYL CITRATE (PF) 100 MCG/2ML IJ SOLN
INTRAMUSCULAR | Status: AC
  Filled 2016-09-12: qty 2

## 2016-09-12 MED ORDER — METFORMIN HCL 500 MG PO TABS
500.0000 mg | ORAL_TABLET | Freq: Two times a day (BID) | ORAL | Status: DC
  Administered 2016-09-13 – 2016-09-20 (×14): 500 mg via ORAL
  Filled 2016-09-12 (×14): qty 1

## 2016-09-12 MED ORDER — SUGAMMADEX SODIUM 200 MG/2ML IV SOLN
INTRAVENOUS | Status: AC
  Filled 2016-09-12: qty 2

## 2016-09-12 MED ORDER — ROCURONIUM BROMIDE 100 MG/10ML IV SOLN
INTRAVENOUS | Status: DC | PRN
  Administered 2016-09-12: 30 mg via INTRAVENOUS
  Administered 2016-09-12: 20 mg via INTRAVENOUS
  Administered 2016-09-12: 50 mg via INTRAVENOUS
  Administered 2016-09-12: 20 mg via INTRAVENOUS
  Administered 2016-09-12 (×2): 30 mg via INTRAVENOUS

## 2016-09-12 MED ORDER — ONDANSETRON HCL 4 MG PO TABS: 4.0000 mg | ORAL_TABLET | Freq: Four times a day (QID) | ORAL | Status: DC | PRN

## 2016-09-12 MED ORDER — ACETAMINOPHEN 325 MG PO TABS
650.0000 mg | ORAL_TABLET | ORAL | Status: DC | PRN
  Administered 2016-09-17: 650 mg via ORAL
  Filled 2016-09-12: qty 2

## 2016-09-12 MED ORDER — SODIUM CHLORIDE 0.9 % IV SOLN
INTRAVENOUS | Status: DC | PRN
  Administered 2016-09-12: 17:00:00 via INTRAVENOUS

## 2016-09-12 MED ORDER — SODIUM CHLORIDE 0.9% FLUSH: 3.0000 mL | INTRAVENOUS | Status: DC | PRN

## 2016-09-12 MED ORDER — EZETIMIBE 10 MG PO TABS
10.0000 mg | ORAL_TABLET | Freq: Every evening | ORAL | Status: DC
  Administered 2016-09-13 – 2016-09-19 (×8): 10 mg via ORAL
  Filled 2016-09-12 (×8): qty 1

## 2016-09-12 MED ORDER — BUPIVACAINE HCL (PF) 0.5 % IJ SOLN
INTRAMUSCULAR | Status: AC
  Filled 2016-09-12: qty 30

## 2016-09-12 MED ORDER — CEFAZOLIN SODIUM-DEXTROSE 2-4 GM/100ML-% IV SOLN
2.0000 g | INTRAVENOUS | Status: AC
  Administered 2016-09-12 (×2): 2 g via INTRAVENOUS
  Filled 2016-09-12: qty 100

## 2016-09-12 MED ORDER — DOCUSATE SODIUM 100 MG PO CAPS
100.0000 mg | ORAL_CAPSULE | Freq: Two times a day (BID) | ORAL | Status: DC
  Administered 2016-09-13 – 2016-09-20 (×14): 100 mg via ORAL
  Filled 2016-09-12 (×17): qty 1

## 2016-09-12 MED ORDER — LIDOCAINE-EPINEPHRINE 1 %-1:100000 IJ SOLN
INTRAMUSCULAR | Status: AC
  Filled 2016-09-12: qty 1

## 2016-09-12 MED ORDER — PHENYLEPHRINE HCL 10 MG/ML IJ SOLN
INTRAMUSCULAR | Status: DC | PRN
  Administered 2016-09-12: 10 ug/min via INTRAVENOUS

## 2016-09-12 MED ORDER — METHOCARBAMOL 500 MG PO TABS
500.0000 mg | ORAL_TABLET | Freq: Four times a day (QID) | ORAL | Status: DC | PRN
  Administered 2016-09-14 – 2016-09-19 (×5): 500 mg via ORAL
  Filled 2016-09-12 (×7): qty 1

## 2016-09-12 MED ORDER — THROMBIN 20000 UNITS EX SOLR
CUTANEOUS | Status: AC
  Filled 2016-09-12: qty 20000

## 2016-09-12 MED ORDER — 0.9 % SODIUM CHLORIDE (POUR BTL) OPTIME
TOPICAL | Status: DC | PRN
  Administered 2016-09-12: 1000 mL

## 2016-09-12 MED ORDER — CEFAZOLIN SODIUM-DEXTROSE 2-4 GM/100ML-% IV SOLN
2.0000 g | Freq: Three times a day (TID) | INTRAVENOUS | Status: AC
  Administered 2016-09-13 (×2): 2 g via INTRAVENOUS
  Filled 2016-09-12 (×2): qty 100

## 2016-09-12 MED ORDER — HYDROCODONE-ACETAMINOPHEN 5-325 MG PO TABS
1.0000 | ORAL_TABLET | ORAL | Status: DC | PRN
  Administered 2016-09-13 (×3): 2 via ORAL
  Administered 2016-09-14 – 2016-09-15 (×4): 1 via ORAL
  Administered 2016-09-15: 2 via ORAL
  Administered 2016-09-17: 1 via ORAL
  Administered 2016-09-17 – 2016-09-20 (×8): 2 via ORAL
  Administered 2016-09-20: 1 via ORAL
  Filled 2016-09-12: qty 1
  Filled 2016-09-12 (×3): qty 2
  Filled 2016-09-12 (×2): qty 1
  Filled 2016-09-12: qty 2
  Filled 2016-09-12 (×2): qty 1
  Filled 2016-09-12 (×4): qty 2
  Filled 2016-09-12: qty 1
  Filled 2016-09-12 (×4): qty 2

## 2016-09-12 MED ORDER — POLYETHYLENE GLYCOL 3350 17 G PO PACK
17.0000 g | PACK | Freq: Every day | ORAL | Status: DC | PRN
Start: 2016-09-12 — End: 2016-09-20

## 2016-09-12 MED ORDER — BUPIVACAINE HCL (PF) 0.5 % IJ SOLN
INTRAMUSCULAR | Status: DC | PRN
  Administered 2016-09-12: 5 mL
  Administered 2016-09-12: 20 mL

## 2016-09-12 MED ORDER — ONDANSETRON HCL 4 MG/2ML IJ SOLN
INTRAMUSCULAR | Status: AC
  Filled 2016-09-12: qty 2

## 2016-09-12 MED ORDER — ONDANSETRON HCL 4 MG/2ML IJ SOLN: 4.0000 mg | Freq: Four times a day (QID) | INTRAMUSCULAR | Status: DC | PRN

## 2016-09-12 MED ORDER — FENTANYL CITRATE (PF) 100 MCG/2ML IJ SOLN
25.0000 ug | INTRAMUSCULAR | Status: DC | PRN
  Administered 2016-09-12 (×2): 50 ug via INTRAVENOUS

## 2016-09-12 MED ORDER — THROMBIN 5000 UNITS EX SOLR
CUTANEOUS | Status: DC | PRN
  Administered 2016-09-12 (×2): via TOPICAL

## 2016-09-12 MED ORDER — MENTHOL 3 MG MT LOZG: 1.0000 | LOZENGE | OROMUCOSAL | Status: DC | PRN

## 2016-09-12 MED ORDER — PHENOL 1.4 % MT LIQD
1.0000 | OROMUCOSAL | Status: DC | PRN
  Administered 2016-09-13 – 2016-09-18 (×2): 1 via OROMUCOSAL
  Filled 2016-09-12 (×2): qty 177

## 2016-09-12 MED ORDER — SODIUM CHLORIDE 0.9 % IV SOLN: 250.0000 mL | INTRAVENOUS | Status: DC

## 2016-09-12 MED ORDER — ROCURONIUM BROMIDE 10 MG/ML (PF) SYRINGE
PREFILLED_SYRINGE | INTRAVENOUS | Status: AC
  Filled 2016-09-12: qty 5

## 2016-09-12 MED ORDER — METHOCARBAMOL 1000 MG/10ML IJ SOLN
500.0000 mg | Freq: Four times a day (QID) | INTRAVENOUS | Status: DC | PRN
  Filled 2016-09-12: qty 5

## 2016-09-12 MED ORDER — RAMIPRIL 2.5 MG PO CAPS
2.5000 mg | ORAL_CAPSULE | Freq: Every day | ORAL | Status: DC
  Administered 2016-09-13 – 2016-09-20 (×9): 2.5 mg via ORAL
  Filled 2016-09-12 (×9): qty 1

## 2016-09-12 MED ORDER — LIDOCAINE HCL (CARDIAC) 20 MG/ML IV SOLN
INTRAVENOUS | Status: DC | PRN
  Administered 2016-09-12: 60 mg via INTRAVENOUS

## 2016-09-12 MED ORDER — PANTOPRAZOLE SODIUM 40 MG PO TBEC
40.0000 mg | DELAYED_RELEASE_TABLET | Freq: Every day | ORAL | Status: DC
Start: 2016-09-12 — End: 2016-09-20
  Administered 2016-09-13 – 2016-09-20 (×9): 40 mg via ORAL
  Filled 2016-09-12 (×9): qty 1

## 2016-09-12 MED ORDER — INSULIN ASPART 100 UNIT/ML ~~LOC~~ SOLN
0.0000 [IU] | Freq: Three times a day (TID) | SUBCUTANEOUS | Status: DC
  Administered 2016-09-13: 8 [IU] via SUBCUTANEOUS
  Administered 2016-09-13 – 2016-09-14 (×3): 3 [IU] via SUBCUTANEOUS
  Administered 2016-09-14: 5 [IU] via SUBCUTANEOUS
  Administered 2016-09-14: 3 [IU] via SUBCUTANEOUS
  Administered 2016-09-15 – 2016-09-17 (×7): 5 [IU] via SUBCUTANEOUS
  Administered 2016-09-17: 3 [IU] via SUBCUTANEOUS
  Administered 2016-09-17: 2 [IU] via SUBCUTANEOUS
  Administered 2016-09-18: 3 [IU] via SUBCUTANEOUS
  Administered 2016-09-18: 5 [IU] via SUBCUTANEOUS
  Administered 2016-09-18 – 2016-09-19 (×3): 3 [IU] via SUBCUTANEOUS
  Administered 2016-09-19: 5 [IU] via SUBCUTANEOUS
  Administered 2016-09-20: 3 [IU] via SUBCUTANEOUS
  Administered 2016-09-20: 5 [IU] via SUBCUTANEOUS

## 2016-09-12 MED ORDER — ALBUMIN HUMAN 5 % IV SOLN
INTRAVENOUS | Status: DC | PRN
  Administered 2016-09-12 (×2): via INTRAVENOUS

## 2016-09-12 MED ORDER — ATORVASTATIN CALCIUM 40 MG PO TABS
40.0000 mg | ORAL_TABLET | Freq: Every day | ORAL | Status: DC
  Administered 2016-09-13 – 2016-09-19 (×7): 40 mg via ORAL
  Filled 2016-09-12 (×7): qty 1

## 2016-09-12 MED ORDER — BISACODYL 10 MG RE SUPP: 10.0000 mg | Freq: Every day | RECTAL | Status: DC | PRN

## 2016-09-12 MED ORDER — ACETAMINOPHEN 650 MG RE SUPP: 650.0000 mg | RECTAL | Status: DC | PRN

## 2016-09-12 MED ORDER — HYDROMORPHONE HCL 1 MG/ML IJ SOLN
0.5000 mg | INTRAMUSCULAR | Status: DC | PRN
  Administered 2016-09-12 – 2016-09-13 (×3): 0.5 mg via INTRAVENOUS
  Filled 2016-09-12 (×3): qty 0.5

## 2016-09-12 MED ORDER — PHENYLEPHRINE 40 MCG/ML (10ML) SYRINGE FOR IV PUSH (FOR BLOOD PRESSURE SUPPORT)
PREFILLED_SYRINGE | INTRAVENOUS | Status: AC
  Filled 2016-09-12: qty 10

## 2016-09-12 MED ORDER — PROPOFOL 10 MG/ML IV BOLUS
INTRAVENOUS | Status: DC | PRN
  Administered 2016-09-12: 150 mg via INTRAVENOUS

## 2016-09-12 MED ORDER — ONDANSETRON HCL 4 MG/2ML IJ SOLN
INTRAMUSCULAR | Status: DC | PRN
  Administered 2016-09-12: 4 mg via INTRAVENOUS

## 2016-09-12 MED ORDER — LACTATED RINGERS IV SOLN
INTRAVENOUS | Status: DC
  Administered 2016-09-12 (×2): via INTRAVENOUS

## 2016-09-12 MED ORDER — FLEET ENEMA 7-19 GM/118ML RE ENEM: 1.0000 | ENEMA | Freq: Once | RECTAL | Status: DC | PRN

## 2016-09-12 MED ORDER — SENNA 8.6 MG PO TABS
1.0000 | ORAL_TABLET | Freq: Two times a day (BID) | ORAL | Status: DC
  Administered 2016-09-13 – 2016-09-20 (×13): 8.6 mg via ORAL
  Filled 2016-09-12 (×16): qty 1

## 2016-09-12 MED ORDER — PREGABALIN 25 MG PO CAPS
50.0000 mg | ORAL_CAPSULE | Freq: Two times a day (BID) | ORAL | Status: DC
  Administered 2016-09-13 – 2016-09-20 (×16): 50 mg via ORAL
  Filled 2016-09-12 (×8): qty 2
  Filled 2016-09-12: qty 1
  Filled 2016-09-12 (×2): qty 2
  Filled 2016-09-12: qty 1
  Filled 2016-09-12 (×4): qty 2

## 2016-09-12 MED ORDER — SODIUM CHLORIDE 0.9% FLUSH
3.0000 mL | Freq: Two times a day (BID) | INTRAVENOUS | Status: DC
  Administered 2016-09-13 – 2016-09-15 (×7): 3 mL via INTRAVENOUS
  Administered 2016-09-16: 10 mL via INTRAVENOUS
  Administered 2016-09-16 – 2016-09-19 (×6): 3 mL via INTRAVENOUS

## 2016-09-12 MED ORDER — PROPOFOL 10 MG/ML IV BOLUS
INTRAVENOUS | Status: AC
  Filled 2016-09-12: qty 20

## 2016-09-12 MED ORDER — HEMOSTATIC AGENTS (NO CHARGE) OPTIME
TOPICAL | Status: DC | PRN
  Administered 2016-09-12: 1 via TOPICAL

## 2016-09-12 MED ORDER — BACITRACIN 50000 UNITS IM SOLR
INTRAMUSCULAR | Status: DC | PRN
  Administered 2016-09-12: 12:00:00

## 2016-09-12 SURGICAL SUPPLY — 76 items
ADH SKN CLS APL DERMABOND .7 (GAUZE/BANDAGES/DRESSINGS) ×1
APL SRG 60D 8 XTD TIP BNDBL (TIP)
BAG DECANTER FOR FLEXI CONT (MISCELLANEOUS) ×3 IMPLANT
BASKET BONE COLLECTION (BASKET) ×3 IMPLANT
BLADE CLIPPER SURG (BLADE) IMPLANT
BONE EQUIVA 5CC (Bone Implant) ×4 IMPLANT
BUR MATCHSTICK NEURO 3.0 LAGG (BURR) ×3 IMPLANT
CANISTER SUCT 3000ML PPV (MISCELLANEOUS) ×3 IMPLANT
CARTRIDGE OIL MAESTRO DRILL (MISCELLANEOUS) ×1 IMPLANT
CONT SPEC 4OZ CLIKSEAL STRL BL (MISCELLANEOUS) ×3 IMPLANT
COVER BACK TABLE 60X90IN (DRAPES) ×3 IMPLANT
DECANTER SPIKE VIAL GLASS SM (MISCELLANEOUS) ×3 IMPLANT
DERMABOND ADVANCED (GAUZE/BANDAGES/DRESSINGS) ×2
DERMABOND ADVANCED .7 DNX12 (GAUZE/BANDAGES/DRESSINGS) ×1 IMPLANT
DEVICE DISSECT PLASMABLAD 3.0S (MISCELLANEOUS) ×1 IMPLANT
DIFFUSER DRILL AIR PNEUMATIC (MISCELLANEOUS) ×1 IMPLANT
DRAPE C-ARM 42X72 X-RAY (DRAPES) ×8 IMPLANT
DRAPE HALF SHEET 40X57 (DRAPES) IMPLANT
DRAPE LAPAROTOMY 100X72X124 (DRAPES) ×3 IMPLANT
DRAPE POUCH INSTRU U-SHP 10X18 (DRAPES) ×3 IMPLANT
DRSG OPSITE POSTOP 4X8 (GAUZE/BANDAGES/DRESSINGS) ×2 IMPLANT
DURAPREP 26ML APPLICATOR (WOUND CARE) ×3 IMPLANT
DURASEAL APPLICATOR TIP (TIP) IMPLANT
DURASEAL SPINE SEALANT 3ML (MISCELLANEOUS) IMPLANT
ELECT REM PT RETURN 9FT ADLT (ELECTROSURGICAL) ×3
ELECTRODE REM PT RTRN 9FT ADLT (ELECTROSURGICAL) ×1 IMPLANT
GAUZE SPONGE 4X4 12PLY STRL (GAUZE/BANDAGES/DRESSINGS) ×3 IMPLANT
GAUZE SPONGE 4X4 16PLY XRAY LF (GAUZE/BANDAGES/DRESSINGS) ×2 IMPLANT
GLOVE BIOGEL PI IND STRL 7.5 (GLOVE) IMPLANT
GLOVE BIOGEL PI IND STRL 8.5 (GLOVE) ×2 IMPLANT
GLOVE BIOGEL PI INDICATOR 7.5 (GLOVE) ×2
GLOVE BIOGEL PI INDICATOR 8.5 (GLOVE) ×4
GLOVE ECLIPSE 8.5 STRL (GLOVE) ×6 IMPLANT
GLOVE SS BIOGEL STRL SZ 7.5 (GLOVE) IMPLANT
GLOVE SUPERSENSE BIOGEL SZ 7.5 (GLOVE) ×2
GOWN STRL REUS W/ TWL LRG LVL3 (GOWN DISPOSABLE) IMPLANT
GOWN STRL REUS W/ TWL XL LVL3 (GOWN DISPOSABLE) IMPLANT
GOWN STRL REUS W/TWL 2XL LVL3 (GOWN DISPOSABLE) ×6 IMPLANT
GOWN STRL REUS W/TWL LRG LVL3 (GOWN DISPOSABLE) ×3
GOWN STRL REUS W/TWL XL LVL3 (GOWN DISPOSABLE)
HEMOSTAT POWDER KIT SURGIFOAM (HEMOSTASIS) ×4 IMPLANT
KIT BASIN OR (CUSTOM PROCEDURE TRAY) ×3 IMPLANT
KIT ROOM TURNOVER OR (KITS) ×3 IMPLANT
MILL MEDIUM DISP (BLADE) ×4 IMPLANT
NDL SPNL 18GX3.5 QUINCKE PK (NEEDLE) IMPLANT
NEEDLE HYPO 22GX1.5 SAFETY (NEEDLE) ×3 IMPLANT
NEEDLE SPNL 18GX3.5 QUINCKE PK (NEEDLE) IMPLANT
NS IRRIG 1000ML POUR BTL (IV SOLUTION) ×3 IMPLANT
OIL CARTRIDGE MAESTRO DRILL (MISCELLANEOUS)
PACK LAMINECTOMY NEURO (CUSTOM PROCEDURE TRAY) ×3 IMPLANT
PAD ARMBOARD 7.5X6 YLW CONV (MISCELLANEOUS) ×9 IMPLANT
PATTIES SURGICAL .5 X1 (DISPOSABLE) ×7 IMPLANT
PATTIES SURGICAL .5X1.5 (GAUZE/BANDAGES/DRESSINGS) IMPLANT
PATTIES SURGICAL 1X1 (DISPOSABLE) ×2 IMPLANT
PLASMABLADE 3.0S (MISCELLANEOUS) ×3
ROD TI CVD 5.5X65MM (Rod) ×4 IMPLANT
SCREW VITALITY PA 6.5X45MM (Screw) ×12 IMPLANT
SEALANT ADHERUS EXTEND TIP (MISCELLANEOUS) ×2 IMPLANT
SPACER ZYSTON STR 11X25X10X8 (Spacer) ×4 IMPLANT
SPACER ZYSTON STRT 12X25X10X8 (Spacer) ×4 IMPLANT
SPONGE LAP 4X18 X RAY DECT (DISPOSABLE) ×2 IMPLANT
SPONGE NEURO XRAY DETECT 1X3 (DISPOSABLE) ×2 IMPLANT
SPONGE SURGIFOAM ABS GEL 100 (HEMOSTASIS) ×3 IMPLANT
SUT PROLENE 6 0 BV (SUTURE) ×28 IMPLANT
SUT VIC AB 1 CT1 18XBRD ANBCTR (SUTURE) ×1 IMPLANT
SUT VIC AB 1 CT1 8-18 (SUTURE) ×3
SUT VIC AB 2-0 CP2 18 (SUTURE) ×3 IMPLANT
SUT VIC AB 3-0 SH 8-18 (SUTURE) ×5 IMPLANT
SYR 3ML LL SCALE MARK (SYRINGE) ×12 IMPLANT
SYR 5ML LL (SYRINGE) IMPLANT
SYR BULB 3OZ (MISCELLANEOUS) ×2 IMPLANT
TOP CLOSURE TORQ LIMIT (Neuro Prosthesis/Implant) ×12 IMPLANT
TOWEL GREEN STERILE (TOWEL DISPOSABLE) ×3 IMPLANT
TOWEL GREEN STERILE FF (TOWEL DISPOSABLE) ×3 IMPLANT
TRAY FOLEY W/METER SILVER 16FR (SET/KITS/TRAYS/PACK) ×3 IMPLANT
WATER STERILE IRR 1000ML POUR (IV SOLUTION) ×3 IMPLANT

## 2016-09-12 NOTE — Progress Notes (Signed)
Patient ID: Daniel [name], male   DOB: 1935-04-20, 81 y.o.   MRN: [id number] Patient is awake and alert Motor function is intact in lower extremities Dressing is clean dry

## 2016-09-12 NOTE — H&P (Addendum)
Daniel [name] is an 81 y.o. male.   Chief Complaint: Back and bilateral leg pain HPI: Patient is a 81 year old individual who has significant episodes of back pain and leg pain that of been getting progressively worse. He notes that there is been a substantial deterioration in his gait over the last few years. I seen him back then and suggested some conservative efforts and physical therapy and he is done these things but despite this he noted progressive worsening his ability to ambulate any distance with increasing pain and unsteadiness on his feet. He has severe stenosis at the levels of L3-4 and L4-5 and after some consideration we discussed surgical decompression and stabilization of those 2 levels. He is now admitted for that surgery  Past Medical History:  Diagnosis Date  . Alzheimer disease   . Arthritis   . Cancer (HCC)    Skin  . Diabetes mellitus without complication ([place])   . GERD (gastroesophageal reflux disease)   . Hyperlipidemia   . Hypertension     Past Surgical History:  Procedure Laterality Date  . BACK SURGERY    . CATARACT EXTRACTION Bilateral 1987 89  . ROTATOR CUFF REPAIR Left 2003    History reviewed. No pertinent family history. Social History:  reports that he quit smoking about 38 years ago. He has never used smokeless tobacco. He reports that he drinks alcohol. He reports that he does not use drugs.  Allergies:  Allergies  Allergen Reactions  . Donepezil Diarrhea    Medications Prior to Admission  Medication Sig Dispense Refill  . Alpha Lipoic Acid 200 MG CAPS Take 1 capsule by mouth daily.    Marland Kitchen atorvastatin (LIPITOR) 40 MG tablet Take 40 mg by mouth every morning.     . celecoxib (CELEBREX) 200 MG capsule Take 200 mg by mouth every morning.     . Cholecalciferol ([employer]S D3) 2000 units CAPS Take 1 capsule by mouth every morning.     . ezetimibe (ZETIA) 10 MG tablet Take 10 mg by mouth every evening.   3  . LYRICA 50 MG capsule Take 50 mg by mouth 2  (two) times daily.   3  . metFORMIN (GLUCOPHAGE) 500 MG tablet Take 500 mg by mouth 2 (two) times daily.  2  . Multiple Minerals-Vitamins (CALCIUM-MAGNESIUM-ZINC-D3) TABS Take 1 tablet by mouth every morning.     . Multiple Vitamin (MULTIVITAMIN WITH MINERALS) TABS Take 1 tablet by mouth daily.    . pantoprazole (PROTONIX) 40 MG tablet Take 40 mg by mouth every morning.     . ramipril (ALTACE) 2.5 MG capsule Take 2.5 mg by mouth every morning.     . rivastigmine (EXELON) 9.5 mg/24hr Place 1 patch (9.5 mg total) onto the skin daily. 30 patch 11    Results for orders placed or performed during the hospital encounter of 09/12/16 (from the past 48 hour(s))  Glucose, capillary     Status: Abnormal   Collection Time: 09/12/16  8:43 AM  Result Value Ref Range   Glucose-Capillary 162 (H) 65 - 99 mg/dL   No results found.  Review of Systems  HENT: Negative.   Cardiovascular: Negative.   Gastrointestinal: Negative.   Genitourinary: Negative.   Musculoskeletal: Positive for back pain.  Skin: Negative.   Neurological: Positive for tingling, sensory change and weakness.       Unsteadiness of gait  Endo/Heme/Allergies: Negative.   Psychiatric/Behavioral: Negative.     Blood pressure (!) 160/71, pulse (!) 57, temperature (!)  97.5 F (36.4 C), temperature source Oral, resp. rate 18, height 6' (1.829 m), weight 78.7 kg (173 lb 9.6 oz), SpO2 98 %. Physical Exam  Constitutional: He is oriented to person, place, and time. He appears well-developed and well-nourished.  HENT:  Head: Normocephalic and atraumatic.  Eyes: Pupils are equal, round, and reactive to light. Conjunctivae and EOM are normal.  Neck: Normal range of motion. Neck supple.  GI: Soft. Bowel sounds are normal.  Neurological: He is alert and oriented to person, place, and time.  Absent deep tendon reflexes in patella and Achilles upper extremity reflexes are intact motor strength is intact in iliopsoas quad tibialis anterior and  gastrocs patient walks with a moderately wide-based gait. Romberg's test is positive.  Skin: Skin is warm and dry.  Psychiatric: He has a normal mood and affect. His behavior is normal. Judgment and thought content normal.     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  Plan surgical decompression with stabilization L3-L5.  Earleen Newport, MD 09/12/2016, 10:27 AM

## 2016-09-12 NOTE — Transfer of Care (Signed)
Immediate Anesthesia Transfer of Care Note  Patient: Daniel [name]  Procedure(s) Performed: Procedure(s) with comments: Lumbar three-four,  Lumbar four- five Posterior lumbar interbody fusion (N/A) - L3-4 L4-5 Posterior lumbar interbody fusion  Patient Location: PACU  Anesthesia Type:General  Level of Consciousness: awake and patient cooperative  Airway & Oxygen Therapy: Patient Spontanous Breathing  Post-op Assessment: Report given to RN and Post -op Vital signs reviewed and stable  Post vital signs: Reviewed and stable  Last Vitals:  Vitals:   09/12/16 0847  BP: (!) 160/71  Pulse: (!) 57  Resp: 18  Temp: (!) 36.4 C  SpO2: 98%    Last Pain:  Vitals:   09/12/16 0847  TempSrc: Oral  PainSc:          Complications: No apparent anesthesia complications

## 2016-09-12 NOTE — Op Note (Signed)
Procedure: The patient was brought to the operating room supine on a stretcher. After the smooth induction of general endotracheal anesthesia he was turned prone. Back was prepped with alcohol and DuraPrep and draped in a sterile fashion. A vertical incision was created in the mid lumbar spine this was carried down through the lumbar dorsal region to expose the laminar arches of L3-L4 and L5. These were identified positively with radial grafts. Then inferior laminectomy at L3 was completed removing the entirety of the facet joint at L3 for a total laminectomy of L4 was completed when the lamina was lifted in a volitional the dura occurred which cause a large tear of the dura up the central portion of the canal. This required significant repair using 6-0 Prolene and multiple sutures to repair the dura over the dorsum of the spinal canal. The nerve roots were carefully protected during this period time. Of the final repair was noted to be not quite watertight and because of this some dural adhesive was used also. Once the dura was closed we proceeded with the ostial lumbar interbody arthrodesis portion removing the entirety of the disc at L3-4 and L4-5 intervertebral cages were chosen and these were noted to be is iced on peek cages 12 mm x 25 mm in length with 10 mm in width 8 lordosis at the level of L3-4 and 11 mm tall 25 long 10 wide 8 lordotic at L4-5. The cages were packed along with 6 mL of bone graft at L3-4 and then a 9 mL of bone graft at L4-5. Pedicle entry sites were then chosen and 6.5 x 45 mm Vitale pedicle screws were placed in L3-L4 and L5 and 65 mm precontoured rods were used to connect these together 10 mL of equivocal bone was used with the autograft to make in autograft allograft mixture that was used in the interbody spaces. The same mixture was then used in the lateral gutters between L3 and L5 with a been previously decorticated. Once the fixation was checked radiographically the area was  inspected for hemostasis this time dural adhesive was applied over the dural surface of the dura. Then the lumbar dorsal fascia was closed with #1 Vicryl in interrupted fashion 2-0 Vicryl was used in subcutaneous anus tissues and 3-0 Vicryl subcuticularly. Estimated blood loss was 600 mL and 200 mL of Cell Saver blood was returned to the patient patient will be taken to the ICU for further postoperative monitoring.

## 2016-09-12 NOTE — Anesthesia Procedure Notes (Addendum)
Procedure Name: Intubation Date/Time: 09/12/2016 10:59 AM Performed by: Kyung Rudd Pre-anesthesia Checklist: Patient identified, Emergency Drugs available, Suction available and Patient being monitored Patient Re-evaluated:Patient Re-evaluated prior to induction Oxygen Delivery Method: Circle System Utilized Preoxygenation: Pre-oxygenation with 100% oxygen Induction Type: IV induction Ventilation: Mask ventilation without difficulty Laryngoscope Size: Mac and 4 Grade View: Grade I Tube type: Oral Tube size: 7.5 mm Number of attempts: 1 Airway Equipment and Method: Stylet and Oral airway Placement Confirmation: ETT inserted through vocal cords under direct vision,  positive ETCO2 and breath sounds checked- equal and bilateral Secured at: 23 cm Tube secured with: Tape Dental Injury: Teeth and Oropharynx as per pre-operative assessment  Comments: ETT placed by E.Clapper, SRNA

## 2016-09-12 NOTE — Consult Note (Signed)
Name: Daniel [name] MRN: [id number] DOB: 24-Apr-1935    ADMISSION DATE:  09/12/2016 CONSULTATION DATE:  8/28  REFERRING MD :  Dr. Ellene Route  CHIEF COMPLAINT:  Elective lumbar decompression, medical management.   HISTORY OF PRESENT ILLNESS:  81 year old male with PMH as below, which is significant for DM, early alzheimer's, HTN, HLD, and skin Ca. He has a long-standing history of lower back pain and had failed methods for conservative management of this. He presented 8/28 for elective lumbar decompression L3-L5. Surgery complicated by 426ST EBL. 200 cc returned with cell-saver. Extubate din PACU. To ICU for monitoring.   SIGNIFICANT EVENTS  8/28 > admit for elective lumbar decompressoin  STUDIES:    PAST MEDICAL HISTORY :   has a past medical history of Alzheimer disease; Arthritis; Cancer ([place]); Diabetes mellitus without complication ([place]); GERD (gastroesophageal reflux disease); Hyperlipidemia; and Hypertension.  has a past surgical history that includes Rotator cuff repair (Left, 2003); Cataract extraction (Bilateral, 1987 89); and Back surgery. Prior to Admission medications   Medication Sig Start Date End Date Taking? Authorizing Provider  Alpha Lipoic Acid 200 MG CAPS Take 1 capsule by mouth daily.   Yes [provider]  atorvastatin (LIPITOR) 40 MG tablet Take 40 mg by mouth every morning.    Yes [provider]  celecoxib (CELEBREX) 200 MG capsule Take 200 mg by mouth every morning.    Yes [provider]  Cholecalciferol (CVS D3) 2000 units CAPS Take 1 capsule by mouth every morning.    Yes [provider]  ezetimibe (ZETIA) 10 MG tablet Take 10 mg by mouth every evening.  05/09/15  Yes [provider]  LYRICA 50 MG capsule Take 50 mg by mouth 2 (two) times daily.  06/08/15  Yes [provider]  metFORMIN (GLUCOPHAGE) 500 MG tablet Take 500 mg by mouth 2 (two) times daily. 04/05/15  Yes [provider]  Multiple  Minerals-Vitamins (CALCIUM-MAGNESIUM-ZINC-D3) TABS Take 1 tablet by mouth every morning.    Yes [provider]  Multiple Vitamin (MULTIVITAMIN WITH MINERALS) TABS Take 1 tablet by mouth daily.   Yes [provider]  pantoprazole (PROTONIX) 40 MG tablet Take 40 mg by mouth every morning.    Yes [provider]  ramipril (ALTACE) 2.5 MG capsule Take 2.5 mg by mouth every morning.    Yes [provider]  rivastigmine (EXELON) 9.5 mg/24hr Place 1 patch (9.5 mg total) onto the skin daily. 06/08/16  Yes Sater, Nanine Means, MD   Allergies  Allergen Reactions  . Donepezil Diarrhea    FAMILY HISTORY:  family history is not on file. SOCIAL HISTORY:  reports that he quit smoking about 38 years ago. He has never used smokeless tobacco. He reports that he drinks alcohol. He reports that he does not use drugs.  REVIEW OF SYSTEMS:     Bolds are positive  Constitutional: weight loss, gain, night sweats, Fevers, chills, fatigue .  HEENT: headaches, Sore throat, sneezing, nasal congestion, post nasal drip, Difficulty swallowing, Tooth/dental problems, visual complaints visual changes, ear ache CV:  chest pain, radiates:,Orthopnea, PND, swelling in lower extremities, dizziness, palpitations, syncope.  GI  heartburn, indigestion, abdominal pain, nausea, vomiting, diarrhea, change in bowel habits, loss of appetite, bloody stools.  Resp: cough, productive: , hemoptysis, dyspnea, chest pain, pleuritic.  Skin: rash or itching or icterus GU: dysuria, change in color of urine, urgency or frequency. flank pain, hematuria  MS: back pain or swelling. decreased range of motion  Psych: change in mood or affect. depression or anxiety.  Neuro: difficulty with speech, weakness, numbness, ataxia    SUBJECTIVE:   VITAL SIGNS: Temp:  [97.5 F (36.4 C)-97.9 F (36.6 C)] 97.9 F (36.6 C) (08/28 1933) Pulse Rate:  [57-73] 73 (08/28 2000) Resp:  [12-21] 21 (08/28 2000) BP:  (142-160)/(67-79) 156/79 (08/28 2000) SpO2:  [88 %-98 %] 98 % (08/28 2000) Weight:  [78.7 kg (173 lb 9.6 oz)] 78.7 kg (173 lb 9.6 oz) (08/28 0847)  PHYSICAL EXAMINATION: General:  Thin elderly male in NAD Neuro:  Alert, oriented, non-focal. Sensation intact bilateral feet.  HEENT:  /AT, PERRL, no JVD Cardiovascular:  RRR, no MRG Lungs:  clear Abdomen:  Soft, non-tender, non-distened. BS normoactive Musculoskeletal:  Moves all 4 extremities Skin:  Grossly intact, surgical wound lower back not assessed.    Recent Labs Lab 09/06/16 1555  NA 136  K 4.2  CL 102  CO2 26  BUN 19  CREATININE 0.98  GLUCOSE 191*    Recent Labs Lab 09/06/16 1555  HGB 14.0  HCT 40.9  WBC 7.1  PLT 200   Dg Lumbar Spine 2-3 Views  Result Date: 09/12/2016 CLINICAL DATA:  Posterior fusion at L3-4, L4-5 EXAM: LUMBAR SPINE - 2-3 VIEW; DG C-ARM 61-120 MIN COMPARISON:  None. FINDINGS: Changes of posterior fusion noted from L3-L5. Normal alignment. No hardware or bony complicating feature. IMPRESSION: L3-L5 posterior fusion.  No complicating feature. Electronically Signed   By: Rolm Baptise M.D.   On: 09/12/2016 20:01   Dg Lumbar Spine 2-3 Views  Result Date: 09/12/2016 CLINICAL DATA:  Localization films for L3-L5 PLIf EXAM: LUMBAR SPINE - 2-3 VIEW COMPARISON:  MR 09/08/2015 FINDINGS: Numbering scheme follows that used on prior study. This series of lateral intraoperative images demonstrates surgical instruments initially overlying the L2 and L3 spinous processes, with placement of a metallic probe tip overlying the L3-4 interspace on the 3rd image. IMPRESSION: 1. Intraoperative localization. Electronically Signed   By: Lucrezia Europe M.D.   On: 09/12/2016 13:21   Dg C-arm 1-60 Min  Result Date: 09/12/2016 CLINICAL DATA:  Posterior fusion at L3-4, L4-5 EXAM: LUMBAR SPINE - 2-3 VIEW; DG C-ARM 61-120 MIN COMPARISON:  None. FINDINGS: Changes of posterior fusion noted from L3-L5. Normal alignment. No hardware or  bony complicating feature. IMPRESSION: L3-L5 posterior fusion.  No complicating feature. Electronically Signed   By: Rolm Baptise M.D.   On: 09/12/2016 20:01    ASSESSMENT / PLAN:  Back pain s/p elective lumbar decompression 8/28 - Management per neurosurgery. - Frequent neurovascular checks.   - Pain management - Repeat CBC in AM - ICU monitoring overnight  DM - Continue metformin - CBG monitoring ACHS and SSI.   HTN - Continue home ramipril  HLD - Conitnue home atorvastatin, zetia  GERD - continue home protonix  Mild Alzheimer's - monitor  Dispo: ICU Diet: NPO, RN to advance as tolerated SUP: PO Protonix VTE ppx: SCDs  Georgann Housekeeper, AGACNP-BC [facility] Pulmonology/Critical Care Pager [phone] or [phone]  09/12/2016 9:00 PM

## 2016-09-12 NOTE — Progress Notes (Signed)
Received request from PCCM to see if patient could transfer out of ICU as there is a high necessity for ICU beds at this time. Eddie Dibbles, NP with PCCM has evaluated patient and cleared him to be moved out of ICU if its ok with neurosurgery. PCCM stated they would follow the patient even if they get transferred to a SDU or MS bed. Called Neurosurgery and spoke with [place], [place] who stated it was ok for patient to be moved to M/S-  11M/5C. Order placed.

## 2016-09-12 NOTE — Anesthesia Preprocedure Evaluation (Addendum)
Anesthesia Evaluation  Patient identified by MRN, date of birth, ID band Patient awake    Reviewed: Allergy & Precautions, NPO status , Patient's Chart, lab work & pertinent test results  Airway Mallampati: II  TM Distance: >3 FB Neck ROM: Full    Dental no notable dental hx. (+) Teeth Intact   Pulmonary neg pulmonary ROS, former smoker,    Pulmonary exam normal breath sounds clear to auscultation       Cardiovascular hypertension, negative cardio ROS Normal cardiovascular exam Rhythm:Regular Rate:Normal     Neuro/Psych PSYCHIATRIC DISORDERS negative neurological ROS  negative psych ROS   GI/Hepatic negative GI ROS, Neg liver ROS, GERD  ,  Endo/Other  negative endocrine ROSdiabetes  Renal/GU negative Renal ROS  negative genitourinary   Musculoskeletal negative musculoskeletal ROS (+) Arthritis , Osteoarthritis,    Abdominal   Peds negative pediatric ROS (+)  Hematology negative hematology ROS (+)   Anesthesia Other Findings   Reproductive/Obstetrics negative OB ROS                            Anesthesia Physical Anesthesia Plan  ASA: III  Anesthesia Plan: General   Post-op Pain Management:    Induction: Intravenous  PONV Risk Score and Plan: 2 and Ondansetron, Dexamethasone and Treatment may vary due to age or medical condition  Airway Management Planned: Oral ETT  Additional Equipment:   Intra-op Plan:   Post-operative Plan: Extubation in OR  Informed Consent:   Plan Discussed with:   Anesthesia Plan Comments: (  )       Anesthesia Quick Evaluation

## 2016-09-13 DIAGNOSIS — I10 Essential (primary) hypertension: Secondary | ICD-10-CM

## 2016-09-13 DIAGNOSIS — E1142 Type 2 diabetes mellitus with diabetic polyneuropathy: Secondary | ICD-10-CM

## 2016-09-13 LAB — BASIC METABOLIC PANEL
Anion gap: 10 (ref 5–15)
BUN: 16 mg/dL (ref 6–20)
CALCIUM: 8.6 mg/dL — AB (ref 8.9–10.3)
CHLORIDE: 106 mmol/L (ref 101–111)
CO2: 25 mmol/L (ref 22–32)
CREATININE: 1.07 mg/dL (ref 0.61–1.24)
GFR calc Af Amer: >60 mL/min (ref >60)
Glucose, Bld: 228 mg/dL — ABNORMAL HIGH (ref 65–99)
Potassium: 3.9 mmol/L (ref 3.5–5.1)
SODIUM: 141 mmol/L (ref 135–145)

## 2016-09-13 LAB — GLUCOSE, CAPILLARY
GLUCOSE-CAPILLARY: 202 mg/dL — AB (ref 65–99)
GLUCOSE-CAPILLARY: 195 mg/dL — AB (ref 65–99)
Glucose-Capillary: 172 mg/dL — ABNORMAL HIGH (ref 65–99)
Glucose-Capillary: 180 mg/dL — ABNORMAL HIGH (ref 65–99)
Glucose-Capillary: 202 mg/dL — ABNORMAL HIGH (ref 65–99)
Glucose-Capillary: 251 mg/dL — ABNORMAL HIGH (ref 65–99)
Glucose-Capillary: 278 mg/dL — ABNORMAL HIGH (ref 65–99)

## 2016-09-13 LAB — CBC
HCT: 33.5 % — ABNORMAL LOW (ref 39.0–52.0)
Hemoglobin: 11 g/dL — ABNORMAL LOW (ref 13.0–17.0)
MCH: 30.9 pg (ref 26.0–34.0)
MCHC: 32.8 g/dL (ref 30.0–36.0)
MCV: 94.1 fL (ref 78.0–100.0)
PLATELETS: 155 10*3/uL (ref 150–400)
RBC: 3.56 MIL/uL — ABNORMAL LOW (ref 4.22–5.81)
RDW: 12.8 % (ref 11.5–15.5)
WBC: 10.8 10*3/uL — ABNORMAL HIGH (ref 4.0–10.5)

## 2016-09-13 MED ORDER — WHITE PETROLATUM GEL
Status: AC
  Administered 2016-09-13: 1
  Filled 2016-09-13: qty 1

## 2016-09-13 MED ORDER — HALOPERIDOL LACTATE 5 MG/ML IJ SOLN
2.0000 mg | Freq: Four times a day (QID) | INTRAMUSCULAR | Status: DC | PRN
  Administered 2016-09-14: 2 mg via INTRAVENOUS
  Filled 2016-09-13: qty 1

## 2016-09-13 MED FILL — Gelatin Absorbable MT Powder: OROMUCOSAL | Qty: 1 | Status: AC

## 2016-09-13 MED FILL — Heparin Sodium (Porcine) Inj 1000 Unit/ML: INTRAMUSCULAR | Qty: 30 | Status: AC

## 2016-09-13 MED FILL — Thrombin For Soln 5000 Unit: CUTANEOUS | Qty: 5000 | Status: AC

## 2016-09-13 MED FILL — Sodium Chloride IV Soln 0.9%: INTRAVENOUS | Qty: 2000 | Status: AC

## 2016-09-13 NOTE — Progress Notes (Signed)
   Name: Daniel [name] MRN: [id number] DOB: [birth year]    ADMISSION DATE:  09/12/2016 CONSULTATION DATE:  8/28  REFERRING MD :  Dr. Ellene Route  CHIEF COMPLAINT:  Elective lumbar decompression, medical management.   HISTORY OF PRESENT ILLNESS:   81 yo male with low back pain that failed conservative management.  He had L3-L5 lumbar decompression with EBL 600 ml, and extubated in PACU.  PCCM asked to assist with medical management in ICU.  He has PMHx of DM, early dementia, HTN, HLD, skin cancer, GERD.  SUBJECTIVE:  Mild back pain.  No leg pain.  Had several fascinating stories about the history of [place].  VITAL SIGNS: Temp:  [97.5 F (36.4 C)-99.8 F (37.7 C)] 99.8 F (37.7 C) (08/29 0400) Pulse Rate:  [57-75] 64 (08/29 0800) Resp:  [12-25] 23 (08/29 0800) BP: (128-160)/(67-84) 128/68 (08/29 0800) SpO2:  [88 %-100 %] 98 % (08/29 0800) Weight:  [173 lb 9.6 oz (78.7 kg)] 173 lb 9.6 oz (78.7 kg) (08/28 0847)  PHYSICAL EXAMINATION:  General - pleasant Eyes - pupils reactive ENT - no sinus tenderness, no oral exudate, no LAN Cardiac - regular, no murmur Chest - no wheeze, rales Abd - soft, non tender Ext - no edema Skin - no rashes Neuro - normal strength Psych - normal mood    CMP Latest Ref Rng & Units 09/13/2016 09/06/2016 01/01/2012  Glucose 65 - 99 mg/dL 228(H) 191(H) 169(H)  BUN 6 - 20 mg/dL 16 19 19   Creatinine 0.61 - 1.24 mg/dL 1.07 0.98 0.72  Sodium 135 - 145 mmol/L 141 136 137  Potassium 3.5 - 5.1 mmol/L 3.9 4.2 4.0  Chloride 101 - 111 mmol/L 106 102 98  CO2 22 - 32 mmol/L 25 26 26   Calcium 8.9 - 10.3 mg/dL 8.6(L) 9.9 9.4  Total Protein 6.0 - 8.3 g/dL - - 7.2  Total Bilirubin 0.3 - 1.2 mg/dL - - 0.6  Alkaline Phos 39 - 117 U/L - - 71  AST 0 - 37 U/L - - 71(H)  ALT 0 - 53 U/L - - 83(H)    CBC Latest Ref Rng & Units 09/13/2016 09/06/2016 05/05/2014  WBC 4.0 - 10.5 K/uL 10.8(H) 7.1 5.4  Hemoglobin 13.0 - 17.0 g/dL 11.0(L) 14.0 14.2  Hematocrit 39.0 - 52.0 %  33.5(L) 40.9 41.1  Platelets 150 - 400 K/uL 155 200 165    CBG (last 3)   Recent Labs  09/12/16 2026 09/13/16 0006 09/13/16 0356  GLUCAP 202* 202* 195*    ASSESSMENT / PLAN:  Back pain s/p elective lumbar decompression 8/28 - post op care per neurosurgery  Acute blood loss anemia after surgery. - Hb stable - f/u CBC  DM with peripheral neuropathy. - metformin with SSI - lyrica  HTN - ramipril  HLD - lipitor, zetia  GERD - continue protonix  Mild Alzheimer's - exelon patch  DVT prophylaxis - SCDs Nutrition - clear liquids Goals of care - full code  PCCM can be available as needed while he is in ICU.  Please call if further help needed.  Chesley Mires, MD [facility] Pulmonary/Critical Care 09/13/2016, 8:35 AM Pager:  [phone] After 3pm call: [phone]

## 2016-09-13 NOTE — Plan of Care (Signed)
Problem: Pain Management: Goal: Pain level will decrease Outcome: Progressing Discussed with patient pain medication options, importance of taking medication before pain is out of control.   Problem: Activity: Goal: Risk for activity intolerance will decrease Outcome: Progressing Discussed with patient importance of mobility after surgery, patient up to [facility] with two assist.

## 2016-09-13 NOTE — Progress Notes (Signed)
Patient arrived from 4N ICU by bed via staff.  Vitals stable. Patient oriented to room/unit. Questions answered. Continue to monitor patient.

## 2016-09-13 NOTE — Progress Notes (Signed)
OT Cancellation Note  Patient Details Name: Daniel [name] MRN: [id number] DOB: [birth year]   Cancelled Treatment:    Reason Eval/Treat Not Completed: Patient not medically ready. Pt with active bedrest orders. Will await increase in activity orders prior to initiating OT evaluation. Thank you for this referral!  Norman Herrlich, MS OTR/L  Pager: [place] 09/13/2016, 7:06 AM

## 2016-09-13 NOTE — Progress Notes (Signed)
PT Cancellation Note  Patient Details Name: Daniel [name] MRN: [id number] DOB: 15-Apr-1935   Cancelled Treatment:    Reason Eval/Treat Not Completed: Patient not medically ready.  Pt currently on bedrest. Will await increased activity orders prior to initiating PT eval.    Thelma Comp 09/13/2016, 7:20 AM   [name], PT, DPT Acute Rehabilitation Services Pager: [phone]

## 2016-09-14 LAB — CBC
HCT: 29.9 % — ABNORMAL LOW (ref 39.0–52.0)
HEMOGLOBIN: 10.2 g/dL — AB (ref 13.0–17.0)
MCH: 32.1 pg (ref 26.0–34.0)
MCHC: 34.1 g/dL (ref 30.0–36.0)
MCV: 94 fL (ref 78.0–100.0)
Platelets: 134 10*3/uL — ABNORMAL LOW (ref 150–400)
RBC: 3.18 MIL/uL — AB (ref 4.22–5.81)
RDW: 13.2 % (ref 11.5–15.5)
WBC: 15.1 10*3/uL — AB (ref 4.0–10.5)

## 2016-09-14 LAB — GLUCOSE, CAPILLARY
GLUCOSE-CAPILLARY: 217 mg/dL — AB (ref 65–99)
GLUCOSE-CAPILLARY: 196 mg/dL — AB (ref 65–99)
GLUCOSE-CAPILLARY: 251 mg/dL — AB (ref 65–99)
Glucose-Capillary: 172 mg/dL — ABNORMAL HIGH (ref 65–99)

## 2016-09-14 LAB — BASIC METABOLIC PANEL
ANION GAP: 8 (ref 5–15)
BUN: 17 mg/dL (ref 6–20)
CALCIUM: 8.4 mg/dL — AB (ref 8.9–10.3)
CO2: 26 mmol/L (ref 22–32)
Chloride: 105 mmol/L (ref 101–111)
Creatinine, Ser: 1.09 mg/dL (ref 0.61–1.24)
Glucose, Bld: 179 mg/dL — ABNORMAL HIGH (ref 65–99)
POTASSIUM: 3.7 mmol/L (ref 3.5–5.1)
Sodium: 139 mmol/L (ref 135–145)

## 2016-09-14 MED ORDER — DEXAMETHASONE 4 MG PO TABS
4.0000 mg | ORAL_TABLET | Freq: Two times a day (BID) | ORAL | Status: DC
  Administered 2016-09-14 – 2016-09-15 (×3): 4 mg via ORAL
  Filled 2016-09-14 (×3): qty 1

## 2016-09-14 NOTE — Progress Notes (Signed)
Vital signs are stable Patient describes a fair amount of pain mostly centralized in his back Incision and dressing appear to be clean and dry Motor function is good the patient's ability at effort seems to the be less than expected We'll add low-dose Decadron to help with inflammation PT and OT to evaluate and mobilize today

## 2016-09-14 NOTE — Progress Notes (Signed)
Patient participated with PT/OT, pain managed with oral pain meds, Patient out of bed for an hour, used bedside commode as needed. Wife is at his bedside this evening. Will continue to monitor

## 2016-09-14 NOTE — Evaluation (Signed)
Occupational Therapy Evaluation Patient Details Name: Daniel [name] MRN: [id number] DOB: 1935-06-29 Today's Date: 09/14/2016    History of Present Illness 81 y.o. male admitted on 09/12/16 for lumbar decompression and fusion.  Pt with significant PMH of HTN, DM, Alzheimer's dementia, and L RTC repair.   Clinical Impression   Pt admitted with the above diagnoses and presents with below problem list. Pt will benefit from continued acute OT to address the below listed deficits and maximize independence with basic ADLs prior to d/c to next venue. PTA pt reports he was independent with ADLs. Pt is currently +2 min -mod A with LB ADLs, bed mobility, and functional transfers. No family present during session.      Follow Up Recommendations  SNF    Equipment Recommendations  Other (comment) (defer to next venue)    Recommendations for Other Services       Precautions / Restrictions Precautions Precautions: Back Precaution Comments: verbally reviewed Required Braces or Orthoses: Spinal Brace Spinal Brace: Lumbar corset;Applied in sitting position Restrictions Weight Bearing Restrictions: No      Mobility Bed Mobility Overal bed mobility: Needs Assistance Bed Mobility: Rolling;Sidelying to Sit Rolling: Min assist Sidelying to sit: Mod assist;+2 for physical assistance       General bed mobility comments: Min assist to roll right simulating home direction using rails and facilitation for log roll technique.  Transfers Overall transfer level: Needs assistance Equipment used: Rolling walker (2 wheeled) Transfers: Sit to/from [facility] Sit to Stand: +2 physical assistance;Mod assist;From elevated surface Stand pivot transfers: +2 safety/equipment;Min assist       General transfer comment: Mod assist from elelvated bed to support trunk and stabilize RW to get to standing over flexed knees and flexed trunk.  Extra time for transition.  Two person min assist for  safety to take some pivotal steps with RW around to chair.      Balance Overall balance assessment: Needs assistance Sitting-balance support: Feet supported;Bilateral upper extremity supported Sitting balance-Leahy Scale: Fair     Standing balance support: Bilateral upper extremity supported Standing balance-Leahy Scale: Poor Standing balance comment: needs external support from RW and assist from therapists                           ADL either performed or assessed with clinical judgement   ADL Overall ADL's : Needs assistance/impaired Eating/Feeding: Set up;Sitting   Grooming: Set up;Sitting   Upper Body Bathing: Minimal assistance;Sitting   Lower Body Bathing: Moderate assistance;+2 for physical assistance;Sit to/from stand   Upper Body Dressing : Minimal assistance;Sitting   Lower Body Dressing: Moderate assistance;+2 for physical assistance;Sit to/from stand   Toilet Transfer: Minimal assistance;+2 for physical assistance;Stand-pivot;BSC;RW Toilet Transfer Details (indicate cue type and reason): simulated with EOB>recliner Toileting- Clothing Manipulation and Hygiene: Moderate assistance;+2 for physical assistance   Tub/ Shower Transfer: Walk-in shower;+2 for physical assistance;Minimal assistance;Stand-pivot;3 in 1;Rolling walker     General ADL Comments: Bed mobility and pivotal steps to recliner. Lots of encouragement and takes his time. Cued for not twisting during bed mobility.     Vision         Perception     Praxis      Pertinent Vitals/Pain Pain Assessment: 0-10 Pain Score: 7  Pain Location: incisional Pain Descriptors / Indicators: Grimacing;Guarding Pain Intervention(s): Limited activity within patient's tolerance;Monitored during session;Repositioned     Hand Dominance     Extremity/Trunk Assessment Upper Extremity Assessment Upper  Extremity Assessment: Generalized weakness   Lower Extremity Assessment Lower Extremity  Assessment: Defer to PT evaluation   Cervical / Trunk Assessment Cervical / Trunk Assessment: Other exceptions Cervical / Trunk Exceptions: post op lumbar surgery   Communication Communication Communication: No difficulties   Cognition Arousal/Alertness: Awake/alert Behavior During Therapy: WFL for tasks assessed/performed Overall Cognitive Status: History of cognitive impairments - at baseline                                 General Comments: no family present to confirm baseline   General Comments       Exercises     Shoulder Instructions      Home Living Family/patient expects to be discharged to:: Private residence Living Arrangements: Children;Spouse/significant other (son who is disabled) Available Help at Discharge: Family Type of Home: House Home Access: Stairs to enter Technical brewer of Steps: 2 Entrance Stairs-Rails: Right Home Layout: Multi-level Alternate Level Stairs-Number of Steps: 8 Alternate Level Stairs-Rails: Left Bathroom Shower/Tub: Tub/shower unit;Walk-in shower   Bathroom Toilet: Handicapped height     Home Equipment: Shower seat - built in;Grab bars - tub/shower   Additional Comments: no family available to confirm, pt reports all os the equipment       Prior Functioning/Environment Level of Independence: Needs assistance  Gait / Transfers Assistance Needed: did not use an AD before ADL's / Homemaking Assistance Needed: independent Communication / Swallowing Assistance Needed: intact          OT Problem List: Impaired balance (sitting and/or standing);Decreased knowledge of use of DME or AE;Decreased knowledge of precautions;Pain      OT Treatment/Interventions: Self-care/ADL training;DME and/or AE instruction;Therapeutic activities;Patient/family education;Balance training    OT Goals(Current goals can be found in the care plan section) Acute Rehab OT Goals Patient Stated Goal: to decrease pain OT Goal  Formulation: With patient Time For Goal Achievement: 09/21/16 Potential to Achieve Goals: Good ADL Goals Pt Will Perform Grooming: with min guard assist;standing;sitting Pt Will Perform Upper Body Bathing: with modified independence;sitting;with caregiver independent in assisting Pt Will Perform Lower Body Bathing: with modified independence;with caregiver independent in assisting;sit to/from stand;with adaptive equipment Pt Will Perform Upper Body Dressing: with modified independence;sitting;with caregiver independent in assisting Pt Will Perform Lower Body Dressing: with modified independence;with caregiver independent in assisting;with adaptive equipment;sit to/from stand Pt Will Transfer to Toilet: with min assist;ambulating;bedside commode Pt Will Perform Toileting - Clothing Manipulation and hygiene: with min assist;sit to/from stand;with caregiver independent in assisting Pt Will Perform Tub/Shower Transfer: with min assist;ambulating;shower seat;rolling walker;3 in 1 Additional ADL Goal #1: Caregiver will be independent in ADL strategies to maintain BLT precautions.  Additional ADL Goal #2: Pt will be min A with bed mobility to prepare for OOB ADLs.   OT Frequency: Min 2X/week   Barriers to D/C: Decreased caregiver support  Pt is currently +2 assist.        Co-evaluation PT/OT/SLP Co-Evaluation/Treatment: Yes Reason for Co-Treatment: Complexity of the patient's impairments (multi-system involvement);Necessary to address cognition/behavior during functional activity;For patient/therapist safety;To address functional/ADL transfers PT goals addressed during session: Mobility/safety with mobility;Balance;Proper use of DME;Strengthening/ROM OT goals addressed during session: ADL's and self-care      AM-PAC PT "6 Clicks" Daily Activity     Outcome Measure Help from another person eating meals?: None Help from another person taking care of personal grooming?: None Help from another  person toileting, which includes using toliet, bedpan, or urinal?:  A Lot Help from another person bathing (including washing, rinsing, drying)?: A Lot Help from another person to put on and taking off regular upper body clothing?: A Little Help from another person to put on and taking off regular lower body clothing?: A Lot 6 Click Score: 17   End of Session Equipment Utilized During Treatment: Rolling walker;Back brace Nurse Communication: Mobility status  Activity Tolerance: Patient tolerated treatment well;Patient limited by pain Patient left: in chair;with call bell/phone within reach;with chair alarm set  OT Visit Diagnosis: Unsteadiness on feet (R26.81);Muscle weakness (generalized) (M62.81);Other symptoms and signs involving cognitive function;Pain                Time: 1026-1110 OT Time Calculation (min): 44 min Charges:  OT General Charges $OT Visit: 1 Visit OT Evaluation $OT Eval Moderate Complexity: 1 Mod OT Treatments $Self Care/Home Management : 8-22 mins G-Codes:       Hortencia Pilar 09/14/2016, 12:34 PM

## 2016-09-14 NOTE — Progress Notes (Addendum)
Inpatient Diabetes Program Recommendations  AACE/ADA: New Consensus Statement on Inpatient Glycemic Control (2015)  Target Ranges:  Prepandial:   less than 140 mg/dL      Peak postprandial:   less than 180 mg/dL (1-2 hours)      Critically ill patients:  140 - 180 mg/dL   Lab Results  Component Value Date   GLUCAP 217 (H) 09/14/2016   HGBA1C 6.9 (H) 09/06/2016    Review of Glycemic ControlResults for WALFRED, BETTENDORF (MRN [id number]) as of 09/14/2016 11:36  Ref. Range 09/13/2016 11:59 09/13/2016 16:31 09/13/2016 21:35 09/14/2016 06:21 09/14/2016 11:14  Glucose-Capillary Latest Ref Range: 65 - 99 mg/dL 278 (H) 172 (H) 251 (H) 172 (H) 217 (H)   Diabetes history: Type 2 diabetes Outpatient Diabetes medications: Metformin 500 mg bid Current orders for Inpatient glycemic control:  Novolog moderate tid with meals Decadron 4 mg bid, Metformin 500 mg bid  Inpatient Diabetes Program Recommendations:   While on decadron, please consider adding Novolog meal coverage 4 units tid with meals (hold if patient eats less than 50%).   Thanks, Adah Perl, RN, BC-ADM Inpatient Diabetes Coordinator Pager [phone] (8a-5p)

## 2016-09-14 NOTE — Clinical Social Work Note (Signed)
Clinical Social Work Assessment  Patient Details  Name: Daniel [name] MRN: [id number] Date of Birth: 05/02/1935  Date of referral:  09/14/16               Reason for consult:  Facility Placement, Discharge Planning                Permission sought to share information with:  Facility Sport and exercise psychologist, Family Supports Permission granted to share information::  Yes, Verbal Permission Granted  Name::     Agricultural consultant::  SNF  Relationship::  Wife  Contact Information:     Housing/Transportation Living arrangements for the past 2 months:  Single Family Home Source of Information:  Patient, Spouse Patient Interpreter Needed:  None Criminal Activity/Legal Involvement Pertinent to Current Situation/Hospitalization:  No - Comment as needed Significant Relationships:  Adult Children, Spouse Lives with:  Self, Spouse Do you feel safe going back to the place where you live?  Yes Need for family participation in patient care:  Yes (Comment) (patient is early stage Alzheimers)  Care giving concerns:  Patient has been living at home with spouse, but currently requires increased level of care and short term rehab prior to returning home.    Social Worker assessment / plan:  CSW met with patient to discuss discharge planning. CSW explained recommendation for SNF, and patient requested that CSW contact patient's wife to discuss, as she helps him make decisions. Patient's wife indicated preference for [facility]. CSW faxed out referral, and will follow up to determine bed availability. CSW to facilitate discharge when medically ready.  Employment status:  Retired Nurse, adult PT Recommendations:  [place] / Referral to community resources:  [place]  Patient/Family's Response to care:  Patient and family agreeable to SNF placement.  Patient/Family's Understanding of and Emotional Response to Diagnosis, Current Treatment,  and Prognosis:  Patient and family aware of patient's increased needs at this time. Patient's wife discussed request that all information be sent through her, as patient has early stage Alzheimer's and doesn't remember what anyone tells him. Patient and family appreciative of CSW assistance in coordinating discharge.   Emotional Assessment Appearance:  Appears stated age Attitude/Demeanor/Rapport:    Affect (typically observed):  Appropriate Orientation:  Oriented to Self, Oriented to Place, Oriented to Situation Alcohol / Substance use:  Not Applicable Psych involvement (Current and /or in the community):  No (Comment)  Discharge Needs  Concerns to be addressed:  Care Coordination Readmission within the last 30 days:  No Current discharge risk:  Physical Impairment, Cognitively Impaired Barriers to Discharge:  Continued Medical Work up, Ship broker, Programmer, applications ([place])   Geralynn Ochs, LCSW 09/14/2016, 4:13 PM

## 2016-09-14 NOTE — Evaluation (Signed)
Physical Therapy Evaluation Patient Details Name: Daniel [name] MRN: [id number] DOB: 06-Dec-1935 Today's Date: 09/14/2016   History of Present Illness  81 y.o. male admitted on 09/12/16 for lumbar decompression and fusion.  Pt with significant PMH of HTN, DM, Alzheimer's dementia, and L RTC repair.  Clinical Impression  Pt limited by pain to only OOB to recliner today with two person assist.  He would benefit from SNF level rehab prior to returning home with his wife's assist.   PT to follow acutely for deficits listed below.       Follow Up Recommendations SNF    Equipment Recommendations  Rolling walker with 5" wheels;3in1 (PT)    Recommendations for Other Services   NA    Precautions / Restrictions Precautions Precautions: Back Precaution Comments: verbally reviewed      Mobility  Bed Mobility Overal bed mobility: Needs Assistance Bed Mobility: Rolling;Sidelying to Sit Rolling: Min assist Sidelying to sit: Mod assist;+2 for physical assistance       General bed mobility comments: Min assist to roll right simulating home direction using rails and facilitation for log roll technique.  Transfers Overall transfer level: Needs assistance Equipment used: Rolling walker (2 wheeled) Transfers: Sit to/from [facility] Sit to Stand: +2 physical assistance;Mod assist;From elevated surface Stand pivot transfers: +2 safety/equipment;Min assist       General transfer comment: Mod assist from elelvated bed to support trunk and stabilize RW to get to standing over flexed knees and flexed trunk.  Extra time for transition.  Two person min assist for safety to take some pivotal steps with RW around to chair.    Ambulation/Gait             General Gait Details: Unable today, but will likely need two person chair to follow to safely progress.          Balance Overall balance assessment: Needs assistance Sitting-balance support: Feet supported;Bilateral  upper extremity supported Sitting balance-Leahy Scale: Fair     Standing balance support: Bilateral upper extremity supported Standing balance-Leahy Scale: Poor Standing balance comment: needs external support from RW and assist from therapists                             Pertinent Vitals/Pain Pain Assessment: 0-10 Pain Score: 7  Pain Location: incisional Pain Descriptors / Indicators: Grimacing;Guarding Pain Intervention(s): Limited activity within patient's tolerance;Monitored during session;Repositioned    Home Living Family/patient expects to be discharged to:: Private residence Living Arrangements: Children;Spouse/significant other (son who is disabled) Available Help at Discharge: Family Type of Home: House Home Access: Stairs to enter Entrance Stairs-Rails: Right Entrance Stairs-Number of Steps: 2 Home Layout: Multi-level Home Equipment: Shower seat - built in;Grab bars - tub/shower (unsure of available equipment) Additional Comments: no family available to confirm, pt reports all os the equipment     Prior Function Level of Independence: Needs assistance   Gait / Transfers Assistance Needed: did not use an AD before  ADL's / Homemaking Assistance Needed: independent           Extremity/Trunk Assessment   Upper Extremity Assessment Upper Extremity Assessment: Defer to OT evaluation    Lower Extremity Assessment Lower Extremity Assessment: Generalized weakness    Cervical / Trunk Assessment Cervical / Trunk Assessment: Other exceptions Cervical / Trunk Exceptions: post op lumbar surgery  Communication   Communication: No difficulties  Cognition Arousal/Alertness: Awake/alert Behavior During Therapy: WFL for tasks assessed/performed Overall Cognitive  Status: History of cognitive impairments - at baseline                                 General Comments: no family present to confirm baseline             Assessment/Plan     PT Assessment Patient needs continued PT services  PT Problem List Decreased strength;Decreased activity tolerance;Decreased balance;Decreased mobility;Decreased knowledge of use of DME;Decreased safety awareness;Decreased knowledge of precautions;Pain       PT Treatment Interventions DME instruction;Gait training;Stair training;Functional mobility training;Therapeutic activities;Therapeutic exercise;Balance training;Neuromuscular re-education;Patient/family education    PT Goals (Current goals can be found in the Care Plan section)  Acute Rehab PT Goals Patient Stated Goal: to decrease pain PT Goal Formulation: With patient Time For Goal Achievement: 09/21/16 Potential to Achieve Goals: Good    Frequency Min 5X/week   Barriers to discharge Decreased caregiver support;Inaccessible home environment multi story home and elderly wife, son is disabled and sounds like he cannot provide phycial assist.     Co-evaluation PT/OT/SLP Co-Evaluation/Treatment: Yes Reason for Co-Treatment: Complexity of the patient's impairments (multi-system involvement);Necessary to address cognition/behavior during functional activity;For patient/therapist safety;To address functional/ADL transfers PT goals addressed during session: Mobility/safety with mobility;Balance;Proper use of DME;Strengthening/ROM         AM-PAC PT "6 Clicks" Daily Activity  Outcome Measure Difficulty turning over in bed (including adjusting bedclothes, sheets and blankets)?: Unable Difficulty moving from lying on back to sitting on the side of the bed? : Unable Difficulty sitting down on and standing up from a chair with arms (e.g., wheelchair, bedside commode, etc,.)?: Unable Help needed moving to and from a bed to chair (including a wheelchair)?: A Lot Help needed walking in hospital room?: A Lot Help needed climbing 3-5 steps with a railing? : Total 6 Click Score: 8    End of Session Equipment Utilized During Treatment:  Back brace Activity Tolerance: Patient limited by pain Patient left: in chair;with call bell/phone within reach;with chair alarm set Nurse Communication: Mobility status PT Visit Diagnosis: Muscle weakness (generalized) (M62.81);Difficulty in walking, not elsewhere classified (R26.2);Pain Pain - Right/Left:  (lower) Pain - part of body:  (back)    Time: 1026-1110 PT Time Calculation (min) (ACUTE ONLY): 44 min   Charges:   PT Evaluation $PT Eval Moderate Complexity: 1 Mod        Andrik Sandt B. [place], [place], DPT [phone]    09/14/2016, 11:28 AM

## 2016-09-14 NOTE — Care Management Note (Signed)
Case Management Note  Patient Details  Name: Daniel [name] MRN: [id number] Date of Birth: [birth year]  Subjective/Objective:   Pt s/p lumbar surgery. He is from home with spouse.                 Action/Plan: PT/OT recommending SNF. CSW following. CM following for d/c disposition.   Expected Discharge Date:                  Expected Discharge Plan:  Skilled Nursing Facility  In-House Referral:  Clinical Social Work  Discharge planning Services     Post Acute Care Choice:    Choice offered to:     DME Arranged:    DME Agency:     HH Arranged:    [place] Agency:     Status of Service:  In process, will continue to follow  If discussed at Long Length of Stay Meetings, dates discussed:    Additional Comments:  Pollie Friar, RN 09/14/2016, 2:56 PM

## 2016-09-14 NOTE — NC FL2 (Signed)
[place] LEVEL OF CARE SCREENING TOOL     IDENTIFICATION  Patient Name: Daniel [name] Birthdate: 04-Dec-1935 Sex: male Admission Date (Current Location): 09/12/2016  [facility] and [place] Number:  Herbalist and Address:  [facility] [place]. [facility], [place] [address], [place], [place] [address]      Provider Number: [id number]  Attending Physician Name and Address:  Kristeen Miss, MD  Relative Name and Phone Number:       Current Level of Care: Hospital Recommended Level of Care: [place] Prior Approval Number:    Date Approved/Denied:   PASRR Number: Pending  Discharge Plan: SNF    Current Diagnoses: Patient Active Problem List   Diagnosis Date Noted  . Spondylolisthesis of lumbar region 09/12/2016  . Essential hypertension 09/12/2016  . Surgery, elective   . Diarrhea 06/08/2016  . Memory loss 03/09/2016  . Mild cognitive impairment 03/09/2016  . Essential tremor 03/09/2016  . Diabetic neuropathy ([place]) 03/09/2016  . Vitamin D deficiency 03/09/2016    Orientation RESPIRATION BLADDER Height & Weight     Self, Situation  Normal Continent Weight: 173 lb 9.6 oz (78.7 kg) Height:  6' (182.9 cm)  BEHAVIORAL SYMPTOMS/MOOD NEUROLOGICAL BOWEL NUTRITION STATUS      Continent    AMBULATORY STATUS COMMUNICATION OF NEEDS Skin   Extensive Assist Verbally Surgical wounds                       Personal Care Assistance Level of Assistance  Bathing, Dressing Bathing Assistance: Limited assistance   Dressing Assistance: Limited assistance     Functional Limitations Info             SPECIAL CARE FACTORS FREQUENCY  PT (By licensed PT), OT (By licensed OT)     PT Frequency: 5x/wk OT Frequency: 5x/wk            Contractures      Additional Factors Info  Code Status, Allergies, Insulin Sliding Scale Code Status Info: Full Allergies Info: Donepezil   Insulin Sliding Scale Info: 3x/day       Current  Medications (09/14/2016):  This is the current hospital active medication list Current Facility-Administered Medications  Medication Dose Route Frequency Provider Last Rate Last Dose  . 0.9 %  sodium chloride infusion  250 mL Intravenous Continuous Kristeen Miss, MD      . acetaminophen (TYLENOL) tablet 650 mg  650 mg Oral Q4H PRN Kristeen Miss, MD       Or  . acetaminophen (TYLENOL) suppository 650 mg  650 mg Rectal Q4H PRN Kristeen Miss, MD      . atorvastatin (LIPITOR) tablet 40 mg  40 mg Oral q1800 Kristeen Miss, MD   40 mg at 09/13/16 1713  . bisacodyl (DULCOLAX) suppository 10 mg  10 mg Rectal Daily PRN Kristeen Miss, MD      . dexamethasone (DECADRON) tablet 4 mg  4 mg Oral Q12H Kristeen Miss, MD   4 mg at 09/14/16 1106  . docusate sodium (COLACE) capsule 100 mg  100 mg Oral BID Kristeen Miss, MD   100 mg at 09/14/16 0839  . ezetimibe (ZETIA) tablet 10 mg  10 mg Oral QPM Kristeen Miss, MD   10 mg at 09/13/16 1713  . haloperidol lactate (HALDOL) injection 2 mg  2 mg Intravenous Q6H PRN Kary Kos, MD   2 mg at 09/14/16 0041  . HYDROcodone-acetaminophen (NORCO/VICODIN) 5-325 MG per tablet 1-2 tablet  1-2  tablet Oral Q4H PRN Kristeen Miss, MD   1 tablet at 09/14/16 [id number]  . HYDROmorphone (DILAUDID) injection 0.5 mg  0.5 mg Intravenous Q2H PRN Kristeen Miss, MD   0.5 mg at 09/13/16 2016  . insulin aspart (novoLOG) injection 0-15 Units  0-15 Units Subcutaneous TID WC Kristeen Miss, MD   5 Units at 09/14/16 1259  . lactated ringers infusion   Intravenous Continuous Janeece Riggers, MD   Stopped at 09/12/16 1826  . lactated ringers infusion   Intravenous Continuous Kristeen Miss, MD   Stopped at 09/13/16 1350  . menthol-cetylpyridinium (CEPACOL) lozenge 3 mg  1 lozenge Oral PRN Kristeen Miss, MD       Or  . phenol (CHLORASEPTIC) mouth spray 1 spray  1 spray Mouth/Throat PRN Kristeen Miss, MD   1 spray at 09/13/16 0809  . metFORMIN (GLUCOPHAGE) tablet 500 mg  500 mg Oral BID WC Kristeen Miss, MD    500 mg at 09/14/16 0839  . methocarbamol (ROBAXIN) tablet 500 mg  500 mg Oral Q6H PRN Kristeen Miss, MD       Or  . methocarbamol (ROBAXIN) 500 mg in dextrose 5 % 50 mL IVPB  500 mg Intravenous Q6H PRN Kristeen Miss, MD      . ondansetron (ZOFRAN) tablet 4 mg  4 mg Oral Q6H PRN Kristeen Miss, MD       Or  . ondansetron (ZOFRAN) injection 4 mg  4 mg Intravenous Q6H PRN Kristeen Miss, MD      . pantoprazole (PROTONIX) EC tablet 40 mg  40 mg Oral Daily Kristeen Miss, MD   40 mg at 09/14/16 0839  . polyethylene glycol (MIRALAX / GLYCOLAX) packet 17 g  17 g Oral Daily PRN Kristeen Miss, MD      . pregabalin (LYRICA) capsule 50 mg  50 mg Oral BID Kristeen Miss, MD   50 mg at 09/14/16 5597  . ramipril (ALTACE) capsule 2.5 mg  2.5 mg Oral Daily Kristeen Miss, MD   2.5 mg at 09/14/16 4163  . senna (SENOKOT) tablet 8.6 mg  1 tablet Oral BID Kristeen Miss, MD   8.6 mg at 09/14/16 0839  . sodium chloride flush (NS) 0.9 % injection 3 mL  3 mL Intravenous Q12H Kristeen Miss, MD   3 mL at 09/14/16 1000  . sodium chloride flush (NS) 0.9 % injection 3 mL  3 mL Intravenous PRN Kristeen Miss, MD      . sodium phosphate (FLEET) 7-19 GM/118ML enema 1 enema  1 enema Rectal Once PRN Kristeen Miss, MD         Discharge Medications: Please see discharge summary for a list of discharge medications.  Relevant Imaging Results:  Relevant Lab Results:   Additional Information SS#: [id number]  Geralynn Ochs, LCSW

## 2016-09-14 NOTE — Anesthesia Postprocedure Evaluation (Signed)
Anesthesia Post Note  Patient: Daniel [name]  Procedure(s) Performed: Procedure(s) (LRB): Lumbar three-four,  Lumbar four- five Posterior lumbar interbody fusion (N/A)     Patient location during evaluation: PACU Anesthesia Type: General Level of consciousness: awake and alert Pain management: pain level controlled Vital Signs Assessment: post-procedure vital signs reviewed and stable Respiratory status: spontaneous breathing, nonlabored ventilation, respiratory function stable and patient connected to nasal cannula oxygen Cardiovascular status: blood pressure returned to baseline and stable Postop Assessment: no signs of nausea or vomiting Anesthetic complications: no    Last Vitals:  Vitals:   09/14/16 0124 09/14/16 0522  BP: (!) 107/50 (!) 118/59  Pulse: 95 86  Resp: 20 20  Temp: 37 C 37 C  SpO2: 99% 97%    Last Pain:  Vitals:   09/14/16 0522  TempSrc: Oral  PainSc:                  [place] S

## 2016-09-15 LAB — GLUCOSE, CAPILLARY
Glucose-Capillary: 220 mg/dL — ABNORMAL HIGH (ref 65–99)
Glucose-Capillary: 244 mg/dL — ABNORMAL HIGH (ref 65–99)
Glucose-Capillary: 231 mg/dL — ABNORMAL HIGH (ref 65–99)
Glucose-Capillary: 258 mg/dL — ABNORMAL HIGH (ref 65–99)

## 2016-09-15 MED ORDER — MAGNESIUM CITRATE PO SOLN
1.0000 | Freq: Once | ORAL | Status: AC
  Administered 2016-09-15: 1 via ORAL
  Filled 2016-09-15: qty 296

## 2016-09-15 MED ORDER — DEXAMETHASONE 2 MG PO TABS
2.0000 mg | ORAL_TABLET | Freq: Two times a day (BID) | ORAL | Status: DC
  Administered 2016-09-15 – 2016-09-20 (×10): 2 mg via ORAL
  Filled 2016-09-15 (×10): qty 1

## 2016-09-15 NOTE — Progress Notes (Signed)
The patients room was very cool and the television was loud with the sounds of the news.  The patient with hands shaking grabbed the remote to turn down the TV when I entered and introduced myself.  Patient was reading book by Renetta Chalk and Benjamin Stain.  I noticed the thermostat on 68.  Upon initial introduction to patient I explained that I was a chaplain and my role was to provide care for him as a patient.    Patient asked if I could get him a new back and I explained that I do not have any more in inventory.  We engaged in conversation and Abdur did not appear to be at a loss for memory initially.  As the conversation wore on, it turned out that the patient had family ties to an area where I grew up.  Patient is also the 1st cousin of TV Environmental education officer and evangelist [employer].    Patient gave no indication of worry, although slight grimaces from moving and the pain associated were visible.  Patients major concern was Alzheimers and his ability to remember less and less.  Chaplain provided guidance around his concern for his ability too remember less and less and he suggested writing things down.

## 2016-09-15 NOTE — Progress Notes (Signed)
Physical Therapy Treatment Patient Details Name: Daniel [name] MRN: [id number] DOB: [birth year] Today's Date: 09/15/2016    History of Present Illness 81 y.o. male admitted on 09/12/16 for lumbar decompression and fusion.  Pt with significant PMH of HTN, DM, Alzheimer's dementia, and L RTC repair.    PT Comments    Pt is up to chair after some coaching and encouraging with application of brace in sitting.  He is following instructions with repetition esp the transition to the chair with RW.  He is propped on sides for support of sitting but also pain management and has alarm on chair.  Nursing filled in on the visit and agreed to keep an eye on him for his safety although pt is quite unlikely to attempt standing alone.  Pt will receive meds per nsg for management of increased pain from transition to chair.   Follow Up Recommendations  SNF     Equipment Recommendations  Rolling walker with 5" wheels;3in1 (PT)    Recommendations for Other Services       Precautions / Restrictions Precautions Precautions: Fall;Back Precaution Comments: reviewed precautions as pt does not remember them Required Braces or Orthoses: Spinal Brace Spinal Brace: Lumbar corset;Applied in sitting position Restrictions Weight Bearing Restrictions: No Other Position/Activity Restrictions: did provide lateral support once up in chair    Mobility  Bed Mobility Overal bed mobility: Needs Assistance Bed Mobility: Sidelying to Sit Rolling: Min assist Sidelying to sit: Mod assist       General bed mobility comments: verbal and tactile cues to roll and supported sidesitting effort  Transfers Overall transfer level: Needs assistance Equipment used: Rolling walker (2 wheeled);1 person hand held assist Transfers: Sit to/from Stand Sit to Stand: Mod assist;From elevated surface         General transfer comment: standing from bed and chair with pt using hands to support the  effort  Ambulation/Gait Ambulation/Gait assistance: Min assist Ambulation Distance (Feet): 5 Feet Assistive device: Rolling walker (2 wheeled);1 person hand held assist Gait Pattern/deviations: Step-to pattern;Narrow base of support;Shuffle;Decreased stride length Gait velocity: reduced Gait velocity interpretation: Below normal speed for age/gender General Gait Details: pt needs to get continual specific cues for body mechanics and effort   Stairs            Wheelchair Mobility    Modified Rankin (Stroke Patients Only)       Balance Overall balance assessment: Needs assistance Sitting-balance support: Feet supported;Bilateral upper extremity supported Sitting balance-Leahy Scale: Fair     Standing balance support: Bilateral upper extremity supported Standing balance-Leahy Scale: Poor Standing balance comment: cued upright standing as pt tends to want to slump                            Cognition Arousal/Alertness: Awake/alert Behavior During Therapy: WFL for tasks assessed/performed Overall Cognitive Status: History of cognitive impairments - at baseline                                        Exercises      General Comments        Pertinent Vitals/Pain Pain Assessment: 0-10 Pain Score: 7  Pain Location: surgical site Pain Descriptors / Indicators: Operative site guarding Pain Intervention(s): Monitored during session;Premedicated before session;Repositioned;Patient requesting pain meds-RN notified    Home Living  Prior Function            PT Goals (current goals can now be found in the care plan section) Acute Rehab PT Goals Patient Stated Goal: to feel better Progress towards PT goals: Progressing toward goals    Frequency    Min 5X/week      PT Plan Current plan remains appropriate    Co-evaluation              AM-PAC PT "6 Clicks" Daily Activity  Outcome Measure   Difficulty turning over in bed (including adjusting bedclothes, sheets and blankets)?: Unable Difficulty moving from lying on back to sitting on the side of the bed? : Unable Difficulty sitting down on and standing up from a chair with arms (e.g., wheelchair, bedside commode, etc,.)?: Unable Help needed moving to and from a bed to chair (including a wheelchair)?: A Little Help needed walking in hospital room?: A Little Help needed climbing 3-5 steps with a railing? : A Lot 6 Click Score: 11    End of Session Equipment Utilized During Treatment: Back brace Activity Tolerance: Patient limited by pain;Other (comment);Treatment limited secondary to agitation (mild confusion about the sequence) Patient left: in chair;with call bell/phone within reach;with chair alarm set;with nursing/sitter in room Nurse Communication: Mobility status PT Visit Diagnosis: Muscle weakness (generalized) (M62.81);Difficulty in walking, not elsewhere classified (R26.2);Pain Pain - Right/Left:  (bilateral back) Pain - part of body:  (back)     Time: 2703-5009 PT Time Calculation (min) (ACUTE ONLY): 31 min  Charges:  $Gait Training: 8-22 mins $Therapeutic Activity: 8-22 mins                    G Codes:  Functional Assessment Tool Used: AM-PAC 6 Clicks Basic Mobility     Ramond Dial 09/15/2016, 5:02 PM   Mee Hives, PT MS Acute Rehab Dept. Number: [place] and [place]

## 2016-09-15 NOTE — Progress Notes (Signed)
Vital signs are stable Incision is clean and dry C/o pain in back and some in hips Abdomen distended, no BM since post op Patient had complex dural tear that was closed I do not want to discharge him until it is quite certain that he is not having any csf leakage;  Therefore, I do not anticipate transfer to snf until Monday at the earliest.

## 2016-09-15 NOTE — Progress Notes (Signed)
Patient refuses to ambulate says the pain is to great.

## 2016-09-15 NOTE — Progress Notes (Signed)
Inpatient Diabetes Program Recommendations  AACE/ADA: New Consensus Statement on Inpatient Glycemic Control (2015)  Target Ranges:  Prepandial:   less than 140 mg/dL      Peak postprandial:   less than 180 mg/dL (1-2 hours)      Critically ill patients:  140 - 180 mg/dL   Lab Results  Component Value Date   GLUCAP 220 (H) 09/15/2016   HGBA1C 6.9 (H) 09/06/2016    Review of Glycemic ControlResults for JACARRI, GESNER (MRN [id number]) as of 09/15/2016 10:09  Ref. Range 09/14/2016 06:21 09/14/2016 11:14 09/14/2016 17:18 09/14/2016 21:11 09/15/2016 06:22  Glucose-Capillary Latest Ref Range: 65 - 99 mg/dL 172 (H) 217 (H) 196 (H) 251 (H) 220 (H)   Inpatient Diabetes Program Recommendations:    Note blood sugars >goal. While on PO Decadron, please consider adding Novolog meal coverage 4 units tid with meals (hold if patient eats less than 50%).  Thanks, Adah Perl, RN, BC-ADM Inpatient Diabetes Coordinator Pager [phone] (8a-5p)

## 2016-09-15 NOTE — Care Management Important Message (Signed)
Important Message  Patient Details  Name: Daniel [name] MRN: [id number] Date of Birth: [birth year]   Medicare Important Message Given:  Yes    Nathen May 09/15/2016, 9:32 AM

## 2016-09-16 LAB — GLUCOSE, CAPILLARY
GLUCOSE-CAPILLARY: 223 mg/dL — AB (ref 65–99)
GLUCOSE-CAPILLARY: 201 mg/dL — AB (ref 65–99)
Glucose-Capillary: 210 mg/dL — ABNORMAL HIGH (ref 65–99)
Glucose-Capillary: 167 mg/dL — ABNORMAL HIGH (ref 65–99)

## 2016-09-16 NOTE — Progress Notes (Signed)
Physical Therapy Treatment Patient Details Name: Daniel [name] MRN: [id number] DOB: 1935/01/29 Today's Date: 09/16/2016    History of Present Illness 81 y.o. male admitted on 09/12/16 for lumbar decompression and fusion.  Pt with significant PMH of HTN, DM, Alzheimer's dementia, and L RTC repair.    PT Comments    Pt performed increased gait during session.  Pt remains slow and guarded/fearful of mobilizing.  Pt remains a fall risk and will require assistance post acute to improve strength before returning home.  Plan next session to continue education with spinal brace/ precautions and advance mobility.   Follow Up Recommendations  SNF     Equipment Recommendations  Rolling walker with 5" wheels;3in1 (PT)    Recommendations for Other Services       Precautions / Restrictions Precautions Precautions: Fall;Back Precaution Comments: reviewed precautions as pt does not remember them Required Braces or Orthoses: Spinal Brace Spinal Brace: Lumbar corset;Applied in sitting position    Mobility  Bed Mobility Overal bed mobility: Needs Assistance Bed Mobility: Rolling;Sidelying to Sit;Sit to Sidelying Rolling: Min assist Sidelying to sit: Mod assist     Sit to sidelying: Mod assist General bed mobility comments: Pt required assist for LE advancement into/out of the bed.  Assist for trunk elevation and descent of trunk back to sidelying.    Transfers Overall transfer level: Needs assistance Equipment used: Rolling walker (2 wheeled) Transfers: Sit to/from Stand Sit to Stand: Mod assist;From elevated surface         General transfer comment: Pt performed x 2 trials.  On first attempt RW to short and returned to sitting to adjust RW height for improved fit.  Cues for hand placement to and from seated surface.    Ambulation/Gait Ambulation/Gait assistance: Min assist Ambulation Distance (Feet): 35 Feet Assistive device: Rolling walker (2 wheeled) Gait Pattern/deviations:  Step-to pattern;Narrow base of support;Shuffle;Decreased stride length     General Gait Details: Cues for upper trunk control, RW position/proximity and increasing stride length bilaterally.  Pt slow and guarded.    Stairs            Wheelchair Mobility    Modified Rankin (Stroke Patients Only)       Balance Overall balance assessment: Needs assistance   Sitting balance-Leahy Scale: Fair       Standing balance-Leahy Scale: Poor                              Cognition Arousal/Alertness: Awake/alert Behavior During Therapy: WFL for tasks assessed/performed Overall Cognitive Status: History of cognitive impairments - at baseline                                 General Comments: Wife reports he has difficulty following commands.        Exercises      General Comments        Pertinent Vitals/Pain Pain Assessment: 0-10 Pain Score: 7  Pain Location: surgical site Pain Descriptors / Indicators: Operative site guarding Pain Intervention(s): Monitored during session;Repositioned    Home Living                      Prior Function            PT Goals (current goals can now be found in the care plan section) Acute Rehab PT Goals Patient Stated Goal: to feel  better Potential to Achieve Goals: Good Progress towards PT goals: Progressing toward goals    Frequency    Min 5X/week      PT Plan Current plan remains appropriate    Co-evaluation              AM-PAC PT "6 Clicks" Daily Activity  Outcome Measure  Difficulty turning over in bed (including adjusting bedclothes, sheets and blankets)?: Unable Difficulty moving from lying on back to sitting on the side of the bed? : Unable Difficulty sitting down on and standing up from a chair with arms (e.g., wheelchair, bedside commode, etc,.)?: Unable Help needed moving to and from a bed to chair (including a wheelchair)?: A Lot Help needed walking in hospital room?: A  Lot Help needed climbing 3-5 steps with a railing? : A Lot 6 Click Score: 9    End of Session Equipment Utilized During Treatment: Back brace;Gait belt Activity Tolerance: Patient tolerated treatment well Patient left: with call bell/phone within reach;with chair alarm set;with nursing/sitter in room;in bed;with family/visitor present Nurse Communication: Mobility status PT Visit Diagnosis: Muscle weakness (generalized) (M62.81);Difficulty in walking, not elsewhere classified (R26.2);Pain Pain - Right/Left:  (B back) Pain - part of body:  (back)     Time: 9201-0071 PT Time Calculation (min) (ACUTE ONLY): 25 min  Charges:  $Gait Training: 8-22 mins $Therapeutic Activity: 8-22 mins                    G Codes:       Governor Rooks, PTA pager [phone]    Cristela Blue 09/16/2016, 4:00 PM

## 2016-09-16 NOTE — Progress Notes (Signed)
Patient ID: Edgardo Roys, male   DOB: 09/03/1935, 81 y.o.   MRN: [id number] Subjective:  the patient is alert and pleasant. HE IS IN NO APPARENT DISTRESS>  Objective: Vital signs in last 24 hours: Temp:  [97.8 F (36.6 C)-99.1 F (37.3 C)] 97.8 F (36.6 C) (09/01 1005) Pulse Rate:  [74-100] 74 (09/01 1005) Resp:  [18-20] 18 (09/01 1005) BP: (150-161)/(70-84) 157/77 (09/01 1005) SpO2:  [95 %-97 %] 96 % (09/01 1005)  Intake/Output from previous day: 08/31 0701 - 09/01 0700 In: 360 [P.O.:360] Out: 1500 [Urine:1500] Intake/Output this shift: Total I/O In: -  Out: 100 [Urine:100]  Physical exam  THE PATIENT  IS ALERT AND ORIENTED> His strength is normal.His dressing is clean and dry.  Lab Results:  Recent Labs  09/14/16 0524  WBC 15.1*  HGB 10.2*  HCT 29.9*  PLT 134*   BMET  Recent Labs  09/14/16 0524  NA 139  K 3.7  CL 105  CO2 26  GLUCOSE 179*  BUN 17  CREATININE 1.09  CALCIUM 8.4*    Studies/Results: No results found.  Assessment/Plan: Postop day #4: I encouraged the patient to mobilize.  LOS: 4 days     Cornesha Radziewicz D 09/16/2016, 12:04 PM

## 2016-09-17 LAB — GLUCOSE, CAPILLARY
GLUCOSE-CAPILLARY: 196 mg/dL — AB (ref 65–99)
GLUCOSE-CAPILLARY: 150 mg/dL — AB (ref 65–99)
Glucose-Capillary: 244 mg/dL — ABNORMAL HIGH (ref 65–99)
Glucose-Capillary: 186 mg/dL — ABNORMAL HIGH (ref 65–99)

## 2016-09-17 NOTE — Progress Notes (Signed)
Pt is moving very slowly and refuses to get out of bed after multiple attempts of trying to get him up. Incision is clean and dry. New dressing applied.

## 2016-09-17 NOTE — Progress Notes (Signed)
Physical Therapy Treatment Patient Details Name: Daniel [name] MRN: [id number] DOB: 19-Sep-1935 Today's Date: 09/17/2016    History of Present Illness 81 y.o. male admitted on 09/12/16 for lumbar decompression and fusion.  Pt with significant PMH of HTN, DM, Alzheimer's dementia, and L RTC repair.    PT Comments    Continuing work on functional mobility and activity tolerance; Responded well to encouragement today (walked and talked about college and grad school exploits); Able to walk further, and even after a relatively long standing bout to use the urinal;   Continue to recommend post-acute rehab; Noted Dr. Ellene [name] would like to consider CIR; will need to see continuing and more consistent participation  Follow Up Recommendations  SNF -- Dr. Ellene [name] wants to consider CIR     Equipment Recommendations  Rolling walker with 5" wheels;3in1 (PT)    Recommendations for Other Services       Precautions / Restrictions Precautions Precautions: Fall;Back Precaution Comments: reviewed precautions as pt does not remember them Required Braces or Orthoses: Spinal Brace Spinal Brace: Lumbar corset;Applied in sitting position    Mobility  Bed Mobility Overal bed mobility: Needs Assistance Bed Mobility: Rolling;Sidelying to Sit;Sit to Sidelying Rolling: Min assist Sidelying to sit: Min assist       General bed mobility comments: Pt required assist for LE advancement into/out of the bed.  Assist for trunk elevation and descent of trunk back to sidelying.    Transfers Overall transfer level: Needs assistance Equipment used: Rolling walker (2 wheeled) Transfers: Sit to/from Stand Sit to Stand: Mod assist;From elevated surface         General transfer comment: Cues for hand placement and safety; Mod assis tto power up and to control descent to sit  Ambulation/Gait Ambulation/Gait assistance: Min assist (visitor pushed chair behind) Ambulation Distance (Feet): 55 Feet Assistive  device: Rolling walker (2 wheeled) Gait Pattern/deviations: Step-to pattern;Narrow base of support;Shuffle;Decreased stride length Gait velocity: reduced Gait velocity interpretation: Below normal speed for age/gender General Gait Details: Cues for upper trunk control, RW position/proximity and increasing stride length bilaterally.  Pt slow and guarded.    Stairs            Wheelchair Mobility    Modified Rankin (Stroke Patients Only)       Balance     Sitting balance-Daniel [name] Scale: Fair       Standing balance-Daniel [name] Scale: Poor                              Cognition Arousal/Alertness: Awake/alert Behavior During Therapy: WFL for tasks assessed/performed Overall Cognitive Status: History of cognitive impairments - at baseline                                        Exercises      General Comments        Pertinent Vitals/Pain Pain Assessment: 0-10 Pain Score: 8  Pain Location: surgical site Pain Descriptors / Indicators: Operative site guarding Pain Intervention(s): Monitored during session    Home Living                      Prior Function            PT Goals (current goals can now be found in the care plan section) Acute Rehab PT Goals Patient Stated Goal: to feel  better PT Goal Formulation: With patient Time For Goal Achievement: 09/21/16 Potential to Achieve Goals: Good Progress towards PT goals: Progressing toward goals    Frequency    Min 5X/week      PT Plan Current plan remains appropriate    Co-evaluation              AM-PAC PT "6 Clicks" Daily Activity  Outcome Measure  Difficulty turning over in bed (including adjusting bedclothes, sheets and blankets)?: A Lot Difficulty moving from lying on back to sitting on the side of the bed? : A Lot Difficulty sitting down on and standing up from a chair with arms (e.g., wheelchair, bedside commode, etc,.)?: Unable Help needed moving to and from  a bed to chair (including a wheelchair)?: A Lot Help needed walking in hospital room?: A Lot Help needed climbing 3-5 steps with a railing? : A Lot 6 Click Score: 11    End of Session Equipment Utilized During Treatment: Back brace Activity Tolerance: Patient tolerated treatment well Patient left: in chair;with call bell/phone within reach;with family/visitor present Nurse Communication: Mobility status PT Visit Diagnosis: Muscle weakness (generalized) (M62.81);Difficulty in walking, not elsewhere classified (R26.2);Pain Pain - part of body:  (Back)     Time: 1425-1510 PT Time Calculation (min) (ACUTE ONLY): 45 min  Charges:  $Gait Training: 8-22 mins $Therapeutic Activity: 23-37 mins                    G Codes:       Roney Marion, PT  Acute Rehabilitation Services Pager [phone] Office [phone]    [name] 09/17/2016, 3:23 PM

## 2016-09-17 NOTE — Progress Notes (Signed)
Vital signs are stable Patient appears to be moving very slowly and does not have much motivation to get out of bed I have encouraged the nurses to mobilize him and encourage mobility is much as possible I remove the gauze from the lumbar incision today I reinforced the need to do daily dressing change For now the incision remains clean and dry Patient will likely need skilled nursing placement but I will ask for rehabilitation to see him regarding comprehensive inpatient rehabilitation

## 2016-09-18 ENCOUNTER — Encounter (HOSPITAL_COMMUNITY): Payer: Self-pay | Admitting: Physical Medicine and Rehabilitation

## 2016-09-18 DIAGNOSIS — I10 Essential (primary) hypertension: Secondary | ICD-10-CM

## 2016-09-18 DIAGNOSIS — D62 Acute posthemorrhagic anemia: Secondary | ICD-10-CM

## 2016-09-18 DIAGNOSIS — D696 Thrombocytopenia, unspecified: Secondary | ICD-10-CM

## 2016-09-18 DIAGNOSIS — G8929 Other chronic pain: Secondary | ICD-10-CM

## 2016-09-18 DIAGNOSIS — M549 Dorsalgia, unspecified: Secondary | ICD-10-CM

## 2016-09-18 DIAGNOSIS — F0280 Dementia in other diseases classified elsewhere without behavioral disturbance: Secondary | ICD-10-CM

## 2016-09-18 DIAGNOSIS — I9581 Postprocedural hypotension: Secondary | ICD-10-CM

## 2016-09-18 DIAGNOSIS — M4316 Spondylolisthesis, lumbar region: Secondary | ICD-10-CM

## 2016-09-18 DIAGNOSIS — G9611 Dural tear: Secondary | ICD-10-CM

## 2016-09-18 DIAGNOSIS — G8918 Other acute postprocedural pain: Secondary | ICD-10-CM

## 2016-09-18 DIAGNOSIS — G309 Alzheimer's disease, unspecified: Secondary | ICD-10-CM

## 2016-09-18 DIAGNOSIS — D72829 Elevated white blood cell count, unspecified: Secondary | ICD-10-CM

## 2016-09-18 LAB — GLUCOSE, CAPILLARY
GLUCOSE-CAPILLARY: 209 mg/dL — AB (ref 65–99)
GLUCOSE-CAPILLARY: 216 mg/dL — AB (ref 65–99)
Glucose-Capillary: 183 mg/dL — ABNORMAL HIGH (ref 65–99)
Glucose-Capillary: 170 mg/dL — ABNORMAL HIGH (ref 65–99)

## 2016-09-18 NOTE — Progress Notes (Signed)
Inpatient Rehabilitation  Met with patient and spouse to discuss team's recommendation for IP Rehab.  Shared booklets and answered questions.  Spouse is familiar with IP Rehab and in favor of our program as their son lives with them and can provide 24/7 supervision and assist upon discharge home.  Patient reports being somewhat limited by pain; discussed administration of pain meds prior to therapy.  Plan to initiate insurance authorization tomorrow.  Please call with questions.    Carmelia Roller., CCC/SLP Admission Coordinator  [place]  Cell [phone]

## 2016-09-18 NOTE — Progress Notes (Signed)
Vital signs stable Motor function is intact He seems to be improving slowly Hopeful for stay at rehabilitation

## 2016-09-18 NOTE — Consult Note (Signed)
Physical Medicine and Rehabilitation Consult  Reason for Consult: Lumbar stenosis with deficits in mobility.  Referring Physician: Dr. Ellene Route   HPI: Daniel [name] is a 81 y.o. male with history of T2DM, Alzheimer's back pain radiating to LE and progressive decline in gait due to severe lumbar stenosis. History taken from chart review. He failed conservative management. He was admitted for 09/12/16 for lumbar decompression L3-L5 by Dr. Tollie Pizza. Post op with hypotension and on bed rest due to intraoperative dural tear repair.  Therapy initiated and patient with deficits in mobility and ability to carry out ADL tasks. MD requesting CIR for progressive therapy.    Review of Systems  Unable to perform ROS: Mental acuity  HENT: Negative for hearing loss.   Eyes: Negative for blurred vision and double vision.  Respiratory: Negative for cough and shortness of breath.   Cardiovascular: Negative for chest pain and palpitations.  Gastrointestinal: Positive for constipation. Negative for heartburn.  Musculoskeletal: Positive for back pain and myalgias.  Skin: Negative for rash.  Neurological: Positive for sensory change (bilateral feet numb "due to diarrhea" ) and weakness.  Psychiatric/Behavioral: Positive for memory loss.  All other systems reviewed and are negative.  Past Medical History:  Diagnosis Date  . Alzheimer disease   . Arthritis   . Cancer (HCC)    Skin  . Diabetes mellitus without complication ([place])   . GERD (gastroesophageal reflux disease)   . Hyperlipidemia   . Hypertension     Past Surgical History:  Procedure Laterality Date  . BACK SURGERY    . CATARACT EXTRACTION Bilateral 1987 89  . ROTATOR CUFF REPAIR Left 2003    Family History  Problem Relation Age of Onset  . Alzheimer's disease Mother   . Alzheimer's disease Maternal Grandfather       Social History:  Married. Lives with wife and disabled son. Retired Engineer, maintenance (IT).  He was unable to state prior level  of activity but needed assistance PTA per reports.  Per reports that he quit smoking about 38 years ago. He has never used smokeless tobacco. Per reports that he drinks alcohol. He reports that he does not use drugs.     Allergies  Allergen Reactions  . Donepezil Diarrhea    Medications Prior to Admission  Medication Sig Dispense Refill  . Alpha Lipoic Acid 200 MG CAPS Take 1 capsule by mouth daily.    Marland Kitchen atorvastatin (LIPITOR) 40 MG tablet Take 40 mg by mouth every morning.     . celecoxib (CELEBREX) 200 MG capsule Take 200 mg by mouth every morning.     . Cholecalciferol ([employer] D3) 2000 units CAPS Take 1 capsule by mouth every morning.     . ezetimibe (ZETIA) 10 MG tablet Take 10 mg by mouth every evening.   3  . LYRICA 50 MG capsule Take 50 mg by mouth 2 (two) times daily.   3  . metFORMIN (GLUCOPHAGE) 500 MG tablet Take 500 mg by mouth 2 (two) times daily.  2  . Multiple Minerals-Vitamins (CALCIUM-MAGNESIUM-ZINC-D3) TABS Take 1 tablet by mouth every morning.     . Multiple Vitamin (MULTIVITAMIN WITH MINERALS) TABS Take 1 tablet by mouth daily.    . pantoprazole (PROTONIX) 40 MG tablet Take 40 mg by mouth every morning.     . ramipril (ALTACE) 2.5 MG capsule Take 2.5 mg by mouth every morning.     . rivastigmine (EXELON) 9.5 mg/24hr Place 1 patch (9.5 mg total) onto the  skin daily. 30 patch 11    Home: Home Living Family/patient expects to be discharged to:: Private residence Living Arrangements: Children, Spouse/significant other (son who is disabled) Available Help at Discharge: Family Type of Home: House Home Access: Stairs to enter Technical brewer of Steps: 2 Entrance Stairs-Rails: Right Home Layout: Multi-level Alternate Level Stairs-Number of Steps: 8 Alternate Level Stairs-Rails: Left Bathroom Shower/Tub: Tub/shower unit, Multimedia programmer: Handicapped height Home Equipment: Shower seat - built in, [employer] - tub/shower Additional Comments: no family  available to confirm, pt reports all os the equipment   Functional History: Prior Function Level of Independence: Needs assistance Gait / Transfers Assistance Needed: did not use an AD before ADL's / Homemaking Assistance Needed: independent Communication / Swallowing Assistance Needed: intact Functional Status:  Mobility: Bed Mobility Overal bed mobility: Needs Assistance Bed Mobility: Rolling, Sidelying to Sit, Sit to Sidelying Rolling: Min assist Sidelying to sit: Min assist Sit to sidelying: Mod assist General bed mobility comments: Pt required assist for LE advancement into/out of the bed.  Assist for trunk elevation and descent of trunk back to sidelying.   Transfers Overall transfer level: Needs assistance Equipment used: Rolling walker (2 wheeled) Transfers: Sit to/from Stand Sit to Stand: Mod assist, From elevated surface Stand pivot transfers: +2 safety/equipment, Min assist General transfer comment: Cues for hand placement and safety; Mod assis tto power up and to control descent to sit Ambulation/Gait Ambulation/Gait assistance: Min assist (visitor pushed chair behind) Ambulation Distance (Feet): 55 Feet Assistive device: Rolling walker (2 wheeled) Gait Pattern/deviations: Step-to pattern, Narrow base of support, Shuffle, Decreased stride length General Gait Details: Cues for upper trunk control, RW position/proximity and increasing stride length bilaterally.  Pt slow and guarded.  Gait velocity: reduced Gait velocity interpretation: Below normal speed for age/gender    ADL: ADL Overall ADL's : Needs assistance/impaired Eating/Feeding: Set up, Sitting Grooming: Set up, Sitting Upper Body Bathing: Minimal assistance, Sitting Lower Body Bathing: Moderate assistance, +2 for physical assistance, Sit to/from stand Upper Body Dressing : Minimal assistance, Sitting Lower Body Dressing: Moderate assistance, +2 for physical assistance, Sit to/from stand Toilet Transfer:  Minimal assistance, +2 for physical assistance, Stand-pivot, BSC, RW Toilet Transfer Details (indicate cue type and reason): simulated with EOB>recliner Toileting- Clothing Manipulation and Hygiene: Moderate assistance, +2 for physical assistance Tub/ Shower Transfer: Walk-in shower, +2 for physical assistance, Minimal assistance, Stand-pivot, 3 in 1, Rolling walker General ADL Comments: Bed mobility and pivotal steps to recliner. Lots of encouragement and takes his time. Cued for not twisting during bed mobility.  Cognition: Cognition Overall Cognitive Status: History of cognitive impairments - at baseline Orientation Level: Oriented X4 Cognition Arousal/Alertness: Awake/alert Behavior During Therapy: WFL for tasks assessed/performed Overall Cognitive Status: History of cognitive impairments - at baseline General Comments: Wife reports he has difficulty following commands.     Blood pressure (!) 151/73, pulse 66, temperature 98.9 F (37.2 C), temperature source Oral, resp. rate 18, height 6' (1.829 m), weight 78.7 kg (173 lb 9.6 oz), SpO2 98 %. Physical Exam  Nursing note and vitals reviewed. Constitutional: He appears well-developed and well-nourished.  HENT:  Head: Normocephalic and atraumatic.  Eyes: Pupils are equal, round, and reactive to light. Conjunctivae and EOM are normal.  Neck: Normal range of motion. Neck supple.  Cardiovascular: Normal rate and regular rhythm.   Respiratory: Effort normal and breath sounds normal. Stridor present.  GI: Soft. Bowel sounds are normal.  Musculoskeletal: He exhibits no edema or tenderness.  Neurological: He is  alert.  Slow to process.    Very distracted and tangential.  Motor: 4/5 proximal to distal Sensation subjectively diminished to light touch right face DTRs symmetric A&Ox3, some confusion  Skin: Skin is warm and dry.  Psychiatric: He has a normal mood and affect. His speech is delayed and tangential. He is slowed. Cognition and  memory are impaired. He is inattentive.    Results for orders placed or performed during the hospital encounter of 09/12/16 (from the past 24 hour(s))  Glucose, capillary     Status: Abnormal   Collection Time: 09/17/16 11:18 AM  Result Value Ref Range   Glucose-Capillary 150 (H) 65 - 99 mg/dL  Glucose, capillary     Status: Abnormal   Collection Time: 09/17/16  4:07 PM  Result Value Ref Range   Glucose-Capillary 244 (H) 65 - 99 mg/dL  Glucose, capillary     Status: Abnormal   Collection Time: 09/17/16  9:49 PM  Result Value Ref Range   Glucose-Capillary 186 (H) 65 - 99 mg/dL  Glucose, capillary     Status: Abnormal   Collection Time: 09/18/16  7:00 AM  Result Value Ref Range   Glucose-Capillary 183 (H) 65 - 99 mg/dL   Comment 1 Notify RN    Comment 2 Document in Chart    No results found.  Assessment/Plan: Diagnosis: Back pain s/p L3-5 decompression Labs and images independently reviewed.  Records reviewed and summated above.  1. Does the need for close, 24 hr/day medical supervision in concert with the patient's rehab needs make it unreasonable for this patient to be served in a less intensive setting? Potentially 2. Co-Morbidities requiring supervision/potential complications: post-procedural hypotension (cont to monitor, consider meds if necessary), intraoperative dural tear (s/p repair), T2 DM (Monitor in accordance with exercise and adjust meds as necessary), Alzheimer's, back pain (Biofeedback training with therapies to help reduce reliance on opiate pain medications, monitor pain control during therapies, and sedation at rest and titrate to maximum efficacy to ensure participation and gains in therapies), HTN (monitor and provide prns in accordance with increased physical exertion and pain), leukocytosis (cont to monitor for signs and symptoms of infection, further workup if indicated), ABLA (transfuse if necessary to ensure appropriate perfusion for increased activity  tolerance), Thrombocytopenia (< 60,000/mm3 no resistive exercise) 3. Due to safety, disease management and patient education, does the patient require 24 hr/day rehab nursing? Yes 4. Does the patient require coordinated care of a physician, rehab nurse, PT (1-2 hrs/day, 5 days/week) and OT (1-2 hrs/day, 5 days/week) to address physical and functional deficits in the context of the above medical diagnosis(es)? Potentially Addressing deficits in the following areas: balance, endurance, locomotion, strength, transferring, bathing, dressing, toileting and psychosocial support 5. Can the patient actively participate in an intensive therapy program of at least 3 hrs of therapy per day at least 5 days per week? Potentially 6. The potential for patient to make measurable gains while on inpatient rehab is good 7. Anticipated functional outcomes upon discharge from inpatient rehab are supervision and min assist  with PT, supervision and min assist with OT, n/a with SLP. 8. Estimated rehab length of stay to reach the above functional goals is: 14-18 days. 9. Anticipated D/C setting: Other 10. Anticipated post D/C treatments: SNF 11. Overall Rehab/Functional Prognosis: good  RECOMMENDATIONS: This patient's condition is appropriate for continued rehabilitative care in the following setting: Pt will likely require less intense, longer length of rehab.  Will consider CIR if pt demonstrates ability to participate  in 3 hours therapy/day and support at discharge.  Patient has agreed to participate in recommended program. Potentially Note that insurance prior authorization may be required for reimbursement for recommended care.  Comment: Rehab Admissions Coordinator to follow up.  Delice Lesch, MD, [place], [place] 09/18/2016

## 2016-09-18 NOTE — Progress Notes (Signed)
Physical Therapy Treatment Patient Details Name: Daniel [name] MRN: [id number] DOB: 22-Mar-1935 Today's Date: 09/18/2016    History of Present Illness 81 y.o. male admitted on 09/12/16 for lumbar decompression and fusion.  Pt with significant PMH of HTN, DM, Alzheimer's dementia, and L RTC repair.    PT Comments    Pt able to walk a bit further in the hallway today with RW and chair to follow. Back handout given and reviewed with pt and his wife (as he has not been able to recall precautions given his Alzheimer's dementia).  Pt continues to be appropriate for post acute rehab at discharge.     Follow Up Recommendations  SNF     Equipment Recommendations  Rolling walker with 5" wheels;3in1 (PT)    Recommendations for Other Services   NA     Precautions / Restrictions Precautions Precautions: Fall;Back Precaution Comments: reviewed precautions as pt does not remember them, handout given and reviewed with pt and his wife. Required Braces or Orthoses: Spinal Brace Spinal Brace: Lumbar corset;Applied in sitting position    Mobility  Bed Mobility Overal bed mobility: Needs Assistance Bed Mobility: Rolling;Sidelying to Sit Rolling: Min assist Sidelying to sit: Min assist       General bed mobility comments: Min assist to roll to side, verbal and manual cues for log roll, reaching for rail, and cues to not twist backwards during log roll.  assist needed at trunk to get to upright sitting from minimally elelvated bed.  Heavy reliance on bed rail.   Transfers Overall transfer level: Needs assistance Equipment used: Rolling walker (2 wheeled) Transfers: Sit to/from Stand Sit to Stand: Mod assist         General transfer comment: Mod assist to support trunk and stabilize RW during transitions, pt flexed posture, and slow to rise.  Verbal cues for hand placement, however, pt pulling with both hands on RW to stand.    Ambulation/Gait Ambulation/Gait assistance: Min guard;Min  assist;+2 safety/equipment Ambulation Distance (Feet): 65 Feet Assistive device: Rolling walker (2 wheeled) Gait Pattern/deviations: Step-to pattern;Narrow base of support;Shuffle;Decreased stride length;Trunk flexed Gait velocity: decreased Gait velocity interpretation: <1.8 ft/sec, indicative of risk for recurrent falls General Gait Details: Chair to follow for increased safety and to encourage increased gait distance, verbal cues for upright posture and closer proximity to RW as well as increased step length.         Balance Overall balance assessment: Needs assistance Sitting-balance support: Feet supported Sitting balance-Leahy Scale: Fair Sitting balance - Comments: supervision EOB, not challenged     Standing balance-Leahy Scale: Poor Standing balance comment: relies on UE support on RW and support from therapist in standing.                            Cognition Arousal/Alertness: Awake/alert Behavior During Therapy: WFL for tasks assessed/performed Overall Cognitive Status: History of cognitive impairments - at baseline                                               Pertinent Vitals/Pain Pain Assessment: 0-10 Pain Score: 8  Pain Location: incisional Pain Descriptors / Indicators: Operative site guarding Pain Intervention(s): Limited activity within patient's tolerance;Monitored during session;Repositioned           PT Goals (current goals can now be found in the  care plan section) Acute Rehab PT Goals Patient Stated Goal: none stated today Progress towards PT goals: Progressing toward goals    Frequency    Min 5X/week      PT Plan Current plan remains appropriate       AM-PAC PT "6 Clicks" Daily Activity  Outcome Measure  Difficulty turning over in bed (including adjusting bedclothes, sheets and blankets)?: Unable Difficulty moving from lying on back to sitting on the side of the bed? : Unable Difficulty sitting down on  and standing up from a chair with arms (e.g., wheelchair, bedside commode, etc,.)?: Unable Help needed moving to and from a bed to chair (including a wheelchair)?: A Lot Help needed walking in hospital room?: A Little Help needed climbing 3-5 steps with a railing? : A Lot 6 Click Score: 10    End of Session Equipment Utilized During Treatment: Back brace;Gait belt Activity Tolerance: Patient limited by pain;Patient limited by fatigue Patient left: in chair;with call bell/phone within reach;with chair alarm set;with family/visitor present   PT Visit Diagnosis: Muscle weakness (generalized) (M62.81);Difficulty in walking, not elsewhere classified (R26.2);Pain Pain - Right/Left:  (lower) Pain - part of body:  (back)     Time: 6629-4765 PT Time Calculation (min) (ACUTE ONLY): 36 min  Charges:  $Gait Training: 8-22 mins $Therapeutic Activity: 8-22 mins          Aldyn Toon B. [place], [place], DPT [phone]            09/18/2016, 2:28 PM

## 2016-09-19 LAB — GLUCOSE, CAPILLARY
GLUCOSE-CAPILLARY: 175 mg/dL — AB (ref 65–99)
GLUCOSE-CAPILLARY: 146 mg/dL — AB (ref 65–99)
Glucose-Capillary: 189 mg/dL — ABNORMAL HIGH (ref 65–99)
Glucose-Capillary: 201 mg/dL — ABNORMAL HIGH (ref 65–99)

## 2016-09-19 NOTE — Discharge Summary (Addendum)
Date of admission 09/12/2016 Date of discharge: 09/20/2016 Condition on discharge: Improving Admitting diagnosis: Lumbar spondylosis with spondylolisthesis L3-4 and L4-5, lumbar stenosis, neurogenic claudication, lumbar radiculopathy. History of early Alzheimer's Discharge diagnosis: Lumbar spondylosis and spondylolisthesis L3-4 and L4-5. Lumbar stenosis. Neurogenic claudication. Lumbar radiculopathy. History of elderly Alzheimer's. Hospital course: Patient was admitted for surgical intervention to decompress and stabilize from L3-L5. The surgery went well save for the fact that he had a significant dural tear that took the lengthy time to close. Postoperatively the patient was slow to mobilize. He has gradually improved his mobility however he is significantly stiff and weak and in his lower extremities. He's been working with physical therapy but is reluctant to walk significantly. Is felt that a course of inpatient rehabilitation will help to strengthen his legs and improve his mobility substantially. His incision has remained clean and dry.the patient may shower without brace on. He is also written for Decadron taper over the next 5 days time. Follow-up will be as an outpatient in 3 weeks' time. Discharge medication: Hydrocodone 5/325 #60 no refills Methocarbamol 500 mg #40  3 refills Decadron 1 mg tablets #15 no refills, taper written   Discharge plan: Transfer patient to skilled nursing facility for further rehabilitation.

## 2016-09-19 NOTE — Progress Notes (Addendum)
Inpatient Rehabilitation  I have initiated insurance authorization with [employer] this morning; hopeful for admission today.  Plan to continue to follow for timing of medical readiness, insurance authorization, and IP Rehab bed availability.  Please call with questions.   Update: Earlier this afternoon I requested OT to see again today.  Carmelia Roller., CCC/SLP Admission Coordinator  [place]  Cell [phone]

## 2016-09-19 NOTE — Progress Notes (Signed)
Occupational Therapy Treatment Patient Details Name: Daniel [name] MRN: [id number] DOB: July 23, 1935 Today's Date: 09/19/2016    History of present illness 81 y.o. male admitted on 09/12/16 for lumbar decompression and fusion.  Pt with significant PMH of HTN, DM, Alzheimer's dementia, and L RTC repair.   OT comments  Pt making progress towards goals. Pt completed room level functional mobility using RW with MinA, requires increased time and rest breaks throughout. Pt completed toileting with MaxA. Requires assist for recalling back precautions and min verbal cues for adherence to precautions during session. Based upon Pt progress and Pt's PLOF, feel Pt would benefit from CIR services prior to return home to maximize Pt's safety and independence with ADLs and functional mobility. D/C recs have been updated to reflect. Will continue to follow acutely.    Follow Up Recommendations  CIR    Equipment Recommendations  Other (comment) (TBD in next venue )          Precautions / Restrictions Precautions Precautions: Fall;Back Precaution Comments: verbally reviewed precautions and log roll technique with pt during session.  Wife not present today; Pt not able to independently recall back precautions Required Braces or Orthoses: Spinal Brace Spinal Brace: Lumbar corset;Applied in sitting position Restrictions Weight Bearing Restrictions: No       Mobility Bed Mobility Overal bed mobility: Needs Assistance Bed Mobility: Sidelying to Sit Rolling: Min assist Sidelying to sit: Min assist       General bed mobility comments: Min assist and verbal cues to help pt sequence log roll to get to EOB, assist at trunk to transfer to sitting from side lying.   Transfers Overall transfer level: Needs assistance Equipment used: Rolling walker (2 wheeled) Transfers: Sit to/from Stand Sit to Stand: Min assist         General transfer comment: Heavy min assist to rise and steady; verbal cues for  hand placement     Balance Overall balance assessment: Needs assistance Sitting-balance support: Feet supported;No upper extremity supported Sitting balance-Leahy Scale: Fair Sitting balance - Comments: supervision EOB, not challenged   Standing balance support: Bilateral upper extremity supported Standing balance-Leahy Scale: Poor Standing balance comment: relies on UE support on RW and support from therapist in standing.                           ADL either performed or assessed with clinical judgement   ADL Overall ADL's : Needs assistance/impaired     Grooming: Set up;Sitting                   Toilet Transfer: Minimal assistance;Ambulation;BSC;RW Toilet Transfer Details (indicate cue type and reason): BSC over toilet  Toileting- Clothing Manipulation and Hygiene: Maximal assistance;Sit to/from stand;Cueing for back precautions Toileting - Clothing Manipulation Details (indicate cue type and reason): assist for clothing management and perihygiene after small BM      Functional mobility during ADLs: Minimal assistance;Rolling walker General ADL Comments: Pt completed short room level functional mobility using RW with MinA, requires increased time and rest breaks throughout. pt requires redirection as he is eager to have conversation during session completion; re-educated on back precautions as Pt not able to recall these, requires min verbal cues for adhering to back precautions during session                        Cognition Arousal/Alertness: Awake/alert Behavior During Therapy: [facility] for tasks assessed/performed Overall Cognitive  Status: History of cognitive impairments - at baseline                                 General Comments: Alzheimer's dementia (not yet severe); Pt very talkative/eager to have conversation, requires redirection to focus on task at hand                          Pertinent Vitals/ Pain       Pain  Assessment: Faces Faces Pain Scale: Hurts even more Pain Location: incisional Pain Descriptors / Indicators: Operative site guarding Pain Intervention(s): Limited activity within patient's tolerance;Monitored during session;Repositioned                                            Prior Functioning/Environment Level of Independence: Independent  Gait / Transfers Assistance Needed: did not use an AD before ADL's / Homemaking Assistance Needed: independent       Frequency  Min 2X/week        Progress Toward Goals  OT Goals(current goals can now be found in the care plan section)  Progress towards OT goals: Progressing toward goals  Acute Rehab OT Goals Patient Stated Goal: to decrease pain OT Goal Formulation: With patient Time For Goal Achievement: 09/21/16 Potential to Achieve Goals: Good  Plan Discharge plan needs to be updated                    AM-PAC PT "6 Clicks" Daily Activity     Outcome Measure   Help from another person eating meals?: None Help from another person taking care of personal grooming?: A Little Help from another person toileting, which includes using toliet, bedpan, or urinal?: A Lot Help from another person bathing (including washing, rinsing, drying)?: A Lot Help from another person to put on and taking off regular upper body clothing?: A Little Help from another person to put on and taking off regular lower body clothing?: A Lot 6 Click Score: 16    End of Session Equipment Utilized During Treatment: Rolling walker;Back brace  OT Visit Diagnosis: Unsteadiness on feet (R26.81);Muscle weakness (generalized) (M62.81);Other symptoms and signs involving cognitive function;Pain   Activity Tolerance Patient tolerated treatment well   Patient Left in chair;with call bell/phone within reach;with chair alarm set   Nurse Communication Mobility status        Time: 9767-3419 OT Time Calculation (min): 35 min  Charges:  OT General Charges $OT Visit: 1 Visit OT Treatments $Self Care/Home Management : 23-37 mins  Daniel [name], OT Pager [phone] 09/19/2016    Daniel [name] 09/19/2016, 4:15 PM

## 2016-09-19 NOTE — Progress Notes (Signed)
Patient still not able to ambulate with one assist due to pain and poor balance.

## 2016-09-19 NOTE — Progress Notes (Signed)
Inpatient Rehabilitation  Received notification from [facility] that they would like a peer to peer review.  Plan to discuss with medical team tomorrow and update team as soon as I know.  Please call with questions.   Carmelia Roller., CCC/SLP Admission Coordinator  [place]  Cell [phone]

## 2016-09-19 NOTE — Progress Notes (Signed)
Physical Therapy Treatment Patient Details Name: Daniel [name] MRN: [id number] DOB: August 29, 1935 Today's Date: 09/19/2016    History of Present Illness 81 y.o. male admitted on 09/12/16 for lumbar decompression and fusion.  Pt with significant PMH of HTN, DM, Alzheimer's dementia, and L RTC repair.    PT Comments    Pt is progressing slowly, more fatigued today, requiring a seated rest break during gait in the recliner chair.  He is slow to move and gait speed is 0.11 ft/sec well below the high fall risk cut off of 1.8 ft/second.  He remains appropriate for post acute rehab and showed better ability to tolerate longer sessions required of CIR level therapies today.   Follow Up Recommendations  CIR     Equipment Recommendations  Rolling walker with 5" wheels;3in1 (PT)    Recommendations for Other Services   NA     Precautions / Restrictions Precautions Precautions: Fall;Back Precaution Comments: verbally reviewed precautions and log roll technique with pt during session.  Wife not present today Required Braces or Orthoses: Spinal Brace Spinal Brace: Lumbar corset;Applied in sitting position    Mobility  Bed Mobility Overal bed mobility: Needs Assistance Bed Mobility: Rolling;Sidelying to Sit Rolling: Min assist Sidelying to sit: Min assist       General bed mobility comments: Min assist to help pt sequence using manual facilitation log roll to get to EOB and assist at trunk to get to sitting from side lying.   Transfers Overall transfer level: Needs assistance Equipment used: Rolling walker (2 wheeled) Transfers: Sit to/from Stand Sit to Stand: Min assist;From elevated surface         General transfer comment: Heavy min assist from elevated bed with verbal cues to scoot out to prepare for stand and cues for safe hand placement during transitions.  Uncontrolled descent to sit in lower recliner chair.   Ambulation/Gait Ambulation/Gait assistance: Min guard Ambulation  Distance (Feet): 32 Feet (32'x2) Assistive device: Rolling walker (2 wheeled) Gait Pattern/deviations: Step-to pattern;Narrow base of support;Shuffle;Decreased stride length;Trunk flexed Gait velocity: 0.11 ft/sec Gait velocity interpretation: <1.8 ft/sec, indicative of risk for recurrent falls General Gait Details: One person min guard assist for safety, verbal cues for upright posture, closer proximity to RW and longer step length.  I pulled a chair behind [place] as pt needed a seated rest break and then was able to walk again.  Pt with very slow gait speed making him a high fall risk.        Balance Overall balance assessment: Needs assistance Sitting-balance support: Feet supported;No upper extremity supported Sitting balance-Leahy Scale: Fair Sitting balance - Comments: supervision EOB, not challenged     Standing balance-Leahy Scale: Poor Standing balance comment: relies on UE support on RW and support from therapist in standing.                            Cognition Arousal/Alertness: Awake/alert Behavior During Therapy: WFL for tasks assessed/performed Overall Cognitive Status: History of cognitive impairments - at baseline                                 General Comments: Alzheimer's dementia (not yet severe)             Pertinent Vitals/Pain Pain Assessment: Faces Faces Pain Scale: Hurts whole lot Pain Location: incisional Pain Descriptors / Indicators: Operative site guarding Pain Intervention(s): Limited activity  within patient's tolerance;Monitored during session;Repositioned           PT Goals (current goals can now be found in the care plan section) Acute Rehab PT Goals Patient Stated Goal: to decrease pain Progress towards PT goals: Progressing toward goals    Frequency    Min 5X/week      PT Plan Discharge plan needs to be updated       AM-PAC PT "6 Clicks" Daily Activity  Outcome Measure  Difficulty turning over in  bed (including adjusting bedclothes, sheets and blankets)?: Unable Difficulty moving from lying on back to sitting on the side of the bed? : Unable Difficulty sitting down on and standing up from a chair with arms (e.g., wheelchair, bedside commode, etc,.)?: Unable Help needed moving to and from a bed to chair (including a wheelchair)?: A Little Help needed walking in hospital room?: A Little Help needed climbing 3-5 steps with a railing? : A Lot 6 Click Score: 11    End of Session Equipment Utilized During Treatment: Back brace;Gait belt Activity Tolerance: No increased pain;Patient limited by fatigue Patient left: in chair;with call bell/phone within reach;with chair alarm set;with family/visitor present   PT Visit Diagnosis: Muscle weakness (generalized) (M62.81);Difficulty in walking, not elsewhere classified (R26.2);Pain Pain - Right/Left:  (lower) Pain - part of body:  (back)     Time: 1121-1203 PT Time Calculation (min) (ACUTE ONLY): 42 min  Charges:  $Gait Training: 23-37 mins $Therapeutic Activity: 8-22 mins          Daniel [name] B. Overly, Vazquez, DPT [phone]            09/19/2016, 12:51 PM

## 2016-09-19 NOTE — Care Management Note (Signed)
Case Management Note  Patient Details  Name: Daniel [name] MRN: [id number] Date of Birth: 10-Jul-1935  Subjective/Objective:                    Action/Plan: Plan is for CIR vs SNF for rehab. CM following for d/c disposition.   Expected Discharge Date:                  Expected Discharge Plan:  Skilled Nursing Facility  In-House Referral:  Clinical Social Work  Discharge planning Services  CM Consult  Post Acute Care Choice:    Choice offered to:     DME Arranged:    DME Agency:     HH Arranged:    [place] Agency:     Status of Service:  In process, will continue to follow  If discussed at Long Length of Stay Meetings, dates discussed:    Additional Comments:  Pollie Friar, RN 09/19/2016, 11:46 AM

## 2016-09-19 NOTE — Care Management Important Message (Signed)
Important Message  Patient Details  Name: Daniel [name] MRN: [id number] Date of Birth: 11/01/1935   Medicare Important Message Given:  Yes    Dorethea Strubel 09/19/2016, 1:38 PM

## 2016-09-19 NOTE — Progress Notes (Addendum)
Inpatient Diabetes Program Recommendations  AACE/ADA: New Consensus Statement on Inpatient Glycemic Control (2015)  Target Ranges:  Prepandial:   less than 140 mg/dL      Peak postprandial:   less than 180 mg/dL (1-2 hours)      Critically ill patients:  140 - 180 mg/dL   Lab Results  Component Value Date   GLUCAP 189 (H) 09/19/2016   HGBA1C 6.9 (H) 09/06/2016    Review of Glycemic Control  Results for BARBARA, KENG (MRN [id number]) as of 09/19/2016 10:45  Ref. Range 09/18/2016 07:00 09/18/2016 11:28 09/18/2016 16:24 09/18/2016 22:20 09/19/2016 06:14  Glucose-Capillary Latest Ref Range: 65 - 99 mg/dL 183 (H) 170 (H) 209 (H) 216 (H) 189 (H)     Diabetes history: Type 2 diabetes Outpatient Diabetes medications: Metformin 500 mg bid Current orders for Inpatient glycemic control: Novolog moderate tid with meals,  Metformin 500 mg bid  *Decadron 2 mg bid  Inpatient Diabetes Program Recommendations:   While on decadron, please consider adding Novolog meal coverage 2 units tid with meals (hold if patient eats less than 50%).   Gentry Fitz, RN, BA, MHA, CDE Diabetes Coordinator Inpatient Diabetes Program  [phone] (Team Pager) [phone] ([place]) 09/19/2016 10:46 AM

## 2016-09-20 DIAGNOSIS — M4716 Other spondylosis with myelopathy, lumbar region: Secondary | ICD-10-CM | POA: Diagnosis not present

## 2016-09-20 DIAGNOSIS — F0280 Dementia in other diseases classified elsewhere without behavioral disturbance: Secondary | ICD-10-CM | POA: Diagnosis not present

## 2016-09-20 DIAGNOSIS — I10 Essential (primary) hypertension: Secondary | ICD-10-CM | POA: Diagnosis not present

## 2016-09-20 DIAGNOSIS — G309 Alzheimer's disease, unspecified: Secondary | ICD-10-CM | POA: Diagnosis not present

## 2016-09-20 DIAGNOSIS — R2681 Unsteadiness on feet: Secondary | ICD-10-CM | POA: Diagnosis not present

## 2016-09-20 DIAGNOSIS — M549 Dorsalgia, unspecified: Secondary | ICD-10-CM | POA: Diagnosis not present

## 2016-09-20 DIAGNOSIS — M5416 Radiculopathy, lumbar region: Secondary | ICD-10-CM | POA: Diagnosis not present

## 2016-09-20 DIAGNOSIS — Z5189 Encounter for other specified aftercare: Secondary | ICD-10-CM | POA: Diagnosis not present

## 2016-09-20 DIAGNOSIS — M4306 Spondylolysis, lumbar region: Secondary | ICD-10-CM | POA: Diagnosis not present

## 2016-09-20 DIAGNOSIS — R488 Other symbolic dysfunctions: Secondary | ICD-10-CM | POA: Diagnosis not present

## 2016-09-20 DIAGNOSIS — M6281 Muscle weakness (generalized): Secondary | ICD-10-CM | POA: Diagnosis not present

## 2016-09-20 DIAGNOSIS — M48062 Spinal stenosis, lumbar region with neurogenic claudication: Secondary | ICD-10-CM | POA: Diagnosis not present

## 2016-09-20 DIAGNOSIS — M4711 Other spondylosis with myelopathy, occipito-atlanto-axial region: Secondary | ICD-10-CM | POA: Diagnosis not present

## 2016-09-20 DIAGNOSIS — Z9889 Other specified postprocedural states: Secondary | ICD-10-CM | POA: Diagnosis not present

## 2016-09-20 DIAGNOSIS — R278 Other lack of coordination: Secondary | ICD-10-CM | POA: Diagnosis not present

## 2016-09-20 LAB — GLUCOSE, CAPILLARY
GLUCOSE-CAPILLARY: 205 mg/dL — AB (ref 65–99)
Glucose-Capillary: 186 mg/dL — ABNORMAL HIGH (ref 65–99)

## 2016-09-20 MED ORDER — HYDROCODONE-ACETAMINOPHEN 5-325 MG PO TABS: 1.0000 | ORAL_TABLET | ORAL | 0 refills | Status: DC | PRN

## 2016-09-20 MED ORDER — METHOCARBAMOL 500 MG PO TABS: 500.0000 mg | ORAL_TABLET | Freq: Four times a day (QID) | ORAL | 3 refills | Status: DC | PRN

## 2016-09-20 MED ORDER — DEXAMETHASONE 1 MG PO TABS: ORAL_TABLET | ORAL | 0 refills | Status: DC

## 2016-09-20 NOTE — Progress Notes (Signed)
Physical Therapy Treatment Patient Details Name: Daniel [name] MRN: [id number] DOB: 1935/09/18 Today's Date: 09/20/2016    History of Present Illness 81 y.o. male admitted on 09/12/16 for lumbar decompression and fusion.  Pt with significant PMH of HTN, DM, Alzheimer's dementia, and L RTC repair.    PT Comments    Pt progressing well today, seems to be in less pain.  Needs near constant reminders for back precautions and safe RW use.  He was denied CIR admission and would benefit from SNF placement for rehab before returning home with elderly wife.  PT will continue to follow acutely.   Follow Up Recommendations  SNF     Equipment Recommendations  Rolling walker with 5" wheels;3in1 (PT)    Recommendations for Other Services   NA     Precautions / Restrictions Precautions Precautions: Fall;Back Precaution Comments: Verbally reviewed log roll and back precautions, does not seem to be any carryover from session to session.  Required Braces or Orthoses: Spinal Brace Spinal Brace: Lumbar corset;Applied in sitting position    Mobility  Bed Mobility Overal bed mobility: Needs Assistance Bed Mobility: Rolling;Sidelying to Sit Rolling: Min assist Sidelying to sit: Supervision       General bed mobility comments: Min assist to help pt bend knees and reach as a unit to his side for railing to roll.  Once in side lying, verbal cues for sequencing, but only supervision for safety.   Transfers Overall transfer level: Needs assistance Equipment used: Rolling walker (2 wheeled)   Sit to Stand: Min assist         General transfer comment: Min assist to stabilize RW to come to stand, pt unable to push up solely from the bed yet. Verbal cues for safe hand placement for descent and still pt needed min assist to help control descent to sit.   Ambulation/Gait Ambulation/Gait assistance: Supervision Ambulation Distance (Feet): 150 Feet Assistive device: Rolling walker (2 wheeled) Gait  Pattern/deviations: Step-to pattern;Narrow base of support;Shuffle;Decreased stride length;Trunk flexed Gait velocity: 0.76 Gait velocity interpretation: <1.8 ft/sec, indicative of risk for recurrent falls General Gait Details: Close supervision for safety and near constant cues for upright posture and closer proximity to RW.  Gait speed and step length improved today and chair to follow to encouraged increased gait distance.  No seated rest breaks today.           Balance Overall balance assessment: Needs assistance Sitting-balance support: Feet supported;Bilateral upper extremity supported Sitting balance-Leahy Scale: Fair     Standing balance support: Bilateral upper extremity supported;No upper extremity supported;Single extremity supported Standing balance-Leahy Scale: Fair Standing balance comment: Can remove one hand from RW to wave at staff in hallway.                             Cognition Arousal/Alertness: Awake/alert Behavior During Therapy: WFL for tasks assessed/performed Overall Cognitive Status: History of cognitive impairments - at baseline                                 General Comments: Alzheimer's dementia (not yet severe); Pt very talkative/eager to have conversation, requires redirection to focus on task at hand             Pertinent Vitals/Pain Pain Assessment: 0-10 Pain Score: 4  Pain Location: incisional Pain Descriptors / Indicators: Aching;Burning;Grimacing;Guarding Pain Intervention(s): Limited activity within patient's tolerance;Monitored  during session;Repositioned           PT Goals (current goals can now be found in the care plan section) Acute Rehab PT Goals Patient Stated Goal: to decrease pain Progress towards PT goals: Progressing toward goals    Frequency    Min 5X/week      PT Plan Discharge plan needs to be updated (CIR was denied)       AM-PAC PT "6 Clicks" Daily Activity  Outcome Measure   Difficulty turning over in bed (including adjusting bedclothes, sheets and blankets)?: Unable Difficulty moving from lying on back to sitting on the side of the bed? : Unable Difficulty sitting down on and standing up from a chair with arms (e.g., wheelchair, bedside commode, etc,.)?: Unable Help needed moving to and from a bed to chair (including a wheelchair)?: A Little Help needed walking in hospital room?: A Little Help needed climbing 3-5 steps with a railing? : A Lot 6 Click Score: 11    End of Session Equipment Utilized During Treatment: Back brace;Gait belt Activity Tolerance: Patient limited by fatigue;Patient limited by pain Patient left: in chair;with call bell/phone within reach;with chair alarm set   PT Visit Diagnosis: Muscle weakness (generalized) (M62.81);Difficulty in walking, not elsewhere classified (R26.2);Pain Pain - Right/Left:  (lower) Pain - part of body:  (back)     Time: 1791-5056 PT Time Calculation (min) (ACUTE ONLY): 35 min  Charges:  $Gait Training: 23-37 mins          Jairy Angulo B. [place], [place], DPT [phone]            09/20/2016, 11:44 AM

## 2016-09-20 NOTE — Consult Note (Signed)
           [facility] CM Primary Care Navigator  09/20/2016  Daniel [name] 09-Dec-1935 [id number]   Went to see patient at the bedside to identify possible discharge needs. Patient reports having history of back pains- increasingly getting worse and was scheduled for back surgery after conservative management failed which hadled to this admission/ surgery.  Patient endorses [facility].Walter Pharr with [facility] as hisprimary care provider.  Patient has been using [facility] on  [address] to obtain medications without difficulty.   Patient has been managinghis own medications at home with use of "pill box" system filled weekly.  Patient reports he has been driving up until this admission. Wife ([name]) or son [name]) will be providing transportation to hisdoctors'appointments after discharge.  Patient has been independent prior to admission/ surgery. Wife and son will be his primary caregivers at home as stated.  Per Inpatient CM note, discharge disposition/ plan is for [facility] Inpatient Rehab (CIR) vs. SNF (skilled nursing facility) for rehabilitation.  Patient voiced understanding to call primary care provider's officewhen hereturns backhome,for a post discharge follow-up within a week or sooner if needs arise.Patient letter (with PCP's contact number) was provided as areminder.   Discussed with patient regarding [facility] CM services available for health managementat home but he communicated no needs or concerns at this present time. He states that diabetes is managed well at home with monitoring/ recording of blood sugar, diet, medication, exercise and follow-up with primary care provider when needed.  Patient expressed understanding to seekreferral from primary care provider to [facility] care management asdeemed necessary and appropriatefor services in the future.  [facility] care management information was provided for future needs that he may  have   For questions, please contact:  Dannielle Huh, BSN, RN- [facility] Primary Care Navigator  Telephone: [phone] [place]

## 2016-09-20 NOTE — Progress Notes (Signed)
Pt being discharged to [facility] per orders from MD. Pt educated and aware of discharge. Pt verbalized understanding. Pt's IV was removed prior to discharge. Pt exited hospital via stretcher. RN attempted to call [facility] for report twice with no answer from RN.

## 2016-09-20 NOTE — Progress Notes (Signed)
CSW following to facilitate discharge to SNF. CSW notified by Rehab Admissions Coordinator that patient was not receiving insurance approval for CIR; will need SNF. CSW has initiated insurance authorization for SNF at [place]; will follow up when received.  Laveda Abbe, [place] Clinical Social Worker [phone]

## 2016-09-20 NOTE — Progress Notes (Signed)
Inpatient Rehabilitation  Dr. Posey Pronto has reviewed the case and does not feel that a peer to peer is warranted. As a result, the denial is upheld.  I have updated spouse and case Freight forwarder.  Will sign off at this time.  Call if questions.   Carmelia Roller., CCC/SLP Admission Coordinator  [place]  Cell [phone]

## 2016-09-20 NOTE — Clinical Social Work Placement (Signed)
   CLINICAL SOCIAL WORK PLACEMENT  NOTE  Date:  09/20/2016  Patient Details  Name: Daniel [name] MRN: [id number] Date of Birth: 05/13/1935  Clinical Social Work is seeking post-discharge placement for this patient at the [place] level of care (*CSW will initial, date and re-position this form in  chart as items are completed):  Yes   Patient/family provided with [place] Work Department's list of facilities offering this level of care within the geographic area requested by the patient (or if unable, by the patient's family).  Yes   Patient/family informed of their freedom to choose among providers that offer the needed level of care, that participate in Medicare, Medicaid or managed care program needed by the patient, have an available bed and are willing to accept the patient.  Yes   Patient/family informed of [place]'s ownership interest in [facility] and [facility], as well as of the fact that they are under no obligation to receive care at these facilities.  PASRR submitted to EDS on       PASRR number received on       Existing PASRR number confirmed on       FL2 transmitted to all facilities in geographic area requested by pt/family on       FL2 transmitted to all facilities within larger geographic area on 09/14/16     Patient informed that his/her managed care company has contracts with or will negotiate with certain facilities, including the following:            Patient/family informed of bed offers received.  Patient chooses bed at [facility]     Physician recommends and patient chooses bed at      Patient to be transferred to [facility] on 09/20/16.  Patient to be transferred to facility by [facility]     Patient family notified on 09/20/16 of transfer.  Name of family member notified:  Flo Shanks     PHYSICIAN       Additional Comment:    _______________________________________________ Geralynn Ochs, LCSW 09/20/2016, 3:55 PM

## 2016-09-20 NOTE — Care Management Note (Signed)
Case Management Note  Patient Details  Name: Daniel [name] MRN: [id number] Date of Birth: 04-16-1935  Subjective/Objective:                    Action/Plan: Pt discharging to [facility] today. No further needs per CM.   Expected Discharge Date:  09/20/16               Expected Discharge Plan:  Skilled Nursing Facility  In-House Referral:  Clinical Social Work  Discharge planning Services  CM Consult  Post Acute Care Choice:    Choice offered to:     DME Arranged:    DME Agency:     HH Arranged:    [place] Agency:     Status of Service:  Completed, signed off  If discussed at [employer], dates discussed:    Additional Comments:  Pollie Friar, RN 09/20/2016, 11:58 AM

## 2016-09-20 NOTE — Progress Notes (Signed)
Discharge to: [facility] Anticipated discharge date: 09/20/16 Family notified: Yes, at bedside Transportation by: [facility]  Report #: [phone], Room 307P, ask for [facility] signing off.  Laveda Abbe LCSW [phone]

## 2016-09-22 DIAGNOSIS — I10 Essential (primary) hypertension: Secondary | ICD-10-CM | POA: Diagnosis not present

## 2016-09-22 DIAGNOSIS — F0280 Dementia in other diseases classified elsewhere without behavioral disturbance: Secondary | ICD-10-CM | POA: Diagnosis not present

## 2016-09-22 DIAGNOSIS — Z9889 Other specified postprocedural states: Secondary | ICD-10-CM | POA: Diagnosis not present

## 2016-09-22 DIAGNOSIS — G309 Alzheimer's disease, unspecified: Secondary | ICD-10-CM | POA: Diagnosis not present

## 2016-10-10 ENCOUNTER — Telehealth: Payer: Self-pay | Admitting: Neurology

## 2016-10-10 NOTE — Telephone Encounter (Signed)
I have spoken with [name]. She had to r/s pt's appt. with [facility] tomorrow. Pt. had back surgery 09/12/16.  There were some complications, and pt. is having to complete rehab at [facility]. Appt. has been r/s for 11/16/16 at 3pm. She sts. pt. is doing well on Exelon patch and wanted to make sure pt. can continue Exelon until f/u appt., and I have advised he can/fim

## 2016-10-10 NOTE — Telephone Encounter (Signed)
Patients wife called office requesting to speak with Faith.  Did not give details.  Please call.

## 2016-10-11 ENCOUNTER — Ambulatory Visit: Payer: PPO | Admitting: Neurology

## 2016-10-23 DIAGNOSIS — M48062 Spinal stenosis, lumbar region with neurogenic claudication: Secondary | ICD-10-CM | POA: Diagnosis not present

## 2016-10-23 DIAGNOSIS — M6281 Muscle weakness (generalized): Secondary | ICD-10-CM | POA: Diagnosis not present

## 2016-10-25 DIAGNOSIS — F0280 Dementia in other diseases classified elsewhere without behavioral disturbance: Secondary | ICD-10-CM | POA: Diagnosis not present

## 2016-10-25 DIAGNOSIS — G309 Alzheimer's disease, unspecified: Secondary | ICD-10-CM | POA: Diagnosis not present

## 2016-10-25 DIAGNOSIS — I10 Essential (primary) hypertension: Secondary | ICD-10-CM | POA: Diagnosis not present

## 2016-10-25 DIAGNOSIS — Z9889 Other specified postprocedural states: Secondary | ICD-10-CM | POA: Diagnosis not present

## 2016-10-25 DIAGNOSIS — M4316 Spondylolisthesis, lumbar region: Secondary | ICD-10-CM | POA: Diagnosis not present

## 2016-10-31 DIAGNOSIS — Z9181 History of falling: Secondary | ICD-10-CM | POA: Diagnosis not present

## 2016-10-31 DIAGNOSIS — I10 Essential (primary) hypertension: Secondary | ICD-10-CM | POA: Diagnosis not present

## 2016-10-31 DIAGNOSIS — M48061 Spinal stenosis, lumbar region without neurogenic claudication: Secondary | ICD-10-CM | POA: Diagnosis not present

## 2016-10-31 DIAGNOSIS — F0280 Dementia in other diseases classified elsewhere without behavioral disturbance: Secondary | ICD-10-CM | POA: Diagnosis not present

## 2016-10-31 DIAGNOSIS — G309 Alzheimer's disease, unspecified: Secondary | ICD-10-CM | POA: Diagnosis not present

## 2016-10-31 DIAGNOSIS — Z7984 Long term (current) use of oral hypoglycemic drugs: Secondary | ICD-10-CM | POA: Diagnosis not present

## 2016-10-31 DIAGNOSIS — K219 Gastro-esophageal reflux disease without esophagitis: Secondary | ICD-10-CM | POA: Diagnosis not present

## 2016-10-31 DIAGNOSIS — E119 Type 2 diabetes mellitus without complications: Secondary | ICD-10-CM | POA: Diagnosis not present

## 2016-10-31 DIAGNOSIS — Z4789 Encounter for other orthopedic aftercare: Secondary | ICD-10-CM | POA: Diagnosis not present

## 2016-10-31 DIAGNOSIS — M1990 Unspecified osteoarthritis, unspecified site: Secondary | ICD-10-CM | POA: Diagnosis not present

## 2016-10-31 DIAGNOSIS — M4726 Other spondylosis with radiculopathy, lumbar region: Secondary | ICD-10-CM | POA: Diagnosis not present

## 2016-10-31 DIAGNOSIS — E785 Hyperlipidemia, unspecified: Secondary | ICD-10-CM | POA: Diagnosis not present

## 2016-11-16 ENCOUNTER — Encounter: Payer: Self-pay | Admitting: Neurology

## 2016-11-16 ENCOUNTER — Ambulatory Visit (INDEPENDENT_AMBULATORY_CARE_PROVIDER_SITE_OTHER): Payer: PPO | Admitting: Neurology

## 2016-11-16 VITALS — BP 133/81 | HR 80 | Resp 20 | Ht 72.0 in | Wt 155.0 lb

## 2016-11-16 DIAGNOSIS — M4316 Spondylolisthesis, lumbar region: Secondary | ICD-10-CM | POA: Diagnosis not present

## 2016-11-16 DIAGNOSIS — G301 Alzheimer's disease with late onset: Secondary | ICD-10-CM

## 2016-11-16 DIAGNOSIS — F0280 Dementia in other diseases classified elsewhere without behavioral disturbance: Secondary | ICD-10-CM | POA: Diagnosis not present

## 2016-11-16 DIAGNOSIS — R350 Frequency of micturition: Secondary | ICD-10-CM | POA: Insufficient documentation

## 2016-11-16 NOTE — Progress Notes (Signed)
[facility]  PATIENT: Daniel [name] DOB: Dec 27, 1935  REFERRING DOCTOR OR PCP:  Deland Pretty SOURCE: patient, notes from Dr. Shelia Media, labs, imaging reports, CT image  _________________________________   HISTORICAL  CHIEF COMPLAINT:  Chief Complaint  Patient presents with  . Memory Loss    Sts. gi issues improved with the switch from Aricept to Exelon.  He had recent back surgery; wife feels memory is some worse since then--attributes this to anesthesia/fim    HISTORY OF PRESENT ILLNESS:  Daniel [name] is a 81 yo man with memory loss.  Update 11/16/2016:   Memory worsened after his spinal surgery (was under x 6 hours, reportedly) but has slowly returned practically back to the baseline before surgery.Marland Kitchen   He tolerated Exelon patches better than the Aricept.    He has not driven since the surgery.     He has trouble with recalling names and has decreased focus and attention.  He had fusion L3-L5 and he needed dural repair.   He reports a headache, especially in the right neck.     The headache is most notable when he is sitting up a long time.       He falls asleep easily but wakes up a few times to urinate every night.    He does not have loud snoring    There are no REM behavior disorder symptoms.    From 06/08/2016:  At the initial visit, donepezil was started and he is doing better going from a MoCA score of 26 to 28.    He has had a lot of diarrhea.  This is inconsistent but occurs at least a few times a week and he has had fecal incontinence.        He often has feelings of Deja vu when they are out of the house or when watching TV.     As an example, they were looking at a map of [place] and he swore he had been there even though he had not.    He will be having lumbar surgery at L3-L5 soon by Dr. Ellene Route.   He sleeps well most nights.   He snores There are no witnessed gasps/snorts or pauses.      He does not have REM behavior disorder symptoms.  He  drives and sometimes misses turns but has never gotten lost.  He denies depression but is sometimes irritable.   He is diabetic and has mild diabetic polyneuropathy, helped by Lyrica.    He used to be very active and did a lot of dancing and ran 10K.        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   He saw Dr. Jannifer Franklin and had an evaluation with MMSE = 29.  The MRI brain was normal.   He did not worsen but still noted some difficulty at times.Marland Kitchen  He continued  to work as an Optometrist.  A few years later he started to notice that memory was becoming more of a problem again.  He sold hs practice in 2014 because he felt there was too high of a chance that he would make errors.Marland Kitchen   He feels memory issues mildly worsened slowly over time since then..  I have reviewed the CT scan from 06/27/2015 and compared it with the MRI from 09/05/2004. The CT scan shows moderate cortical atrophy, probably more pronounced in the mesial temporal lobes. There also appears to be some chronic microvascular ischemic change. The atrophy has progressed since the MRI from 2006.   At his initial visit in early 2018, the MoCA score was 26/30.    Follow-up 06/04/2016 showed improved scores of 28/30. However, he was experiencing a lot of diarrhea. Donepezil was switched to Exelon.   His great grandmother had dementia in her late 46's.  His mother had dementia that started in her 46's and died of a stroke, 2 aunts had severe  dementia.   An uncle also had dementia (mild).  A second uncle had very mild dementia (late 67's) but died of unrelatred issues a year later.       REVIEW OF SYSTEMS: Constitutional: No fevers, chills, sweats, or change in appetite Eyes: No visual changes, double vision, eye pain Ear, nose and throat: No hearing loss, ear pain, nasal congestion, sore throat Cardiovascular: No chest pain, palpitations Respiratory: No shortness of breath at rest or with exertion.   No wheezes GastrointestinaI: No nausea, vomiting, diarrhea, abdominal pain, fecal incontinence Genitourinary: No dysuria, urinary retention or frequency.  No nocturia. Musculoskeletal: LBP, recent PLIF L3-L5 Integumentary: No rash, pruritus, skin lesions Neurological: as above Psychiatric: No depression at this tim\\\\\\\\\\\\\\\\\\\\\\\\\\\\\\\\\\\\\\\\\\\\\\\\\\\\e.  No anxiety Endocrine: He has DM Type 2.  No palpitations, diaphoresis, change in appetite, change in weigh or increased thirst Hematologic/Lymphatic: No anemia, purpura, petechiae. Allergic/Immunologic: No itchy/runny eyes, nasal congestion, recent allergic reactions, rashes  ALLERGIES: Allergies  Allergen Reactions  . Donepezil Diarrhea    HOME MEDICATIONS:  Current Outpatient Prescriptions:  .  Alpha Lipoic Acid 200 MG CAPS, Take 1 capsule by mouth daily., Disp: , Rfl:  .  atorvastatin (LIPITOR) 40 MG tablet, Take 40 mg by mouth every morning. , Disp: , Rfl:  .  celecoxib (CELEBREX) 200 MG capsule, Take 200 mg by mouth every morning. , Disp: , Rfl:  .  Cholecalciferol (CVS D3) 2000 units CAPS, Take 1 capsule by mouth every morning. , Disp: , Rfl:  .  dexamethasone (DECADRON) 1 MG tablet, 2 tablets twice daily for 2 days, one tablet twice daily for 2 days, one tablet daily for 2 days., Disp: 15 tablet, Rfl: 0 .  ezetimibe (ZETIA) 10 MG tablet, Take 10 mg by mouth every evening. , Disp: , Rfl: 3 .  HYDROcodone-acetaminophen (NORCO/VICODIN) 5-325 MG  tablet, Take 1-2 tablets by mouth every 4 (four) hours as needed (breakthrough pain)., Disp: 60 tablet, Rfl: 0 .  LYRICA 50 MG capsule, Take 50 mg by mouth 2 (two) times daily. , Disp: , Rfl: 3 .  metFORMIN (GLUCOPHAGE) 500 MG tablet, Take 500 mg by mouth 2 (two) times daily., Disp: , Rfl: 2 .  methocarbamol (ROBAXIN) 500 MG tablet, Take 1 tablet (500 mg total) by mouth every 6 (six) hours as needed for muscle spasms., Disp: 40 tablet, Rfl: 3 .  Multiple Minerals-Vitamins (CALCIUM-MAGNESIUM-ZINC-D3) TABS, Take 1 tablet by mouth every morning. , Disp: , Rfl:  .  Multiple Vitamin (MULTIVITAMIN WITH MINERALS) TABS, Take 1 tablet by mouth daily., Disp: , Rfl:  .  pantoprazole (PROTONIX) 40 MG tablet, Take 40 mg by mouth every morning. , Disp: , Rfl:  .  ramipril (ALTACE) 2.5 MG capsule, Take 2.5 mg by mouth every morning. , Disp: , Rfl:  .  rivastigmine (EXELON) 9.5 mg/24hr, Place 1 patch (9.5 mg total) onto the skin daily., Disp: 30 patch, Rfl: 11  PAST MEDICAL HISTORY: Past Medical History:  Diagnosis Date  . Alzheimer disease   . Arthritis   . Cancer (HCC)    Skin  . Diabetes mellitus without complication ([place])   . GERD (gastroesophageal reflux disease)   . Hyperlipidemia   . Hypertension     PAST SURGICAL HISTORY: Past Surgical History:  Procedure Laterality Date  . BACK SURGERY    . CATARACT EXTRACTION Bilateral 1987 89  . ROTATOR CUFF REPAIR Left 2003    FAMILY HISTORY: Family History  Problem Relation Age of Onset  . Alzheimer's disease Mother   . Alzheimer's disease Maternal Grandfather     SOCIAL HISTORY:  Social History   Social History  . Marital status: Married    Spouse name: N/A  . Number of children: N/A  . Years of education: N/A   Occupational History  . Not on file.   Social History Main Topics  . Smoking status: Former Smoker    Quit date: 01/16/1978  . Smokeless tobacco: Never Used  . Alcohol use Yes     Comment: occasionally  . Drug use: No  .  Sexual activity: Not on file   Other Topics Concern  . Not on file   Social History Narrative  . No narrative on file     PHYSICAL EXAM  Vitals:   11/16/16 1507  BP: 133/81  Pulse: 80  Resp: 20  Weight: 155 lb (70.3 kg)  Height: 6' (1.829 m)    Body mass index is 21.02 kg/m.   General: The patient is well-developed and well-nourished and in no acute distress   Neurologic Exam  Mental status: The patient is alert and oriented x 2 1/2 at the time of the examination (wrong date). The patient has apparent normal remote memory but reduced short-term memory requiring prompts (1/3 without and 3/3 with category prompt0.   He has reduced attention span and concentration ability (DLROW; 100-93-96?) .   Speech is normal.  Cranial nerves: Extraocular movements are full. Facial strength and sensation is normal. Trapezius and sternocleidomastoid strength is normal. No dysarthria is noted.  The tongue is midline, and the patient has symmetric elevation of the soft palate. No obvious hearing deficits are noted.  Motor:  He has a very mild 6-7 hz tremor in his right hand only.   Muscle bulk is normal.   Tone is normal. Strength is  5 / 5 in all 4 extremities.   Sensory: He had intact sensation to touch temperature and vibration in the hands and arms and proximal legs. He has reduced vibration sensation at the ankles.  Coordination: Cerebellar testing reveals good finger-nose-finger and heel-to-shin bilaterally.  Gait and station: Station is normal.   Gait is ok but right arm swing is reduced on the right . Tandem gait is wide.    Romberg is negative.   Reflexes: Deep tendon reflexes are symmetric and normal bilaterally.       DIAGNOSTIC DATA (LABS, IMAGING, TESTING) - I reviewed patient records, labs, notes, testing and imaging myself where available.  Lab Results  Component Value Date   WBC 15.1 (H) 09/14/2016   HGB 10.2 (L) 09/14/2016   HCT 29.9 (L) 09/14/2016   MCV 94.0  09/14/2016   PLT 134 (L) 09/14/2016      Component Value Date/Time   NA 139 09/14/2016 0524   K 3.7 09/14/2016 0524   CL 105 09/14/2016 0524   CO2 26 09/14/2016 0524   GLUCOSE 179 (H) 09/14/2016 0524   BUN 17 09/14/2016 0524   CREATININE 1.09 09/14/2016 0524   CALCIUM 8.4 (L) 09/14/2016 0524   PROT 7.2 01/01/2012 1405   ALBUMIN 4.2 01/01/2012 1405   AST 71 (H) 01/01/2012 1405   ALT 83 (H) 01/01/2012 1405   ALKPHOS 71 01/01/2012 1405   BILITOT 0.6 01/01/2012 1405   GFRNONAA >60 09/14/2016 0524   GFRAA >60 09/14/2016 0524       ASSESSMENT AND PLAN  Late onset Alzheimer's disease without behavioral disturbance  Spondylolisthesis of lumbar region  Urinary frequency   1.   Continue Exelon patches. 2.    Continue to stay active and exercises tolerated. 3.    Return in 12 months or sooner if there are new or worsening neurologic symptoms.  Maleik Vanderzee A. Felecia Shelling, MD, PhD 16/01/958, 4:54 PM Certified in Neurology, Clinical Neurophysiology, Sleep Medicine, Pain Medicine and Neuroimaging  [facility] [address], [place], [place] [address] [phone]

## 2016-11-30 DIAGNOSIS — M4316 Spondylolisthesis, lumbar region: Secondary | ICD-10-CM | POA: Diagnosis not present

## 2016-12-01 ENCOUNTER — Other Ambulatory Visit: Payer: Self-pay | Admitting: Neurological Surgery

## 2016-12-01 DIAGNOSIS — G9608 Other cranial cerebrospinal fluid leak: Secondary | ICD-10-CM

## 2016-12-01 DIAGNOSIS — G960 Cerebrospinal fluid leak: Principal | ICD-10-CM

## 2016-12-05 ENCOUNTER — Ambulatory Visit
Admission: RE | Admit: 2016-12-05 | Discharge: 2016-12-05 | Disposition: A | Discharge status: Home / Self Care | Payer: PPO | Source: Ambulatory Visit | Attending: Neurological Surgery | Admitting: Neurological Surgery

## 2016-12-05 DIAGNOSIS — G9608 Other cranial cerebrospinal fluid leak: Secondary | ICD-10-CM

## 2016-12-05 DIAGNOSIS — R51 Headache: Secondary | ICD-10-CM | POA: Diagnosis not present

## 2016-12-05 DIAGNOSIS — G960 Cerebrospinal fluid leak: Principal | ICD-10-CM

## 2016-12-06 DIAGNOSIS — M4726 Other spondylosis with radiculopathy, lumbar region: Secondary | ICD-10-CM | POA: Diagnosis not present

## 2016-12-06 DIAGNOSIS — Z4789 Encounter for other orthopedic aftercare: Secondary | ICD-10-CM | POA: Diagnosis not present

## 2016-12-06 DIAGNOSIS — E119 Type 2 diabetes mellitus without complications: Secondary | ICD-10-CM | POA: Diagnosis not present

## 2017-01-24 DIAGNOSIS — K589 Irritable bowel syndrome without diarrhea: Secondary | ICD-10-CM | POA: Diagnosis not present

## 2017-01-24 DIAGNOSIS — K219 Gastro-esophageal reflux disease without esophagitis: Secondary | ICD-10-CM | POA: Diagnosis not present

## 2017-02-03 ENCOUNTER — Other Ambulatory Visit: Payer: Self-pay | Admitting: Neurological Surgery

## 2017-02-03 DIAGNOSIS — S065X9A Traumatic subdural hemorrhage with loss of consciousness of unspecified duration, initial encounter: Secondary | ICD-10-CM

## 2017-02-03 DIAGNOSIS — S065XAA Traumatic subdural hemorrhage with loss of consciousness status unknown, initial encounter: Secondary | ICD-10-CM

## 2017-02-07 ENCOUNTER — Ambulatory Visit
Admission: RE | Admit: 2017-02-07 | Discharge: 2017-02-07 | Disposition: A | Discharge status: Home / Self Care | Payer: PPO | Source: Ambulatory Visit | Attending: Neurological Surgery | Admitting: Neurological Surgery

## 2017-02-07 DIAGNOSIS — S065X9A Traumatic subdural hemorrhage with loss of consciousness of unspecified duration, initial encounter: Secondary | ICD-10-CM

## 2017-02-07 DIAGNOSIS — S065XAA Traumatic subdural hemorrhage with loss of consciousness status unknown, initial encounter: Secondary | ICD-10-CM

## 2017-02-07 DIAGNOSIS — S065X0A Traumatic subdural hemorrhage without loss of consciousness, initial encounter: Secondary | ICD-10-CM | POA: Diagnosis not present

## 2017-03-01 DIAGNOSIS — G960 Cerebrospinal fluid leak: Secondary | ICD-10-CM | POA: Diagnosis not present

## 2017-03-06 ENCOUNTER — Inpatient Hospital Stay (HOSPITAL_COMMUNITY): Payer: PPO

## 2017-03-06 ENCOUNTER — Inpatient Hospital Stay (HOSPITAL_COMMUNITY)
Admission: EM | Admit: 2017-03-06 | Discharge: 2017-03-13 | DRG: 026 | Disposition: A | Discharge status: Rehabilitation Facility | Payer: PPO | Attending: Neurological Surgery | Admitting: Neurological Surgery

## 2017-03-06 ENCOUNTER — Encounter (HOSPITAL_COMMUNITY): Payer: Self-pay

## 2017-03-06 ENCOUNTER — Other Ambulatory Visit: Payer: Self-pay | Admitting: Neurological Surgery

## 2017-03-06 DIAGNOSIS — K5900 Constipation, unspecified: Secondary | ICD-10-CM | POA: Diagnosis not present

## 2017-03-06 DIAGNOSIS — I6203 Nontraumatic chronic subdural hemorrhage: Principal | ICD-10-CM | POA: Diagnosis present

## 2017-03-06 DIAGNOSIS — Z888 Allergy status to other drugs, medicaments and biological substances status: Secondary | ICD-10-CM | POA: Diagnosis not present

## 2017-03-06 DIAGNOSIS — Z87891 Personal history of nicotine dependence: Secondary | ICD-10-CM

## 2017-03-06 DIAGNOSIS — R0682 Tachypnea, not elsewhere classified: Secondary | ICD-10-CM

## 2017-03-06 DIAGNOSIS — D62 Acute posthemorrhagic anemia: Secondary | ICD-10-CM | POA: Diagnosis not present

## 2017-03-06 DIAGNOSIS — R269 Unspecified abnormalities of gait and mobility: Secondary | ICD-10-CM | POA: Diagnosis not present

## 2017-03-06 DIAGNOSIS — G9608 Other cranial cerebrospinal fluid leak: Secondary | ICD-10-CM

## 2017-03-06 DIAGNOSIS — G8929 Other chronic pain: Secondary | ICD-10-CM | POA: Diagnosis present

## 2017-03-06 DIAGNOSIS — I6201 Nontraumatic acute subdural hemorrhage: Secondary | ICD-10-CM | POA: Diagnosis not present

## 2017-03-06 DIAGNOSIS — Z9842 Cataract extraction status, left eye: Secondary | ICD-10-CM

## 2017-03-06 DIAGNOSIS — G9340 Encephalopathy, unspecified: Secondary | ICD-10-CM | POA: Diagnosis present

## 2017-03-06 DIAGNOSIS — Z298 Encounter for other specified prophylactic measures: Secondary | ICD-10-CM | POA: Diagnosis not present

## 2017-03-06 DIAGNOSIS — F0390 Unspecified dementia without behavioral disturbance: Secondary | ICD-10-CM | POA: Diagnosis not present

## 2017-03-06 DIAGNOSIS — K219 Gastro-esophageal reflux disease without esophagitis: Secondary | ICD-10-CM | POA: Diagnosis present

## 2017-03-06 DIAGNOSIS — Z7984 Long term (current) use of oral hypoglycemic drugs: Secondary | ICD-10-CM

## 2017-03-06 DIAGNOSIS — R0989 Other specified symptoms and signs involving the circulatory and respiratory systems: Secondary | ICD-10-CM | POA: Diagnosis not present

## 2017-03-06 DIAGNOSIS — R262 Difficulty in walking, not elsewhere classified: Secondary | ICD-10-CM | POA: Diagnosis not present

## 2017-03-06 DIAGNOSIS — R739 Hyperglycemia, unspecified: Secondary | ICD-10-CM

## 2017-03-06 DIAGNOSIS — G309 Alzheimer's disease, unspecified: Secondary | ICD-10-CM | POA: Diagnosis present

## 2017-03-06 DIAGNOSIS — K5901 Slow transit constipation: Secondary | ICD-10-CM | POA: Diagnosis not present

## 2017-03-06 DIAGNOSIS — I10 Essential (primary) hypertension: Secondary | ICD-10-CM | POA: Diagnosis present

## 2017-03-06 DIAGNOSIS — E440 Moderate protein-calorie malnutrition: Secondary | ICD-10-CM | POA: Diagnosis not present

## 2017-03-06 DIAGNOSIS — R32 Unspecified urinary incontinence: Secondary | ICD-10-CM | POA: Diagnosis not present

## 2017-03-06 DIAGNOSIS — E119 Type 2 diabetes mellitus without complications: Secondary | ICD-10-CM

## 2017-03-06 DIAGNOSIS — G960 Cerebrospinal fluid leak: Secondary | ICD-10-CM | POA: Diagnosis present

## 2017-03-06 DIAGNOSIS — Z9841 Cataract extraction status, right eye: Secondary | ICD-10-CM | POA: Diagnosis not present

## 2017-03-06 DIAGNOSIS — E559 Vitamin D deficiency, unspecified: Secondary | ICD-10-CM | POA: Diagnosis present

## 2017-03-06 DIAGNOSIS — E1140 Type 2 diabetes mellitus with diabetic neuropathy, unspecified: Secondary | ICD-10-CM | POA: Diagnosis present

## 2017-03-06 DIAGNOSIS — E785 Hyperlipidemia, unspecified: Secondary | ICD-10-CM | POA: Diagnosis present

## 2017-03-06 DIAGNOSIS — F0280 Dementia in other diseases classified elsewhere without behavioral disturbance: Secondary | ICD-10-CM | POA: Diagnosis not present

## 2017-03-06 DIAGNOSIS — R7309 Other abnormal glucose: Secondary | ICD-10-CM | POA: Diagnosis not present

## 2017-03-06 DIAGNOSIS — Z820 Family history of epilepsy and other diseases of the nervous system: Secondary | ICD-10-CM

## 2017-03-06 DIAGNOSIS — M549 Dorsalgia, unspecified: Secondary | ICD-10-CM | POA: Diagnosis present

## 2017-03-06 DIAGNOSIS — E8809 Other disorders of plasma-protein metabolism, not elsewhere classified: Secondary | ICD-10-CM | POA: Diagnosis not present

## 2017-03-06 DIAGNOSIS — M1990 Unspecified osteoarthritis, unspecified site: Secondary | ICD-10-CM | POA: Diagnosis not present

## 2017-03-06 DIAGNOSIS — T380X5A Adverse effect of glucocorticoids and synthetic analogues, initial encounter: Secondary | ICD-10-CM | POA: Diagnosis present

## 2017-03-06 DIAGNOSIS — M6281 Muscle weakness (generalized): Secondary | ICD-10-CM | POA: Diagnosis not present

## 2017-03-06 DIAGNOSIS — Z48811 Encounter for surgical aftercare following surgery on the nervous system: Secondary | ICD-10-CM | POA: Diagnosis not present

## 2017-03-06 DIAGNOSIS — Z85828 Personal history of other malignant neoplasm of skin: Secondary | ICD-10-CM

## 2017-03-06 DIAGNOSIS — L89322 Pressure ulcer of left buttock, stage 2: Secondary | ICD-10-CM | POA: Diagnosis not present

## 2017-03-06 DIAGNOSIS — R7989 Other specified abnormal findings of blood chemistry: Secondary | ICD-10-CM | POA: Diagnosis not present

## 2017-03-06 DIAGNOSIS — S065X9A Traumatic subdural hemorrhage with loss of consciousness of unspecified duration, initial encounter: Secondary | ICD-10-CM | POA: Diagnosis not present

## 2017-03-06 DIAGNOSIS — I6200 Nontraumatic subdural hemorrhage, unspecified: Secondary | ICD-10-CM | POA: Diagnosis not present

## 2017-03-06 DIAGNOSIS — Z6820 Body mass index (BMI) 20.0-20.9, adult: Secondary | ICD-10-CM

## 2017-03-06 DIAGNOSIS — I69298 Other sequelae of other nontraumatic intracranial hemorrhage: Secondary | ICD-10-CM | POA: Diagnosis not present

## 2017-03-06 DIAGNOSIS — G9389 Other specified disorders of brain: Secondary | ICD-10-CM | POA: Diagnosis not present

## 2017-03-06 DIAGNOSIS — D72829 Elevated white blood cell count, unspecified: Secondary | ICD-10-CM | POA: Diagnosis not present

## 2017-03-06 DIAGNOSIS — Z91048 Other nonmedicinal substance allergy status: Secondary | ICD-10-CM

## 2017-03-06 DIAGNOSIS — E1142 Type 2 diabetes mellitus with diabetic polyneuropathy: Secondary | ICD-10-CM | POA: Diagnosis not present

## 2017-03-06 DIAGNOSIS — G9349 Other encephalopathy: Secondary | ICD-10-CM | POA: Diagnosis not present

## 2017-03-06 DIAGNOSIS — E1165 Type 2 diabetes mellitus with hyperglycemia: Secondary | ICD-10-CM | POA: Diagnosis present

## 2017-03-06 LAB — CBC WITH DIFFERENTIAL/PLATELET
Basophils Absolute: 0 10*3/uL (ref 0.0–0.1)
Basophils Relative: 1 %
Eosinophils Absolute: 0 10*3/uL (ref 0.0–0.7)
Eosinophils Relative: 0 %
HCT: 42.6 % (ref 39.0–52.0)
Hemoglobin: 13.9 g/dL (ref 13.0–17.0)
Lymphocytes Relative: 17 %
Lymphs Abs: 1.3 10*3/uL (ref 0.7–4.0)
MCH: 28.6 pg (ref 26.0–34.0)
MCHC: 32.6 g/dL (ref 30.0–36.0)
MCV: 87.7 fL (ref 78.0–100.0)
Monocytes Absolute: 0.4 10*3/uL (ref 0.1–1.0)
Monocytes Relative: 5 %
Neutro Abs: 5.8 10*3/uL (ref 1.7–7.7)
Neutrophils Relative %: 77 %
Platelets: 253 10*3/uL (ref 150–400)
RBC: 4.86 MIL/uL (ref 4.22–5.81)
RDW: 13.6 % (ref 11.5–15.5)
WBC: 7.5 10*3/uL (ref 4.0–10.5)

## 2017-03-06 LAB — BASIC METABOLIC PANEL
Anion gap: 14 (ref 5–15)
BUN: 13 mg/dL (ref 6–20)
CO2: 22 mmol/L (ref 22–32)
Calcium: 10 mg/dL (ref 8.9–10.3)
Chloride: 103 mmol/L (ref 101–111)
Creatinine, Ser: 0.99 mg/dL (ref 0.61–1.24)
GFR calc Af Amer: >60 mL/min (ref >60)
GFR calc non Af Amer: >60 mL/min (ref >60)
Glucose, Bld: 259 mg/dL — ABNORMAL HIGH (ref 65–99)
Potassium: 4 mmol/L (ref 3.5–5.1)
Sodium: 139 mmol/L (ref 135–145)

## 2017-03-06 LAB — MRSA PCR SCREENING: MRSA BY PCR: NEGATIVE

## 2017-03-06 MED ORDER — PREGABALIN 50 MG PO CAPS
50.0000 mg | ORAL_CAPSULE | Freq: Two times a day (BID) | ORAL | Status: DC
  Administered 2017-03-07 – 2017-03-13 (×11): 50 mg via ORAL
  Filled 2017-03-06 (×3): qty 1
  Filled 2017-03-06: qty 2
  Filled 2017-03-06 (×2): qty 1
  Filled 2017-03-06: qty 2
  Filled 2017-03-06 (×3): qty 1
  Filled 2017-03-06: qty 2

## 2017-03-06 MED ORDER — SODIUM CHLORIDE 0.9 % IV SOLN
INTRAVENOUS | Status: DC
  Administered 2017-03-06 – 2017-03-09 (×3): via INTRAVENOUS

## 2017-03-06 MED ORDER — SODIUM CHLORIDE 0.9 % IV SOLN: 250.0000 mL | INTRAVENOUS | Status: DC | PRN

## 2017-03-06 MED ORDER — METFORMIN HCL 500 MG PO TABS
500.0000 mg | ORAL_TABLET | Freq: Two times a day (BID) | ORAL | Status: DC
  Administered 2017-03-07 – 2017-03-13 (×12): 500 mg via ORAL
  Filled 2017-03-06 (×12): qty 1

## 2017-03-06 MED ORDER — ATORVASTATIN CALCIUM 40 MG PO TABS
40.0000 mg | ORAL_TABLET | Freq: Every day | ORAL | Status: DC
  Administered 2017-03-07 – 2017-03-13 (×7): 40 mg via ORAL
  Filled 2017-03-06 (×7): qty 1

## 2017-03-06 MED ORDER — METHOCARBAMOL 500 MG PO TABS: 500.0000 mg | ORAL_TABLET | Freq: Four times a day (QID) | ORAL | Status: DC | PRN

## 2017-03-06 MED ORDER — EZETIMIBE 10 MG PO TABS
10.0000 mg | ORAL_TABLET | Freq: Every evening | ORAL | Status: DC
  Administered 2017-03-07 – 2017-03-13 (×7): 10 mg via ORAL
  Filled 2017-03-06 (×7): qty 1

## 2017-03-06 MED ORDER — HYDROCODONE-ACETAMINOPHEN 5-325 MG PO TABS
1.0000 | ORAL_TABLET | ORAL | Status: DC | PRN
  Administered 2017-03-08: 1 via ORAL
  Filled 2017-03-06: qty 1

## 2017-03-06 MED ORDER — RAMIPRIL 2.5 MG PO CAPS
2.5000 mg | ORAL_CAPSULE | Freq: Every day | ORAL | Status: DC
  Administered 2017-03-08 – 2017-03-13 (×5): 2.5 mg via ORAL
  Filled 2017-03-06 (×7): qty 1

## 2017-03-06 MED ORDER — PROMETHAZINE HCL 25 MG PO TABS: 12.5000 mg | ORAL_TABLET | Freq: Four times a day (QID) | ORAL | Status: DC | PRN

## 2017-03-06 MED ORDER — SODIUM CHLORIDE 0.9% FLUSH: 3.0000 mL | INTRAVENOUS | Status: DC | PRN

## 2017-03-06 MED ORDER — SENNA 8.6 MG PO TABS
1.0000 | ORAL_TABLET | Freq: Two times a day (BID) | ORAL | Status: DC
  Administered 2017-03-07 – 2017-03-13 (×8): 8.6 mg via ORAL
  Filled 2017-03-06 (×9): qty 1

## 2017-03-06 MED ORDER — ATORVASTATIN CALCIUM 40 MG PO TABS: 40.0000 mg | ORAL_TABLET | Freq: Every day | ORAL | Status: DC

## 2017-03-06 MED ORDER — HYDROCODONE-ACETAMINOPHEN 5-325 MG PO TABS: 1.0000 | ORAL_TABLET | ORAL | Status: DC | PRN

## 2017-03-06 MED ORDER — PANTOPRAZOLE SODIUM 40 MG PO TBEC
40.0000 mg | DELAYED_RELEASE_TABLET | Freq: Every day | ORAL | Status: DC
  Administered 2017-03-08: 40 mg via ORAL
  Filled 2017-03-06: qty 1

## 2017-03-06 MED ORDER — CALCIUM-MAGNESIUM-ZINC-D3 PO TABS: 1.0000 | ORAL_TABLET | ORAL | Status: DC

## 2017-03-06 MED ORDER — ACETAMINOPHEN 650 MG RE SUPP: 650.0000 mg | Freq: Four times a day (QID) | RECTAL | Status: DC | PRN

## 2017-03-06 MED ORDER — SODIUM CHLORIDE 0.9% FLUSH
3.0000 mL | Freq: Two times a day (BID) | INTRAVENOUS | Status: DC
  Administered 2017-03-06 – 2017-03-08 (×2): 3 mL via INTRAVENOUS

## 2017-03-06 MED ORDER — ACETAMINOPHEN 325 MG PO TABS: 650.0000 mg | ORAL_TABLET | Freq: Four times a day (QID) | ORAL | Status: DC | PRN

## 2017-03-06 MED ORDER — ZOLPIDEM TARTRATE 5 MG PO TABS
5.0000 mg | ORAL_TABLET | Freq: Every evening | ORAL | Status: DC | PRN
  Administered 2017-03-07 – 2017-03-08 (×2): 5 mg via ORAL
  Filled 2017-03-06 (×2): qty 1

## 2017-03-06 MED ORDER — SENNOSIDES-DOCUSATE SODIUM 8.6-50 MG PO TABS: 1.0000 | ORAL_TABLET | Freq: Every evening | ORAL | Status: DC | PRN

## 2017-03-06 MED ORDER — RIVASTIGMINE 9.5 MG/24HR TD PT24
9.5000 mg | MEDICATED_PATCH | Freq: Every day | TRANSDERMAL | Status: DC
  Administered 2017-03-07 – 2017-03-13 (×7): 9.5 mg via TRANSDERMAL
  Filled 2017-03-06 (×7): qty 1

## 2017-03-06 MED ORDER — CALCIUM CARBONATE-VITAMIN D 500-200 MG-UNIT PO TABS
1.0000 | ORAL_TABLET | Freq: Every day | ORAL | Status: DC
  Administered 2017-03-08 – 2017-03-13 (×5): 1 via ORAL
  Filled 2017-03-06 (×8): qty 1

## 2017-03-06 MED ORDER — MAGNESIUM CITRATE PO SOLN: 1.0000 | Freq: Once | ORAL | Status: DC | PRN

## 2017-03-06 MED ORDER — ADULT MULTIVITAMIN W/MINERALS CH
1.0000 | ORAL_TABLET | Freq: Every day | ORAL | Status: DC
  Administered 2017-03-08 – 2017-03-13 (×5): 1 via ORAL
  Filled 2017-03-06 (×5): qty 1

## 2017-03-06 MED ORDER — VITAMIN D 1000 UNITS PO TABS
2000.0000 [IU] | ORAL_TABLET | Freq: Every day | ORAL | Status: DC
  Administered 2017-03-08 – 2017-03-13 (×5): 2000 [IU] via ORAL
  Filled 2017-03-06 (×5): qty 2

## 2017-03-06 MED ORDER — BISACODYL 10 MG RE SUPP: 10.0000 mg | Freq: Every day | RECTAL | Status: DC | PRN

## 2017-03-06 NOTE — ED Provider Notes (Signed)
[facility] EMERGENCY DEPARTMENT Provider Note   CSN: [id number] Arrival date & time: 03/06/17  1831     History   Chief Complaint Chief Complaint  Patient presents with  . Encephalopathy    HPI Daniel [name] is a 82 y.o. male.  82 year old male with history of lumbar spinal neurosurgical intervention last year here with increased somnolence and decreased ability for ambulation.  Ended up with fluid on brain. Sounds like account of progressive event over months.  The family contacted the neurosurgeon that operated on the patient before and he was offered to come to the emergency department for evaluation and likely admission for another surgical procedure.  The history is provided by the spouse and the patient.  Altered Mental Status   This is a new problem. The current episode started more than 1 week ago. The problem has been gradually worsening. Associated symptoms include somnolence and weakness (general;ized ).    Past Medical History:  Diagnosis Date  . Alzheimer disease   . Arthritis   . Cancer (HCC)    Skin  . Diabetes mellitus without complication ([place])   . GERD (gastroesophageal reflux disease)   . Hyperlipidemia   . Hypertension     Patient Active Problem List   Diagnosis Date Noted  . 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 Urinary frequency 11/16/2016  . Postprocedural hypotension   . Dural tear   . Alzheimer's dementia without behavioral disturbance   . Chronic back pain   . Post-operative pain   . Benign essential HTN   . Leukocytosis   . Acute blood loss anemia   . Thrombocytopenia ([place])   . Spondylolisthesis of lumbar region 09/12/2016  . Essential hypertension 09/12/2016  . Surgery, elective   . Diarrhea 06/08/2016  . Memory loss 03/09/2016  . Essential tremor 03/09/2016  . Diabetic neuropathy ([place]) 03/09/2016  . Vitamin D deficiency 03/09/2016    Past Surgical History:  Procedure Laterality Date  . BACK SURGERY    . CATARACT  EXTRACTION Bilateral 1987 89  . ROTATOR CUFF REPAIR Left 2003       Home Medications    Prior to Admission medications   Medication Sig Start Date End Date Taking? Authorizing Provider  Alpha Lipoic Acid 200 MG CAPS Take 1 capsule by mouth daily.    [provider]  atorvastatin (LIPITOR) 40 MG tablet Take 40 mg by mouth every morning.     [provider]  celecoxib (CELEBREX) 200 MG capsule Take 200 mg by mouth every morning.     [provider]  Cholecalciferol (CVS D3) 2000 units CAPS Take 1 capsule by mouth every morning.     [provider]  dexamethasone (DECADRON) 1 MG tablet 2 tablets twice daily for 2 days, one tablet twice daily for 2 days, one tablet daily for 2 days. 09/20/16   Kristeen Miss, MD  ezetimibe (ZETIA) 10 MG tablet Take 10 mg by mouth every evening.  05/09/15   [provider]  HYDROcodone-acetaminophen (NORCO/VICODIN) 5-325 MG tablet Take 1-2 tablets by mouth every 4 (four) hours as needed (breakthrough pain). 09/20/16   Kristeen Miss, MD  LYRICA 50 MG capsule Take 50 mg by mouth 2 (two) times daily.  06/08/15   [provider]  metFORMIN (GLUCOPHAGE) 500 MG tablet Take 500 mg by mouth 2 (two) times daily. 04/05/15   [provider]  methocarbamol (ROBAXIN) 500 MG tablet Take 1 tablet (500 mg total) by mouth every 6 (six) hours  as needed for muscle spasms. 09/20/16   Kristeen Miss, MD  Multiple Minerals-Vitamins (CALCIUM-MAGNESIUM-ZINC-D3) TABS Take 1 tablet by mouth every morning.     [provider]  Multiple Vitamin (MULTIVITAMIN WITH MINERALS) TABS Take 1 tablet by mouth daily.    [provider]  pantoprazole (PROTONIX) 40 MG tablet Take 40 mg by mouth every morning.     [provider]  ramipril (ALTACE) 2.5 MG capsule Take 2.5 mg by mouth every morning.     [provider]  rivastigmine (EXELON) 9.5 mg/24hr Place 1 patch (9.5 mg total) onto the skin daily. 06/08/16    Sater, Nanine Means, MD    Family History Family History  Problem Relation Age of Onset  . Alzheimer's disease Mother   . Alzheimer's disease Maternal Grandfather     Social History Social History   Tobacco Use  . Smoking status: Former Smoker    Last attempt to quit: 01/16/1978    Years since quitting: 39.1  . Smokeless tobacco: Never Used  Substance Use Topics  . Alcohol use: Yes    Comment: occasionally  . Drug use: No     Allergies   Donepezil   Review of Systems Review of Systems  Constitutional: Negative for chills and fever.  HENT: Negative for sore throat.   Respiratory: Negative for cough, chest tightness and shortness of breath.   Cardiovascular: Negative for chest pain.  Gastrointestinal: Negative for abdominal pain and diarrhea.  Genitourinary: Negative for frequency and hematuria.  Musculoskeletal: Positive for back pain.  Neurological: Positive for weakness (general;ized ).     Physical Exam Updated Vital Signs BP (!) 172/94 (BP Location: Left Arm)   Pulse 77   Temp 98.6 F (37 C) (Oral)   Resp 18   Ht 6\' 1"  (1.854 m)   Wt 70.3 kg (155 lb)   SpO2 99%   BMI 20.45 kg/m   Physical Exam  Constitutional: He appears well-developed and well-nourished.  HENT:  Head: Normocephalic and atraumatic.  Eyes: EOM are normal.  Neck: Normal range of motion. Neck supple.  Cardiovascular: Normal rate and regular rhythm.  Pulmonary/Chest: Effort normal and breath sounds normal. No respiratory distress.  Abdominal: Soft. Bowel sounds are normal.  Musculoskeletal: He exhibits no tenderness or deformity.  Neurological: He is alert.  Skin: Skin is warm and dry. Capillary refill takes less than 2 seconds.  Psychiatric: He has a normal mood and affect.     ED Treatments / Results  Labs (all labs ordered are listed, but only abnormal results are displayed) Labs Reviewed  BASIC METABOLIC PANEL - Abnormal; Notable for the following components:      Result Value    Glucose, Bld 259 (*)    All other components within normal limits  CBC WITH DIFFERENTIAL/PLATELET    EKG  EKG Interpretation None       Radiology No results found.  Procedures Procedures (including critical care time)  Medications Ordered in ED Medications  multivitamin with minerals tablet 1 tablet (not administered)  atorvastatin (LIPITOR) tablet 40 mg (not administered)  ramipril (ALTACE) capsule 2.5 mg (not administered)  pantoprazole (PROTONIX) EC tablet 40 mg (not administered)  metFORMIN (GLUCOPHAGE) tablet 500 mg (not administered)  ezetimibe (ZETIA) tablet 10 mg (not administered)  pregabalin (LYRICA) capsule 50 mg (not administered)  rivastigmine (EXELON) 9.5 mg/24hr 9.5 mg (not administered)  Cholecalciferol CAPS 2,000 Units (not administered)  Calcium-Magnesium-Zinc-D3 TABS 1 tablet (not administered)  methocarbamol (ROBAXIN) tablet 500 mg (not administered)  0.9 %  sodium chloride infusion (not administered)  sodium chloride flush (NS) 0.9 % injection 3 mL (not administered)  sodium chloride flush (NS) 0.9 % injection 3 mL (not administered)  0.9 %  sodium chloride infusion (not administered)  acetaminophen (TYLENOL) tablet 650 mg (not administered)    Or  acetaminophen (TYLENOL) suppository 650 mg (not administered)  HYDROcodone-acetaminophen (NORCO/VICODIN) 5-325 MG per tablet 1-2 tablet (not administered)  zolpidem (AMBIEN) tablet 5 mg (not administered)  senna (SENOKOT) tablet 8.6 mg (not administered)  senna-docusate (Senokot-S) tablet 1 tablet (not administered)  bisacodyl (DULCOLAX) suppository 10 mg (not administered)  magnesium citrate solution 1 Bottle (not administered)  promethazine (PHENERGAN) tablet 12.5 mg (not administered)     Initial Impression / Assessment and Plan / ED Course  I have reviewed the triage vital signs and the nursing notes.  Pertinent labs & imaging results that were available during my care of the patient were  reviewed by me and considered in my medical decision making (see chart for details).       Final Clinical Impressions(s) / ED Diagnoses   Final diagnoses:  Encephalopathy    ED Discharge Orders    None       Hayden Rasmussen, MD 03/08/17 1119

## 2017-03-06 NOTE — Progress Notes (Signed)
Pt had arrived to unit around 2100, upon my assessment there was a red spot on the bony prominence of the pts R ankle bone. This spot is blanchable at this time. Foam applied, WCTM.

## 2017-03-06 NOTE — ED Notes (Signed)
Called [place] PA per Dr. Ellene Route to come see the pt. He stated, Call me when he is in a room.

## 2017-03-06 NOTE — H&P (Signed)
Chief Complaint   Chief Complaint  Patient presents with  . Encephalopathy    HPI   HPI: Daniel [name] is a 82 y.o. male brought to ER due to gait instability. He has a history of subdural hygroma that has been serially monitored since November 2018. His most recent scan was Feb 07, 2017. He was scheduled for a follow up CT on 03/14/2017 but due to aforementioned symptoms, his wife called Dr Ellene Route and it was recommended he have a repeat head CT sooner so he was advised to come to ER. Does have a history of mild alzheimer's.   His wife at bedside describes shuffling gait with walker as well as becoming less talkative than usual over the past 10-14 days. Patient denies any symptoms including headache, dizziness, changes in vision, nausea/vomiting, photophobia, paresthesias, focal deficits. Not on anti-coag.  Patient Active Problem List   Diagnosis Date Noted  . 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 Urinary frequency 11/16/2016  . Postprocedural hypotension   . Dural tear   . Alzheimer's dementia without behavioral disturbance   . Chronic back pain   . Post-operative pain   . Benign essential HTN   . Leukocytosis   . Acute blood loss anemia   . Thrombocytopenia ([place])   . Spondylolisthesis of lumbar region 09/12/2016  . Essential hypertension 09/12/2016  . Surgery, elective   . Diarrhea 06/08/2016  . Memory loss 03/09/2016  . Essential tremor 03/09/2016  . Diabetic neuropathy ([place]) 03/09/2016  . Vitamin D deficiency 03/09/2016    PMH: Past Medical History:  Diagnosis Date  . Alzheimer disease   . Arthritis   . Cancer (HCC)    Skin  . Diabetes mellitus without complication ([place])   . GERD (gastroesophageal reflux disease)   . Hyperlipidemia   . Hypertension     PSH: Past Surgical History:  Procedure Laterality Date  . BACK SURGERY    . CATARACT EXTRACTION Bilateral 1987 89  . ROTATOR CUFF REPAIR Left 2003     (Not in a hospital admission)  SH: Social History    Tobacco Use  . Smoking status: Former Smoker    Last attempt to quit: 01/16/1978    Years since quitting: 39.1  . Smokeless tobacco: Never Used  Substance Use Topics  . Alcohol use: Yes    Comment: occasionally  . Drug use: No    MEDS: Prior to Admission medications   Medication Sig Start Date End Date Taking? Authorizing Provider  Alpha Lipoic Acid 200 MG CAPS Take 1 capsule by mouth daily.    [provider]  atorvastatin (LIPITOR) 40 MG tablet Take 40 mg by mouth every morning.     [provider]  celecoxib (CELEBREX) 200 MG capsule Take 200 mg by mouth every morning.     [provider]  Cholecalciferol ([employer] D3) 2000 units CAPS Take 1 capsule by mouth every morning.     [provider]  dexamethasone (DECADRON) 1 MG tablet 2 tablets twice daily for 2 days, one tablet twice daily for 2 days, one tablet daily for 2 days. 09/20/16   Kristeen Miss, MD  ezetimibe (ZETIA) 10 MG tablet Take 10 mg by mouth every evening.  05/09/15   [provider]  HYDROcodone-acetaminophen (NORCO/VICODIN) 5-325 MG tablet Take 1-2 tablets by mouth every 4 (four) hours as needed (breakthrough pain). 09/20/16   Kristeen Miss, MD  LYRICA 50 MG capsule Take 50 mg by mouth 2 (two) times daily.  06/08/15   [provider]  metFORMIN (GLUCOPHAGE) 500 MG tablet Take 500 mg by mouth 2 (two) times daily. 04/05/15   [provider]  methocarbamol (ROBAXIN) 500 MG tablet Take 1 tablet (500 mg total) by mouth every 6 (six) hours as needed for muscle spasms. 09/20/16   Kristeen Miss, MD  Multiple Minerals-Vitamins (CALCIUM-MAGNESIUM-ZINC-D3) TABS Take 1 tablet by mouth every morning.     [provider]  Multiple Vitamin (MULTIVITAMIN WITH MINERALS) TABS Take 1 tablet by mouth daily.    [provider]  pantoprazole (PROTONIX) 40 MG tablet Take 40 mg by mouth every morning.     [provider]  ramipril (ALTACE) 2.5 MG capsule Take 2.5 mg  by mouth every morning.     [provider]  rivastigmine (EXELON) 9.5 mg/24hr Place 1 patch (9.5 mg total) onto the skin daily. 06/08/16   Sater, Nanine Means, MD    ALLERGY: Allergies  Allergen Reactions  . Donepezil Diarrhea    Social History   Tobacco Use  . Smoking status: Former Smoker    Last attempt to quit: 01/16/1978    Years since quitting: 39.1  . Smokeless tobacco: Never Used  Substance Use Topics  . Alcohol use: Yes    Comment: occasionally     Family History  Problem Relation Age of Onset  . Alzheimer's disease Mother   . Alzheimer's disease Maternal Grandfather      ROS   Review of Systems  Constitutional: Negative.   HENT: Negative.   Eyes: Negative.   Respiratory: Negative.   Cardiovascular: Negative.   Gastrointestinal: Negative.   Genitourinary: Negative.   Musculoskeletal: Negative.   Skin: Negative.   Neurological: Negative for dizziness, tingling, tremors, sensory change, speech change, focal weakness, seizures, loss of consciousness and headaches.       + change in gait    Exam   Vitals:   03/06/17 1843  BP: (!) 172/94  Pulse: 77  Resp: 18  Temp: 98.6 F (37 C)  SpO2: 99%   General appearance: elderly male, resting comfortably, NAD Eyes: PERRL, Fundoscopic: normal Cardiovascular: Regular rate and rhythm without murmurs, rubs, gallops. No edema or variciosities. Distal pulses normal. Pulmonary: Clear to auscultation Musculoskeletal:     Gait: did not examine    Muscle tone upper extremities: Normal    Muscle tone lower extremities: Normal    Motor exam: Upper Extremities Deltoid Bicep Tricep Grip  Right 5/5 5/5 5/5 5/5  Left 5/5 5/5 5/5 5/5   Lower Extremity IP Quad PF DF EHL  Right 5/5 5/5 5/5 5/5 5/5  Left 5/5 5/5 5/5 5/5 5/5   Neurological Awake, alert, oriented x4 Has to be asked questions to initiate conversation, otherwise wife does all talking. Gives 1-2 word answers to questions. Does have some difficulties  with two step commands CNII: Visual fields normal CNIII/IV/VI: EOMI CNV: Facial sensation normal CNVII: minor facial droop CNVIII: Grossly normal CNIX: Normal palate movement CNXI: Trap and SCM strength normal CN XII: Tongue protrusion normal Sensation grossly intact to LT Slight right pronator drift  Results - Imaging/Labs   Results for orders placed or performed during the hospital encounter of 03/06/17 (from the past 48 hour(s))  CBC with Differential     Status: None   Collection Time: 03/06/17  6:47 PM  Result Value Ref Range   WBC 7.5 4.0 - 10.5 K/uL   RBC 4.86 4.22 - 5.81 MIL/uL   Hemoglobin 13.9 13.0 - 17.0 g/dL   HCT 42.6 39.0 -  52.0 %   MCV 87.7 78.0 - 100.0 fL   MCH 28.6 26.0 - 34.0 pg   MCHC 32.6 30.0 - 36.0 g/dL   RDW 13.6 11.5 - 15.5 %   Platelets 253 150 - 400 K/uL   Neutrophils Relative % 77 %   Neutro Abs 5.8 1.7 - 7.7 K/uL   Lymphocytes Relative 17 %   Lymphs Abs 1.3 0.7 - 4.0 K/uL   Monocytes Relative 5 %   Monocytes Absolute 0.4 0.1 - 1.0 K/uL   Eosinophils Relative 0 %   Eosinophils Absolute 0.0 0.0 - 0.7 K/uL   Basophils Relative 1 %   Basophils Absolute 0.0 0.0 - 0.1 K/uL    Comment: Performed at [facility] [address], [place], [place] [address]  Basic metabolic panel     Status: Abnormal   Collection Time: 03/06/17  6:47 PM  Result Value Ref Range   Sodium 139 135 - 145 mmol/L   Potassium 4.0 3.5 - 5.1 mmol/L   Chloride 103 101 - 111 mmol/L   CO2 22 22 - 32 mmol/L   Glucose, Bld 259 (H) 65 - 99 mg/dL   BUN 13 6 - 20 mg/dL   Creatinine, Ser 0.99 0.61 - 1.24 mg/dL   Calcium 10.0 8.9 - 10.3 mg/dL   GFR calc non Af Amer >60 >60 mL/min   GFR calc Af Amer >60 >60 mL/min    Comment: (NOTE) The eGFR has been calculated using the CKD EPI equation. This calculation has not been validated in all clinical situations. eGFR's persistently <60 mL/min signify possible Chronic Kidney Disease.    Anion gap 14 5 - 15    Comment: Performed at  [facility] [address] [place], [place] [address]   No results found.  Impression/Plan   83 y.o. male with worsening gait instability and decreased communication with wife over the past 10-14 days. Has a known history of subdural hygroma with 46m midline shift. Initially, wife was hesitant for any surgical intervention, but due to worsening mentation and gait instability, they would like to proceed. Patient admitted for repeat head CT and for evacuation of hygroma tomorrow. Patient will be admitted to ICU. Keep NPO at midnight.   Addendum CT scan relatively stable with exception of left acute component. Will proceed with evacuation tomorrow. Wife would like to discuss with Dr NKathyrn Sherifftomorrow. Please call wife when Dr NKathyrn Sheriffsees patient if she is not present. I have advised her to discuss this with his nurses when he goes up to the ICU.  Dr NKathyrn Sheriffto arrange for surgery. Will not be done today.

## 2017-03-06 NOTE — ED Triage Notes (Signed)
Per Family, Pt is coming from home with increase in difficulty with mobility starting ten days ago along with changes in speech. Pt has known fluid building on the brain, but has gotten significantly worse. Was sent by MD to be evaluated.

## 2017-03-06 NOTE — ED Notes (Signed)
Ordered CT scan per telephone order Dr. Ellene Route. Paged Dr. On call

## 2017-03-07 ENCOUNTER — Other Ambulatory Visit: Payer: Self-pay | Admitting: Neurosurgery

## 2017-03-07 LAB — GLUCOSE, CAPILLARY
GLUCOSE-CAPILLARY: 139 mg/dL — AB (ref 65–99)
GLUCOSE-CAPILLARY: 144 mg/dL — AB (ref 65–99)
GLUCOSE-CAPILLARY: 160 mg/dL — AB (ref 65–99)
Glucose-Capillary: 155 mg/dL — ABNORMAL HIGH (ref 65–99)
Glucose-Capillary: 227 mg/dL — ABNORMAL HIGH (ref 65–99)

## 2017-03-07 MED ORDER — HYDRALAZINE HCL 20 MG/ML IJ SOLN
20.0000 mg | Freq: Three times a day (TID) | INTRAMUSCULAR | Status: DC | PRN
  Administered 2017-03-07 – 2017-03-13 (×3): 20 mg via INTRAVENOUS
  Filled 2017-03-07 (×3): qty 1

## 2017-03-07 MED ORDER — GLUCERNA SHAKE PO LIQD
237.0000 mL | Freq: Three times a day (TID) | ORAL | Status: DC
  Administered 2017-03-07 – 2017-03-12 (×12): 237 mL via ORAL

## 2017-03-07 MED ORDER — CHLORHEXIDINE GLUCONATE CLOTH 2 % EX PADS: 6.0000 | MEDICATED_PAD | Freq: Once | CUTANEOUS | Status: DC

## 2017-03-07 MED ORDER — LABETALOL HCL 5 MG/ML IV SOLN
10.0000 mg | INTRAVENOUS | Status: DC | PRN
  Administered 2017-03-08: 20 mg via INTRAVENOUS
  Filled 2017-03-07 (×2): qty 4

## 2017-03-07 NOTE — Progress Notes (Signed)
Paged Ditty again, HR too low for previous PRN, Hydralazine ordered.

## 2017-03-07 NOTE — Progress Notes (Signed)
Initial Nutrition Assessment  DOCUMENTATION CODES:   Non-severe (moderate) malnutrition in context of chronic illness  INTERVENTION:   Glucerna Shake po TID, each supplement provides 220 kcal and 10 grams of protein  NUTRITION DIAGNOSIS:   Moderate Malnutrition related to chronic illness(dementia) as evidenced by 10.4% weight loss in 6 months, energy intake < or equal to 75% for > or equal to 1 month, moderate fat depletion, moderate muscle depletion, severe muscle depletion.  GOAL:   Patient will meet greater than or equal to 90% of their needs  MONITOR:   PO intake, Supplement acceptance, Weight trends, Labs, I & O's  REASON FOR ASSESSMENT:   Malnutrition Screening Tool    ASSESSMENT:   Pt with PMH significant for Alzheimer's dementia, HTN, diabetic neuropathy, and subdural hygroma that has been closely monitored since 11/2016. Presents this admission with complaints of worsening gait stability and decreased communication. Family intiatlly hesitatant about surgical intervention of subdural hygroma but with worsening symptoms they have decided to have evacuation of hygroma.    Pt confused upon assessment. Spoke with wife at bedside. Reports appetite had steady decline over the year due to decrease sense of smell with progressive dementia. Pt typically consumes two meals per day versus three because he is "sleeping more." Meals typically consist of sandwiches, soup, and cereal. Pt drinks [facility] protein supplementation inconsistently at home. Wife concerned about today's lunch tray, stating they ordered over two hours ago. Nurse called to check status. Nurse reports family concerned about blood sugars. Will provide carbohydrate controlled supplementation.   Wife endorses pt's UBW stays around 170 lb and 15 lb weight loss since surgery in August. Records indicate pt weighed 173 lb 09/12/16 and 155 lb this admission (confirmed by wife) showing a 10.4 % wt loss in 6 months, significant  for time frame. Nutrition-Focused physical exam completed.   Medications reviewed and include: calcium, Vitamin D, metformin, MVI with minerals, NS @ 10 ml/hr Labs reviewed: CBG 259 (H)  NUTRITION - FOCUSED PHYSICAL EXAM:    Most Recent Value  Orbital Region  Mild depletion  Upper Arm Region  Moderate depletion  Thoracic and Lumbar Region  Unable to assess  Buccal Region  Moderate depletion  Temple Region  Moderate depletion  Clavicle Bone Region  Severe depletion  Clavicle and Acromion Bone Region  Severe depletion  Scapular Bone Region  Unable to assess  Dorsal Hand  Severe depletion  Patellar Region  Severe depletion  Anterior Thigh Region  Severe depletion  Posterior Calf Region  Severe depletion  Edema (RD Assessment)  None  Hair  Reviewed  Eyes  Reviewed  Mouth  Reviewed  Skin  Reviewed  Nails  Reviewed     Diet Order:  Seizure precautions Diet regular Room service appropriate? Yes; Fluid consistency: Thin  EDUCATION NEEDS:   Education needs have been addressed  Skin:  Skin Assessment: Reviewed RN Assessment  Last BM:  PTA  Height:   Ht Readings from Last 1 Encounters:  03/06/17 6\' 1"  (1.854 m)    Weight:   Wt Readings from Last 1 Encounters:  03/06/17 155 lb (70.3 kg)    Ideal Body Weight:  83.6 kg  BMI:  Body mass index is 20.45 kg/m.  Estimated Nutritional Needs:   Kcal:  2100-2300 kcal/day  Protein:  105-115 g/day  Fluid:  >2.1 L/day    Mariana Single RD, LDN Clinical Nutrition Pager # - [phone]

## 2017-03-07 NOTE — Progress Notes (Signed)
No issues overnight.   EXAM:  BP (!) 131/99   Pulse 65   Temp (!) 97.4 F (36.3 C) (Oral)   Resp (!) 23   Ht 6\' 1"  (1.854 m)   Wt 70.3 kg (155 lb)   SpO2 99%   BMI 20.45 kg/m   Awake, alert, oriented  Speech fluent CN grossly intact MAE symmetrically  IMPRESSION:  82 y.o. male with chronic bilateral SDH with progressive clinical decline. At this point will require operative evacuation.  PLAN: - Will proceed with bilateral evacuation of SDH tentatively planned on Friday afternoon  I have reviewed the situation personally with the patient and wife. We discussed my recommendation for operative evacuation. Risks of the surgery were reviewed including bleeding, infection, seizures, MI, stroke, DVT/PE. We also discussed the possibility of future re-accumulation of blood requiring additional surgeries. Pt and wife appeared to understand our discussion. After all their questions were answered, they provided consent to proceed.

## 2017-03-07 NOTE — Progress Notes (Signed)
Inpatient Diabetes Program Recommendations  AACE/ADA: New Consensus Statement on Inpatient Glycemic Control (2015)  Target Ranges:  Prepandial:   less than 140 mg/dL      Peak postprandial:   less than 180 mg/dL (1-2 hours)      Critically ill patients:  140 - 180 mg/dL   Lab Results  Component Value Date   GLUCAP 155 (H) 03/07/2017   HGBA1C 6.9 (H) 09/06/2016    Review of Glycemic ControlResults for Daniel [name], Daniel [name] (MRN [id number]) as of 03/07/2017 13:00  Ref. Range 03/07/2017 10:02 03/07/2017 12:02  Glucose-Capillary Latest Ref Range: 65 - 99 mg/dL 160 (H) 155 (H)    Diabetes history: Type 2 DM Outpatient Diabetes medications: Metformin 500 mg bid Current orders for Inpatient glycemic control:  Metformin 500 mg bid  Inpatient Diabetes Program Recommendations:    Please consider adding Novolog sensitive correction tid with meals and HS.    Thanks,  Adah Perl, RN, BC-ADM Inpatient Diabetes Coordinator Pager [phone] (8a-5p)

## 2017-03-07 NOTE — Progress Notes (Signed)
Paged Ditty in regards to pressure remaining >160, PRN labetalol ordered

## 2017-03-07 NOTE — Progress Notes (Signed)
Patient ID: Daniel [name], male   DOB: [birth year], 82 y.o.   MRN: [id number] Discussed patients care plan with his wife. Dr Kathyrn Sheriff will plan surgery to drain subdurals bilaterally with burr holes. Explained potential complications esp possibility of further more extensive surgery. I will assume his care when I return.

## 2017-03-08 DIAGNOSIS — E440 Moderate protein-calorie malnutrition: Secondary | ICD-10-CM

## 2017-03-08 LAB — GLUCOSE, CAPILLARY
GLUCOSE-CAPILLARY: 187 mg/dL — AB (ref 65–99)
GLUCOSE-CAPILLARY: 136 mg/dL — AB (ref 65–99)
Glucose-Capillary: 131 mg/dL — ABNORMAL HIGH (ref 65–99)
Glucose-Capillary: 189 mg/dL — ABNORMAL HIGH (ref 65–99)
Glucose-Capillary: 162 mg/dL — ABNORMAL HIGH (ref 65–99)

## 2017-03-08 MED ORDER — INSULIN ASPART 100 UNIT/ML ~~LOC~~ SOLN
0.0000 [IU] | Freq: Every day | SUBCUTANEOUS | Status: DC
  Administered 2017-03-09 – 2017-03-10 (×2): 2 [IU] via SUBCUTANEOUS

## 2017-03-08 MED ORDER — INSULIN ASPART 100 UNIT/ML ~~LOC~~ SOLN
0.0000 [IU] | Freq: Three times a day (TID) | SUBCUTANEOUS | Status: DC
  Administered 2017-03-08 – 2017-03-10 (×3): 2 [IU] via SUBCUTANEOUS
  Administered 2017-03-10 – 2017-03-11 (×3): 1 [IU] via SUBCUTANEOUS
  Administered 2017-03-11: 2 [IU] via SUBCUTANEOUS
  Administered 2017-03-11: 1 [IU] via SUBCUTANEOUS
  Administered 2017-03-12 – 2017-03-13 (×5): 2 [IU] via SUBCUTANEOUS
  Administered 2017-03-13: 1 [IU] via SUBCUTANEOUS

## 2017-03-08 NOTE — Progress Notes (Addendum)
Neurosurgery Progress Note  No issues overnight.  No concerns this am  EXAM:  BP (!) 149/74   Pulse (!) 59   Temp 98.3 F (36.8 C) (Oral)   Resp (!) 24   Ht 6\' 1"  (1.854 m)   Wt 70.3 kg (155 lb)   SpO2 96%   BMI 20.45 kg/m   Awake, alert, oriented  Speech fluent, appropriate  CN grossly intact  MAEW with food strength  PLAN Stable. Will proceed with bilateral evacuation of SDH tomorrow afternoon. NPO at mightnight  Reviewed diabetes coordinator recs. Orders placed for Novolog sensitive correction with meals and hs. Appreciate assistance.

## 2017-03-09 ENCOUNTER — Inpatient Hospital Stay (HOSPITAL_COMMUNITY): Payer: PPO | Admitting: Certified Registered"

## 2017-03-09 ENCOUNTER — Encounter (HOSPITAL_COMMUNITY)
Admission: EM | Disposition: A | Discharge status: Rehabilitation Facility | Payer: Self-pay | Source: Home / Self Care | Attending: Neurosurgery

## 2017-03-09 ENCOUNTER — Encounter (HOSPITAL_COMMUNITY): Payer: Self-pay | Admitting: Certified Registered"

## 2017-03-09 HISTORY — PX: BURR HOLE: SHX908

## 2017-03-09 LAB — GLUCOSE, CAPILLARY
GLUCOSE-CAPILLARY: 147 mg/dL — AB (ref 65–99)
GLUCOSE-CAPILLARY: 176 mg/dL — AB (ref 65–99)
GLUCOSE-CAPILLARY: 223 mg/dL — AB (ref 65–99)
GLUCOSE-CAPILLARY: 217 mg/dL — AB (ref 65–99)

## 2017-03-09 LAB — SURGICAL PCR SCREEN
MRSA, PCR: NEGATIVE
STAPHYLOCOCCUS AUREUS: NEGATIVE

## 2017-03-09 SURGERY — CREATION, CRANIAL BURR HOLE
Anesthesia: General | Site: Head | Laterality: Bilateral

## 2017-03-09 MED ORDER — SODIUM CHLORIDE 0.9 % IV SOLN
INTRAVENOUS | Status: DC
  Administered 2017-03-09: 11:00:00 via INTRAVENOUS

## 2017-03-09 MED ORDER — LABETALOL HCL 5 MG/ML IV SOLN
INTRAVENOUS | Status: AC
  Filled 2017-03-09: qty 4

## 2017-03-09 MED ORDER — BUPIVACAINE HCL (PF) 0.5 % IJ SOLN
INTRAMUSCULAR | Status: DC | PRN
Start: 2017-03-09 — End: 2017-03-09
  Administered 2017-03-09: 5 mL

## 2017-03-09 MED ORDER — GLYCOPYRROLATE 0.2 MG/ML IV SOSY
PREFILLED_SYRINGE | INTRAVENOUS | Status: DC | PRN
  Administered 2017-03-09: .2 mg via INTRAVENOUS

## 2017-03-09 MED ORDER — LIDOCAINE 2% (20 MG/ML) 5 ML SYRINGE
INTRAMUSCULAR | Status: AC
  Filled 2017-03-09: qty 5

## 2017-03-09 MED ORDER — SODIUM CHLORIDE 0.9 % IV SOLN
INTRAVENOUS | Status: DC | PRN
  Administered 2017-03-09: 13:00:00 via INTRAVENOUS

## 2017-03-09 MED ORDER — LABETALOL HCL 5 MG/ML IV SOLN
INTRAVENOUS | Status: DC | PRN
  Administered 2017-03-09 (×2): 5 mg via INTRAVENOUS

## 2017-03-09 MED ORDER — SODIUM CHLORIDE 0.9 % IV SOLN
INTRAVENOUS | Status: DC
  Administered 2017-03-09 – 2017-03-10 (×2): via INTRAVENOUS

## 2017-03-09 MED ORDER — CEFAZOLIN SODIUM-DEXTROSE 1-4 GM/50ML-% IV SOLN
1.0000 g | Freq: Three times a day (TID) | INTRAVENOUS | Status: AC
  Administered 2017-03-10: 1 g via INTRAVENOUS
  Filled 2017-03-09 (×3): qty 50

## 2017-03-09 MED ORDER — ROCURONIUM BROMIDE 10 MG/ML (PF) SYRINGE
PREFILLED_SYRINGE | INTRAVENOUS | Status: AC
  Filled 2017-03-09: qty 5

## 2017-03-09 MED ORDER — BACITRACIN ZINC 500 UNIT/GM EX OINT
TOPICAL_OINTMENT | CUTANEOUS | Status: DC | PRN
  Administered 2017-03-09: 1 via TOPICAL

## 2017-03-09 MED ORDER — DEXAMETHASONE SODIUM PHOSPHATE 10 MG/ML IJ SOLN
INTRAMUSCULAR | Status: AC
  Filled 2017-03-09: qty 1

## 2017-03-09 MED ORDER — NALOXONE HCL 0.4 MG/ML IJ SOLN: 0.0800 mg | INTRAMUSCULAR | Status: DC | PRN

## 2017-03-09 MED ORDER — THROMBIN (RECOMBINANT) 5000 UNITS EX SOLR
OROMUCOSAL | Status: DC | PRN
  Administered 2017-03-09: 5 mL via TOPICAL

## 2017-03-09 MED ORDER — LIDOCAINE-EPINEPHRINE 1 %-1:100000 IJ SOLN
INTRAMUSCULAR | Status: AC
  Filled 2017-03-09: qty 1

## 2017-03-09 MED ORDER — THROMBIN (RECOMBINANT) 20000 UNITS EX SOLR
CUTANEOUS | Status: AC
  Filled 2017-03-09: qty 20000

## 2017-03-09 MED ORDER — CEFAZOLIN SODIUM-DEXTROSE 2-4 GM/100ML-% IV SOLN
2.0000 g | INTRAVENOUS | Status: DC
Start: 2017-03-09 — End: 2017-03-09

## 2017-03-09 MED ORDER — SODIUM CHLORIDE 0.9 % IR SOLN
Status: DC | PRN
  Administered 2017-03-09: 500 mL

## 2017-03-09 MED ORDER — PROPOFOL 10 MG/ML IV BOLUS
INTRAVENOUS | Status: DC | PRN
  Administered 2017-03-09: 110 mg via INTRAVENOUS

## 2017-03-09 MED ORDER — BUPIVACAINE HCL (PF) 0.5 % IJ SOLN
INTRAMUSCULAR | Status: AC
  Filled 2017-03-09: qty 30

## 2017-03-09 MED ORDER — PROMETHAZINE HCL 25 MG PO TABS: 12.5000 mg | ORAL_TABLET | ORAL | Status: DC | PRN

## 2017-03-09 MED ORDER — 0.9 % SODIUM CHLORIDE (POUR BTL) OPTIME
TOPICAL | Status: DC | PRN
  Administered 2017-03-09 (×4): 1000 mL

## 2017-03-09 MED ORDER — ONDANSETRON HCL 4 MG/2ML IJ SOLN: 4.0000 mg | INTRAMUSCULAR | Status: DC | PRN

## 2017-03-09 MED ORDER — CEFAZOLIN SODIUM-DEXTROSE 2-4 GM/100ML-% IV SOLN
2.0000 g | INTRAVENOUS | Status: AC
  Filled 2017-03-09: qty 100

## 2017-03-09 MED ORDER — SODIUM CHLORIDE 0.9 % IV SOLN
500.0000 mg | Freq: Two times a day (BID) | INTRAVENOUS | Status: DC
  Administered 2017-03-09 – 2017-03-11 (×4): 500 mg via INTRAVENOUS
  Filled 2017-03-09 (×5): qty 5

## 2017-03-09 MED ORDER — THROMBIN (RECOMBINANT) 20000 UNITS EX SOLR
CUTANEOUS | Status: DC | PRN
  Administered 2017-03-09: 20 mL via TOPICAL

## 2017-03-09 MED ORDER — HEMOSTATIC AGENTS (NO CHARGE) OPTIME
TOPICAL | Status: DC | PRN
  Administered 2017-03-09: 1 via TOPICAL

## 2017-03-09 MED ORDER — PHENYLEPHRINE HCL 10 MG/ML IJ SOLN
INTRAVENOUS | Status: DC | PRN
  Administered 2017-03-09: 25 ug/min via INTRAVENOUS

## 2017-03-09 MED ORDER — HYDROCODONE-ACETAMINOPHEN 5-325 MG PO TABS
1.0000 | ORAL_TABLET | ORAL | Status: DC | PRN
  Administered 2017-03-09 – 2017-03-11 (×5): 1 via ORAL
  Filled 2017-03-09 (×5): qty 1

## 2017-03-09 MED ORDER — ACETAMINOPHEN 650 MG RE SUPP
650.0000 mg | RECTAL | Status: DC | PRN
Start: 2017-03-09 — End: 2017-03-13

## 2017-03-09 MED ORDER — THROMBIN (RECOMBINANT) 5000 UNITS EX SOLR
CUTANEOUS | Status: AC
  Filled 2017-03-09: qty 5000

## 2017-03-09 MED ORDER — PROPOFOL 10 MG/ML IV BOLUS
INTRAVENOUS | Status: AC
  Filled 2017-03-09: qty 20

## 2017-03-09 MED ORDER — ACETAMINOPHEN 325 MG PO TABS
650.0000 mg | ORAL_TABLET | ORAL | Status: DC | PRN
  Administered 2017-03-13: 650 mg via ORAL
  Filled 2017-03-09: qty 2

## 2017-03-09 MED ORDER — PANTOPRAZOLE SODIUM 40 MG IV SOLR
40.0000 mg | Freq: Every day | INTRAVENOUS | Status: DC
  Administered 2017-03-09: 40 mg via INTRAVENOUS
  Filled 2017-03-09: qty 40

## 2017-03-09 MED ORDER — WHITE PETROLATUM EX OINT
TOPICAL_OINTMENT | CUTANEOUS | Status: AC
  Administered 2017-03-09: 20:00:00
  Filled 2017-03-09: qty 28.35

## 2017-03-09 MED ORDER — ONDANSETRON HCL 4 MG PO TABS: 4.0000 mg | ORAL_TABLET | ORAL | Status: DC | PRN

## 2017-03-09 MED ORDER — LIDOCAINE 2% (20 MG/ML) 5 ML SYRINGE
INTRAMUSCULAR | Status: DC | PRN
  Administered 2017-03-09: 40 mg via INTRAVENOUS

## 2017-03-09 MED ORDER — FENTANYL CITRATE (PF) 250 MCG/5ML IJ SOLN
INTRAMUSCULAR | Status: AC
  Filled 2017-03-09: qty 5

## 2017-03-09 MED ORDER — SUGAMMADEX SODIUM 200 MG/2ML IV SOLN
INTRAVENOUS | Status: AC
  Filled 2017-03-09: qty 2

## 2017-03-09 MED ORDER — FENTANYL CITRATE (PF) 100 MCG/2ML IJ SOLN: 25.0000 ug | INTRAMUSCULAR | Status: DC | PRN

## 2017-03-09 MED ORDER — MEPERIDINE HCL 50 MG/ML IJ SOLN: 6.2500 mg | INTRAMUSCULAR | Status: DC | PRN

## 2017-03-09 MED ORDER — FENTANYL CITRATE (PF) 100 MCG/2ML IJ SOLN
INTRAMUSCULAR | Status: DC | PRN
  Administered 2017-03-09 (×2): 50 ug via INTRAVENOUS
  Administered 2017-03-09: 100 ug via INTRAVENOUS
  Administered 2017-03-09: 50 ug via INTRAVENOUS

## 2017-03-09 MED ORDER — SUGAMMADEX SODIUM 200 MG/2ML IV SOLN
INTRAVENOUS | Status: DC | PRN
  Administered 2017-03-09: 200 mg via INTRAVENOUS

## 2017-03-09 MED ORDER — DEXAMETHASONE SODIUM PHOSPHATE 10 MG/ML IJ SOLN
INTRAMUSCULAR | Status: DC | PRN
  Administered 2017-03-09: 10 mg via INTRAVENOUS

## 2017-03-09 MED ORDER — CEFAZOLIN SODIUM-DEXTROSE 2-3 GM-%(50ML) IV SOLR
INTRAVENOUS | Status: DC | PRN
  Administered 2017-03-09: 2 g via INTRAVENOUS

## 2017-03-09 MED ORDER — BACITRACIN ZINC 500 UNIT/GM EX OINT
TOPICAL_OINTMENT | CUTANEOUS | Status: AC
  Filled 2017-03-09: qty 28.35

## 2017-03-09 MED ORDER — METOCLOPRAMIDE HCL 5 MG/ML IJ SOLN: 10.0000 mg | Freq: Once | INTRAMUSCULAR | Status: DC | PRN

## 2017-03-09 MED ORDER — ONDANSETRON HCL 4 MG/2ML IJ SOLN
INTRAMUSCULAR | Status: DC | PRN
  Administered 2017-03-09: 4 mg via INTRAVENOUS

## 2017-03-09 MED ORDER — ROCURONIUM BROMIDE 10 MG/ML (PF) SYRINGE
PREFILLED_SYRINGE | INTRAVENOUS | Status: DC | PRN
  Administered 2017-03-09: 50 mg via INTRAVENOUS

## 2017-03-09 MED ORDER — LIDOCAINE-EPINEPHRINE 1 %-1:100000 IJ SOLN
INTRAMUSCULAR | Status: DC | PRN
  Administered 2017-03-09: 5 mL

## 2017-03-09 MED ORDER — LABETALOL HCL 5 MG/ML IV SOLN
10.0000 mg | INTRAVENOUS | Status: DC | PRN
  Administered 2017-03-09: 20 mg via INTRAVENOUS
  Filled 2017-03-09: qty 4

## 2017-03-09 MED ORDER — HYDROMORPHONE HCL 1 MG/ML IJ SOLN: 0.5000 mg | INTRAMUSCULAR | Status: DC | PRN

## 2017-03-09 MED ORDER — ONDANSETRON HCL 4 MG/2ML IJ SOLN
INTRAMUSCULAR | Status: AC
  Filled 2017-03-09: qty 2

## 2017-03-09 MED ORDER — CEFAZOLIN SODIUM 1 G IJ SOLR
INTRAMUSCULAR | Status: AC
  Filled 2017-03-09: qty 20

## 2017-03-09 SURGICAL SUPPLY — 78 items
APL SKNCLS STERI-STRIP NONHPOA (GAUZE/BANDAGES/DRESSINGS)
BENZOIN TINCTURE PRP APPL 2/3 (GAUZE/BANDAGES/DRESSINGS) IMPLANT
BLADE CLIPPER SURG (BLADE) ×3 IMPLANT
BLADE ULTRA TIP 2M (BLADE) ×3 IMPLANT
BNDG GAUZE ELAST 4 BULKY (GAUZE/BANDAGES/DRESSINGS) IMPLANT
BUR ACORN 6.0 PRECISION (BURR) ×2 IMPLANT
BUR ACORN 6.0MM PRECISION (BURR) ×1
BUR MATCHSTICK NEURO 3.0 LAGG (BURR) IMPLANT
CANISTER SUCT 3000ML PPV (MISCELLANEOUS) ×3 IMPLANT
CARTRIDGE OIL MAESTRO DRILL (MISCELLANEOUS) ×1 IMPLANT
CATH ROBINSON RED A/P 16FR (CATHETERS) ×2 IMPLANT
CLIP VESOCCLUDE MED 6/CT (CLIP) IMPLANT
DIFFUSER DRILL AIR PNEUMATIC (MISCELLANEOUS) ×3 IMPLANT
DRAIN HEMOVAC 7FR (DRAIN) ×4 IMPLANT
DRAPE NEUROLOGICAL W/INCISE (DRAPES) ×3 IMPLANT
DRAPE SURG 17X23 STRL (DRAPES) IMPLANT
DRAPE WARM FLUID 44X44 (DRAPE) ×3 IMPLANT
DRSG TELFA 3X8 NADH (GAUZE/BANDAGES/DRESSINGS) ×3 IMPLANT
DURAPREP 6ML APPLICATOR 50/CS (WOUND CARE) ×3 IMPLANT
ELECT CAUTERY BLADE 6.4 (BLADE) ×3 IMPLANT
ELECT REM PT RETURN 9FT ADLT (ELECTROSURGICAL) ×3
ELECTRODE REM PT RTRN 9FT ADLT (ELECTROSURGICAL) ×1 IMPLANT
EVACUATOR 1/8 PVC DRAIN (DRAIN) IMPLANT
EVACUATOR SILICONE 100CC (DRAIN) ×4 IMPLANT
GAUZE SPONGE 4X4 12PLY STRL (GAUZE/BANDAGES/DRESSINGS) ×3 IMPLANT
GAUZE SPONGE 4X4 16PLY XRAY LF (GAUZE/BANDAGES/DRESSINGS) IMPLANT
GLOVE BIO SURGEON STRL SZ7 (GLOVE) IMPLANT
GLOVE BIOGEL PI IND STRL 7.0 (GLOVE) IMPLANT
GLOVE BIOGEL PI IND STRL 7.5 (GLOVE) ×1 IMPLANT
GLOVE BIOGEL PI INDICATOR 7.0 (GLOVE)
GLOVE BIOGEL PI INDICATOR 7.5 (GLOVE) ×2
GLOVE ECLIPSE 7.0 STRL STRAW (GLOVE) ×8 IMPLANT
GLOVE EXAM NITRILE LRG STRL (GLOVE) IMPLANT
GLOVE EXAM NITRILE XL STR (GLOVE) IMPLANT
GLOVE EXAM NITRILE XS STR PU (GLOVE) IMPLANT
GOWN STRL REUS W/ TWL LRG LVL3 (GOWN DISPOSABLE) ×2 IMPLANT
GOWN STRL REUS W/ TWL XL LVL3 (GOWN DISPOSABLE) IMPLANT
GOWN STRL REUS W/TWL 2XL LVL3 (GOWN DISPOSABLE) IMPLANT
GOWN STRL REUS W/TWL LRG LVL3 (GOWN DISPOSABLE) ×9
GOWN STRL REUS W/TWL XL LVL3 (GOWN DISPOSABLE)
HEMOSTAT POWDER KIT SURGIFOAM (HEMOSTASIS) ×3 IMPLANT
HEMOSTAT SURGICEL 2X14 (HEMOSTASIS) IMPLANT
KIT BASIN OR (CUSTOM PROCEDURE TRAY) ×3 IMPLANT
KIT ROOM TURNOVER OR (KITS) ×3 IMPLANT
MARKER SKIN DUAL TIP RULER LAB (MISCELLANEOUS) ×2 IMPLANT
NDL HYPO 25X1 1.5 SAFETY (NEEDLE) ×1 IMPLANT
NEEDLE HYPO 25X1 1.5 SAFETY (NEEDLE) ×3 IMPLANT
NS IRRIG 1000ML POUR BTL (IV SOLUTION) ×3 IMPLANT
OIL CARTRIDGE MAESTRO DRILL (MISCELLANEOUS) ×3
PACK CRANIOTOMY CUSTOM (CUSTOM PROCEDURE TRAY) ×3 IMPLANT
PAD DRESSING TELFA 3X8 NADH (GAUZE/BANDAGES/DRESSINGS) IMPLANT
PATTIES SURGICAL .5 X.5 (GAUZE/BANDAGES/DRESSINGS) IMPLANT
PATTIES SURGICAL .5 X3 (DISPOSABLE) IMPLANT
PATTIES SURGICAL 1X1 (DISPOSABLE) IMPLANT
PERFORATOR LRG  14-11MM (BIT) ×2
PERFORATOR LRG 14-11MM (BIT) IMPLANT
SET CYSTO W/LG BORE CLAMP LF (SET/KITS/TRAYS/PACK) ×2 IMPLANT
SPONGE NEURO XRAY DETECT 1X3 (DISPOSABLE) IMPLANT
SPONGE SURGIFOAM ABS GEL 100 (HEMOSTASIS) ×3 IMPLANT
STAPLER VISISTAT 35W (STAPLE) ×5 IMPLANT
STOCKINETTE 6  STRL (DRAPES) ×2
STOCKINETTE 6 STRL (DRAPES) ×1 IMPLANT
SUT ETHILON 3 0 FSL (SUTURE) IMPLANT
SUT ETHILON 3 0 PS 1 (SUTURE) IMPLANT
SUT NURALON 4 0 TR CR/8 (SUTURE) ×9 IMPLANT
SUT STEEL 0 (SUTURE)
SUT STEEL 0 18XMFL TIE 17 (SUTURE) IMPLANT
SUT VIC AB 0 CT1 18XCR BRD8 (SUTURE) ×2 IMPLANT
SUT VIC AB 0 CT1 8-18 (SUTURE) ×6
SUT VIC AB 3-0 SH 8-18 (SUTURE) ×6 IMPLANT
SYR CONTROL 10ML LL (SYRINGE) ×6 IMPLANT
TOWEL GREEN STERILE (TOWEL DISPOSABLE) ×3 IMPLANT
TOWEL GREEN STERILE FF (TOWEL DISPOSABLE) ×3 IMPLANT
TRAY FOLEY W/METER SILVER 16FR (SET/KITS/TRAYS/PACK) ×3 IMPLANT
TUBE CONNECTING 12'X1/4 (SUCTIONS) ×1
TUBE CONNECTING 12X1/4 (SUCTIONS) ×2 IMPLANT
UNDERPAD 30X30 (UNDERPADS AND DIAPERS) ×3 IMPLANT
WATER STERILE IRR 1000ML POUR (IV SOLUTION) ×3 IMPLANT

## 2017-03-09 NOTE — Anesthesia Procedure Notes (Signed)
Arterial Line Insertion Start/End2/22/2019 10:45 AM, 03/09/2017 10:50 AM Performed by: [name], MD, Genelle Bal, CRNA, CRNA  Patient location: Pre-op. Preanesthetic checklist: patient identified, IV checked, site marked, risks and benefits discussed, surgical consent, monitors and equipment checked, pre-op evaluation and timeout performed Lidocaine 1% used for infiltration Left, radial was placed Catheter size: 20 G Hand hygiene performed  and maximum sterile barriers used  Allen's test indicative of satisfactory collateral circulation Attempts: 1 Procedure performed without using ultrasound guided technique. Following insertion, Biopatch and dressing applied. Post procedure assessment: normal  Patient tolerated the procedure well with no immediate complications.

## 2017-03-09 NOTE — Anesthesia Procedure Notes (Signed)
Procedure Name: Intubation Date/Time: 03/09/2017 12:53 PM Performed by: Genelle Bal, CRNA Pre-anesthesia Checklist: Patient identified, Emergency Drugs available, Suction available and Patient being monitored Patient Re-evaluated:Patient Re-evaluated prior to induction Oxygen Delivery Method: Circle system utilized Preoxygenation: Pre-oxygenation with 100% oxygen Induction Type: IV induction Ventilation: Mask ventilation without difficulty Laryngoscope Size: Miller and 2 Grade View: Grade I Tube type: Oral Tube size: 7.5 mm Number of attempts: 1 Airway Equipment and Method: Stylet and Oral airway Placement Confirmation: ETT inserted through vocal cords under direct vision,  positive ETCO2 and breath sounds checked- equal and bilateral Secured at: 22 cm Tube secured with: Tape Dental Injury: Teeth and Oropharynx as per pre-operative assessment

## 2017-03-09 NOTE — Anesthesia Postprocedure Evaluation (Signed)
Anesthesia Post Note  Patient: Daniel [name]  Procedure(s) Performed: Haskell Flirt FOR SUBDURAL HEMATOMA BILATERAL (Bilateral Head)     Patient location during evaluation: PACU Anesthesia Type: General Level of consciousness: awake and alert Pain management: pain level controlled Vital Signs Assessment: post-procedure vital signs reviewed and stable Respiratory status: spontaneous breathing, nonlabored ventilation, respiratory function stable and patient connected to nasal cannula oxygen Cardiovascular status: blood pressure returned to baseline and stable Postop Assessment: no apparent nausea or vomiting Anesthetic complications: no    Last Vitals:  Vitals:   03/09/17 1530 03/09/17 1548  BP:  (!) 150/75  Pulse: (!) 58 (!) 59  Resp: 16 15  Temp:  (!) 36.2 C  SpO2: 96% 96%    Last Pain:  Vitals:   03/09/17 0800  TempSrc: Axillary  PainSc:                  [name]

## 2017-03-09 NOTE — Op Note (Signed)
PREOP DIAGNOSIS:  1. Right subdural hematoma 2. Left subdural hematoma  POSTOP DIAGNOSIS: Same  PROCEDURE: 1. Right bur hole evacuation of chronic subdural hematoma 2. Left bur hole evacuation of chronic subdural hematoma  SURGEON: Dr. Consuella Lose, MD  ASSISTANT: Dr. Newman Pies, MD  ANESTHESIA: General Endotracheal  EBL: 50cc  SPECIMENS: None  DRAINS: Bilateral subdural drains  COMPLICATIONS: None immediate  CONDITION: Stable to PACU  HISTORY: Daniel [name] is a 82 y.o. male initially admitted through the emergency department although he has been followed over the last several months for progressively worsening mental status, lethargy, and was found to have bilateral subdural hematomas.  Repeat CT scan done in the emergency department demonstrated progressive enlargement.  He was therefore admitted and presents today for evacuation of bilateral subdural hematomas.  The risks and benefits of the surgery were explained in detail to both the patient and his wife.  All their questions were answered.  PROCEDURE IN DETAIL: After informed consent was obtained and witnessed, the patient was brought to the operating room. After induction of general anesthesia, the patient was positioned on the operative table in the horseshoe head holder in the supine position. All pressure points were meticulously padded.  Bilateral frontoparietal linear skin incisions were then marked out and prepped and draped in the usual sterile fashion.  After timeout was conducted, the left skin incision was infiltrated with local anesthetic. Skin incision was then made sharply, and Bovie electrocautery was used to dissect the subcutaneous tissue and the galea was incised. Hemostasis was achieved on the skin edges with Raney clips.  Periosteum was then elevated, and self-retaining retractors were placed.  2 bur holes were created at the anterior and posterior margins of the right opening.  Dura was then  coagulated with monopolar.  Chronic subdural fluid was then immediately evacuated under some pressure.  The subdural space was then irrigated with approximately 1.5 L of normal saline irrigation until the irrigation ran clear.  Drain was then placed and tunneled subcutaneously.  The galea was then closed with interrupted 3-0 Vicryl stitches.  The skin was closed with skin staples.  Attention was then turned to the right side.  In a similar fashion the incision was infiltrated with local anesthetic with epinephrine.  Incision was then made sharply and carried through the galea.  Raney clips were applied.  Periosteum was elevated and self-retaining retractor was placed.  Again, 2 bur holes were created one anterior and one posterior.  The dura was then coagulated with monopolar.  Subdural fluid was then encountered under some pressure.  This was evacuated, and the subdural space was irrigated with another 1.5 L of normal saline.  Irrigation ran clear.  Subdural drain was then placed and tunneled subcutaneously.  The galea was closed with interrupted 3-0 Vicryl stitches.  The skin was closed with staples.    The drains were secured.  Sterile dressing was applied.  The patient was then extubated, and taken to the postanesthesia care unit in stable hemodynamic condition.  At the end of the case all sponge, needle, instrument, and cottonoid counts were correct.

## 2017-03-09 NOTE — Progress Notes (Signed)
No issues overnight. Pt has no new complaints.  EXAM:  BP 132/74   Pulse (!) 51   Temp (!) 97.4 F (36.3 C) (Axillary)   Resp 15   Ht 6\' 1"  (1.854 m)   Wt 70.3 kg (155 lb)   SpO2 98%   BMI 20.45 kg/m   Awake, alert,  Speech fluent CN grossly intact  Symmetric strength  IMPRESSION:  82 y.o. male with large chronic bilateral SDH, progressive clinical decline over several months  PLAN: - Proceed with bilateral evacuation of SDH  I have previously reviewed in detail the procedure, indications, risks, and benefits with the patient and his wife. All questions were answered.

## 2017-03-09 NOTE — Anesthesia Preprocedure Evaluation (Signed)
Anesthesia Evaluation  Patient identified by MRN, date of birth, ID band Patient awake    Reviewed: Allergy & Precautions, NPO status , Patient's Chart, lab work & pertinent test results  Airway Mallampati: II  TM Distance: >3 FB Neck ROM: Full    Dental no notable dental hx.    Pulmonary former smoker,    Pulmonary exam normal breath sounds clear to auscultation       Cardiovascular hypertension, Pt. on medications Normal cardiovascular exam Rhythm:Regular Rate:Normal     Neuro/Psych dementia negative psych ROS   GI/Hepatic negative GI ROS, Neg liver ROS,   Endo/Other  diabetes, Type 2  Renal/GU negative Renal ROS  negative genitourinary   Musculoskeletal negative musculoskeletal ROS (+)   Abdominal   Peds negative pediatric ROS (+)  Hematology negative hematology ROS (+)   Anesthesia Other Findings   Reproductive/Obstetrics negative OB ROS                             Anesthesia Physical Anesthesia Plan  ASA: III  Anesthesia Plan: General   Post-op Pain Management:    Induction: Intravenous  PONV Risk Score and Plan: 2 and Ondansetron and Treatment may vary due to age or medical condition  Airway Management Planned: Oral ETT  Additional Equipment: Arterial line  Intra-op Plan:   Post-operative Plan: Extubation in OR  Informed Consent: I have reviewed the patients History and Physical, chart, labs and discussed the procedure including the risks, benefits and alternatives for the proposed anesthesia with the patient or authorized representative who has indicated his/her understanding and acceptance.   Dental advisory given  Plan Discussed with: CRNA  Anesthesia Plan Comments:         Anesthesia Quick Evaluation

## 2017-03-09 NOTE — Transfer of Care (Signed)
Immediate Anesthesia Transfer of Care Note  Patient: Daniel [name]  Procedure(s) Performed: Haskell Flirt FOR SUBDURAL HEMATOMA BILATERAL (Bilateral Head)  Patient Location: PACU  Anesthesia Type:General  Level of Consciousness: awake, alert  and oriented  Airway & Oxygen Therapy: Patient Spontanous Breathing and Patient connected to face mask oxygen  Post-op Assessment: Report given to RN and Post -op Vital signs reviewed and stable  Post vital signs: Reviewed and stable  Last Vitals:  Vitals:   03/09/17 0800 03/09/17 0900  BP: 132/74   Pulse: (!) 49 (!) 51  Resp: 16 15  Temp: (!) 36.3 C   SpO2: 98% 98%    Last Pain:  Vitals:   03/09/17 0800  TempSrc: Axillary  PainSc:       Patients Stated Pain Goal: 0 (95/07/22 5750)  Complications: No apparent anesthesia complications

## 2017-03-10 ENCOUNTER — Encounter (HOSPITAL_COMMUNITY): Payer: Self-pay | Admitting: Neurosurgery

## 2017-03-10 ENCOUNTER — Inpatient Hospital Stay (HOSPITAL_COMMUNITY): Payer: PPO

## 2017-03-10 LAB — BASIC METABOLIC PANEL
Anion gap: 12 (ref 5–15)
BUN: 17 mg/dL (ref 6–20)
CO2: 22 mmol/L (ref 22–32)
Calcium: 9.4 mg/dL (ref 8.9–10.3)
Chloride: 102 mmol/L (ref 101–111)
Creatinine, Ser: 0.82 mg/dL (ref 0.61–1.24)
GFR calc Af Amer: >60 mL/min (ref >60)
GFR calc non Af Amer: >60 mL/min (ref >60)
Glucose, Bld: 147 mg/dL — ABNORMAL HIGH (ref 65–99)
Potassium: 4.2 mmol/L (ref 3.5–5.1)
Sodium: 136 mmol/L (ref 135–145)

## 2017-03-10 LAB — CBC
HCT: 42.3 % (ref 39.0–52.0)
Hemoglobin: 13.7 g/dL (ref 13.0–17.0)
MCH: 28.8 pg (ref 26.0–34.0)
MCHC: 32.4 g/dL (ref 30.0–36.0)
MCV: 88.9 fL (ref 78.0–100.0)
Platelets: 264 10*3/uL (ref 150–400)
RBC: 4.76 MIL/uL (ref 4.22–5.81)
RDW: 13.9 % (ref 11.5–15.5)
WBC: 11.8 10*3/uL — ABNORMAL HIGH (ref 4.0–10.5)

## 2017-03-10 LAB — GLUCOSE, CAPILLARY
GLUCOSE-CAPILLARY: 123 mg/dL — AB (ref 65–99)
GLUCOSE-CAPILLARY: 223 mg/dL — AB (ref 65–99)
Glucose-Capillary: 174 mg/dL — ABNORMAL HIGH (ref 65–99)
Glucose-Capillary: 146 mg/dL — ABNORMAL HIGH (ref 65–99)

## 2017-03-10 MED ORDER — PANTOPRAZOLE SODIUM 40 MG PO TBEC
40.0000 mg | DELAYED_RELEASE_TABLET | Freq: Every day | ORAL | Status: DC
  Administered 2017-03-10 – 2017-03-12 (×3): 40 mg via ORAL
  Filled 2017-03-10 (×3): qty 1

## 2017-03-10 NOTE — Progress Notes (Signed)
Patient ID: Daniel [name], male   DOB: Apr 03, 1935, 82 y.o.   MRN: [id number] Subjective: The patient is alert and pleasant.  He is in no apparent distress.  Objective: Vital signs in last 24 hours: Temp:  [96.8 F (36 C)-98 F (36.7 C)] 98 F (36.7 C) (02/23 0400) Pulse Rate:  [55-74] 55 (02/23 0800) Resp:  [11-26] 15 (02/23 0800) BP: (107-163)/(56-99) 140/69 (02/23 0800) SpO2:  [93 %-100 %] 98 % (02/23 0800) Arterial Line BP: (132-177)/(55-97) 132/70 (02/22 2215) Weight:  [70.3 kg (155 lb)] 70.3 kg (155 lb) (02/22 1008) Estimated body mass index is 20.45 kg/m as calculated from the following:   Height as of this encounter: 6\' 1"  (1.854 m).   Weight as of this encounter: 70.3 kg (155 lb).   Intake/Output from previous day: 02/22 0701 - 02/23 0700 In: 2129.2 [P.O.:200; I.V.:1774.2; IV Piggyback:155] Out: 3716 [Urine:1200; Drains:164; Blood:50] Intake/Output this shift: Total I/O In: 10 [I.V.:10] Out: -   Physical exam the patient is alert and oriented.  He is moving all 4 extremities well.  I have reviewed the patient's follow-up head CT.  It demonstrates right greater than left normocephaly.  Lab Results: Recent Labs    03/10/17 0432  WBC 11.8*  HGB 13.7  HCT 42.3  PLT 264   BMET Recent Labs    03/10/17 0432  NA 136  K 4.2  CL 102  CO2 22  GLUCOSE 147*  BUN 17  CREATININE 0.82  CALCIUM 9.4    Studies/Results: Ct Head Wo Contrast  Result Date: 03/10/2017 CLINICAL DATA:  Follow-up evaluation. History of hypertension, hyperlipidemia and diabetes. EXAM: CT HEAD WITHOUT CONTRAST TECHNIQUE: Contiguous axial images were obtained from the base of the skull through the vertex without intravenous contrast. COMPARISON:  CT HEAD February 07, 2017 and March 06, 2016 FINDINGS: BRAIN: Interval evacuation of bilateral holohemispheric subdural hematomas. Large volume RIGHT extra-axial pneumocephalus measuring to 3 cm in transaxial dimension with small residual  heterogeneous collection. 7 mm residual LEFT holohemispheric heterogeneous collection. Bilateral extra-axial drains in place. 4 mm RIGHT to LEFT midline shift. Partially re-expanded lateral ventricles. No hydrocephalus. No intraparenchymal hemorrhage or acute large vascular territory infarct. Basal cisterns are patent. VASCULAR: Mild calcific atherosclerosis of the carotid siphons. SKULL: New bilateral frontal burr holes. Mild scalp soft tissue swelling with skin staples. SINUSES/ORBITS: The mastoid air-cells and included paranasal sinuses are well-aerated.The included ocular globes and orbital contents are non-suspicious. Status post bilateral ocular lens implants. OTHER: None. IMPRESSION: 1. Status post evacuation of bilateral subdural hematomas with small residual mixed density components, surgical drains in place. 2. New large volume RIGHT extra-axial pneumocephalus measuring to 3 cm transaxial dimension concerning for tension pneumocephalus. 3 mm RIGHT to LEFT midline shift. 3. These results will be called to the ordering clinician or representative by the Radiologist Assistant, and communication documented in the PACS or zVision Dashboard. Electronically Signed   By: Elon Alas M.D.   On: 03/10/2017 04:30    Assessment/Plan: Postop day #1: The patient is doing well clinically.  We have stopped his JP suction.  We will likely remove the drains tomorrow.  LOS: 4 days     Ophelia Charter 03/10/2017, 9:27 AM

## 2017-03-10 NOTE — Progress Notes (Signed)
Radiologist called to discuss result of AM CT exam. Neurosurgeon on call made aware of scan.

## 2017-03-11 LAB — GLUCOSE, CAPILLARY
GLUCOSE-CAPILLARY: 131 mg/dL — AB (ref 65–99)
Glucose-Capillary: 144 mg/dL — ABNORMAL HIGH (ref 65–99)

## 2017-03-11 MED ORDER — LEVETIRACETAM 500 MG PO TABS
500.0000 mg | ORAL_TABLET | Freq: Two times a day (BID) | ORAL | Status: DC
  Administered 2017-03-11 – 2017-03-13 (×5): 500 mg via ORAL
  Filled 2017-03-11 (×5): qty 1

## 2017-03-11 NOTE — Progress Notes (Signed)
Rehab Admissions Coordinator Note:  Patient was screened by Cleatrice Burke for appropriateness for an Inpatient Acute Rehab Consult per PT recommendation.  At this time, we are recommending Inpatient Rehab consult.  Cleatrice Burke 03/11/2017, 5:24 PM  I can be reached at [phone].

## 2017-03-11 NOTE — Progress Notes (Signed)
Patient ID: Daniel [name], male   DOB: 20-Jan-1935, 82 y.o.   MRN: [id number] Subjective:  the patient is alert and pleasant. He is in no apparent distress.  Objective: Vital signs in last 24 hours: Temp:  [97.6 F (36.4 C)-99.1 F (37.3 C)] 99.1 F (37.3 C) (02/24 0751) Pulse Rate:  [54-70] 64 (02/24 0830) Resp:  [13-33] 14 (02/24 0830) BP: (117-180)/(54-96) 150/79 (02/24 0830) SpO2:  [93 %-100 %] 97 % (02/24 0830) Estimated body mass index is 20.45 kg/m as calculated from the following:   Height as of this encounter: 6\' 1"  (1.854 m).   Weight as of this encounter: 70.3 kg (155 lb).   Intake/Output from previous day: 02/23 0701 - 02/24 0700 In: 1745.8 [P.O.:1300; I.V.:235.8; IV Piggyback:210] Out: 2764 [Urine:2750; Drains:14] Intake/Output this shift: Total I/O In: 10 [I.V.:10] Out: -   Physical exam the patient is alert and oriented. His speech is normal. He is moving all 4 extremities well.  I removed the subdural drains.  Lab Results: Recent Labs    03/10/17 0432  WBC 11.8*  HGB 13.7  HCT 42.3  PLT 264   BMET Recent Labs    03/10/17 0432  NA 136  K 4.2  CL 102  CO2 22  GLUCOSE 147*  BUN 17  CREATININE 0.82  CALCIUM 9.4    Studies/Results: Ct Head Wo Contrast  Result Date: 03/10/2017 CLINICAL DATA:  Follow-up evaluation. History of hypertension, hyperlipidemia and diabetes. EXAM: CT HEAD WITHOUT CONTRAST TECHNIQUE: Contiguous axial images were obtained from the base of the skull through the vertex without intravenous contrast. COMPARISON:  CT HEAD February 07, 2017 and March 06, 2016 FINDINGS: BRAIN: Interval evacuation of bilateral holohemispheric subdural hematomas. Large volume RIGHT extra-axial pneumocephalus measuring to 3 cm in transaxial dimension with small residual heterogeneous collection. 7 mm residual LEFT holohemispheric heterogeneous collection. Bilateral extra-axial drains in place. 4 mm RIGHT to LEFT midline shift. Partially re-expanded  lateral ventricles. No hydrocephalus. No intraparenchymal hemorrhage or acute large vascular territory infarct. Basal cisterns are patent. VASCULAR: Mild calcific atherosclerosis of the carotid siphons. SKULL: New bilateral frontal burr holes. Mild scalp soft tissue swelling with skin staples. SINUSES/ORBITS: The mastoid air-cells and included paranasal sinuses are well-aerated.The included ocular globes and orbital contents are non-suspicious. Status post bilateral ocular lens implants. OTHER: None. IMPRESSION: 1. Status post evacuation of bilateral subdural hematomas with small residual mixed density components, surgical drains in place. 2. New large volume RIGHT extra-axial pneumocephalus measuring to 3 cm transaxial dimension concerning for tension pneumocephalus. 3 mm RIGHT to LEFT midline shift. 3. These results will be called to the ordering clinician or representative by the Radiologist Assistant, and communication documented in the PACS or zVision Dashboard. Electronically Signed   By: Elon Alas M.D.   On: 03/10/2017 04:30    Assessment/Plan: Postop day #2: The patient is doing well.  LOS: 5 days     Ophelia Charter 03/11/2017, 9:59 AM

## 2017-03-11 NOTE — Evaluation (Signed)
Physical Therapy Evaluation Patient Details Name: Daniel [name] MRN: [id number] DOB: [birth year] Today's Date: 03/11/2017   History of Present Illness  82 y.o. male with large chronic bilateral SDH, progressive clinical decline over several months  Clinical Impression  Orders received for PT evaluation. Patient demonstrates deficits in functional mobility as indicated below. Will benefit from continued skilled PT to address deficits and maximize function. Will see as indicated and progress as tolerated.  At this time, feel patient would benefit from comprehensive inpatient therapies to maximize functional recovery for independence. 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     Follow Up Recommendations CIR;Supervision/Assistance - 24 hour    Equipment Recommendations  (TBD)    Recommendations for Other Services Rehab consult     Precautions / Restrictions Precautions Precautions: Fall      Mobility  Bed Mobility Overal bed mobility: Needs Assistance Bed Mobility: Rolling;Sidelying to Sit Rolling: Min assist Sidelying to sit: Mod assist       General bed mobility comments: min assist to roll to sidelying, moderate assist to elevate trunk to upright position at EOB, patient limited by dizziness with transitional movement  Transfers Overall transfer level: Needs assistance Equipment used: 1 person hand held assist Transfers: Sit to/from [facility] Sit to Stand: Mod assist Stand pivot transfers: Mod assist       General transfer comment: moderate assist to power to upright and provide stability. Initially positioned with wrap around assist but transitioned to face to face prior to pivotal steps to chair. Patient able to take festinating steps to chair with increased physical assist as well as increased time and effort  Ambulation/Gait             General Gait Details: shuffling steps to chair during  pivot  Stairs            Wheelchair Mobility    Modified Rankin (Stroke Patients Only) Modified Rankin (Stroke Patients Only) Pre-Morbid Rankin Score: No symptoms Modified Rankin: Moderately severe disability     Balance Overall balance assessment: Needs assistance   Sitting balance-Leahy Scale: Fair Sitting balance - Comments: able to sit at EOB and perform some dynamic tasks without assist     Standing balance-Leahy Scale: Poor Standing balance comment: required support for stability in upright position                             Pertinent Vitals/Pain Pain Assessment: No/denies pain    Home Living Family/patient expects to be discharged to:: Private residence Living Arrangements: Spouse/significant other;Children Available Help at Discharge: Family Type of Home: House Home Access: Stairs to enter Entrance Stairs-Rails: Right Entrance Stairs-Number of Steps: 2 Home Layout: Multi-level Home Equipment: Shower seat - built in;Grab bars - tub/shower      Prior Function Level of Independence: Independent               Hand Dominance        Extremity/Trunk Assessment   Upper Extremity Assessment Upper Extremity Assessment: Defer to OT evaluation    Lower Extremity Assessment Lower Extremity Assessment: Generalized weakness;RLE deficits/detail;LLE deficits/detail RLE Coordination: decreased fine motor;decreased gross motor LLE Coordination: decreased fine motor;decreased gross motor       Communication   Communication: HOH  Cognition Arousal/Alertness: Awake/alert Behavior During Therapy: Flat affect(tangential) Overall Cognitive Status: History of cognitive impairments - at baseline Area of Impairment: Memory;Problem solving;Attention  Current Attention Level: Selective Memory: Decreased short-term memory       Problem Solving: Slow processing;Requires verbal cues;Requires tactile cues;Difficulty  sequencing        General Comments      Exercises     Assessment/Plan    PT Assessment Patient needs continued PT services  PT Problem List Decreased strength;Decreased activity tolerance;Decreased balance;Decreased mobility;Decreased coordination;Decreased cognition       PT Treatment Interventions DME instruction;Gait training;Stair training;Functional mobility training;Therapeutic activities;Therapeutic exercise;Balance training;Neuromuscular re-education;Cognitive remediation;Patient/family education    PT Goals (Current goals can be found in the Care Plan section)  Acute Rehab PT Goals Patient Stated Goal: to get home PT Goal Formulation: With patient Time For Goal Achievement: 03/25/17 Potential to Achieve Goals: Good    Frequency Min 4X/week   Barriers to discharge        Co-evaluation               AM-PAC PT "6 Clicks" Daily Activity  Outcome Measure Difficulty turning over in bed (including adjusting bedclothes, sheets and blankets)?: Unable Difficulty moving from lying on back to sitting on the side of the bed? : Unable Difficulty sitting down on and standing up from a chair with arms (e.g., wheelchair, bedside commode, etc,.)?: Unable Help needed moving to and from a bed to chair (including a wheelchair)?: A Lot Help needed walking in hospital room?: A Lot Help needed climbing 3-5 steps with a railing? : A Lot 6 Click Score: 9    End of Session Equipment Utilized During Treatment: Gait belt Activity Tolerance: Patient tolerated treatment well;Other (comment)(limited by dizziness with transition to EOB and then upright) Patient left: in chair;with call bell/phone within reach;with nursing/sitter in room Nurse Communication: Mobility status PT Visit Diagnosis: Other abnormalities of gait and mobility (R26.89);Muscle weakness (generalized) (M62.81);Difficulty in walking, not elsewhere classified (R26.2);Other symptoms and signs involving the nervous  system (R29.898)    Time: 7681-1572 PT Time Calculation (min) (ACUTE ONLY): 22 min   Charges:   PT Evaluation $PT Eval Moderate Complexity: 1 Mod     PT G Codes:        Alben Deeds, PT DPT  Board Certified Neurologic Specialist [place] 03/11/2017, 3:41 PM

## 2017-03-11 NOTE — Progress Notes (Signed)
Bilateral JP drains removed by Dr. Arnoldo Morale. He closed the sites with one staple on each side. There was minimal serosanguinous drainage. Pt tolerated procedure well.

## 2017-03-12 ENCOUNTER — Other Ambulatory Visit: Payer: Self-pay

## 2017-03-12 DIAGNOSIS — E119 Type 2 diabetes mellitus without complications: Secondary | ICD-10-CM

## 2017-03-12 DIAGNOSIS — T380X5A Adverse effect of glucocorticoids and synthetic analogues, initial encounter: Secondary | ICD-10-CM

## 2017-03-12 DIAGNOSIS — G309 Alzheimer's disease, unspecified: Secondary | ICD-10-CM

## 2017-03-12 DIAGNOSIS — G960 Cerebrospinal fluid leak: Secondary | ICD-10-CM

## 2017-03-12 DIAGNOSIS — I10 Essential (primary) hypertension: Secondary | ICD-10-CM

## 2017-03-12 DIAGNOSIS — F0280 Dementia in other diseases classified elsewhere without behavioral disturbance: Secondary | ICD-10-CM

## 2017-03-12 DIAGNOSIS — D72829 Elevated white blood cell count, unspecified: Secondary | ICD-10-CM

## 2017-03-12 DIAGNOSIS — R0682 Tachypnea, not elsewhere classified: Secondary | ICD-10-CM

## 2017-03-12 DIAGNOSIS — R739 Hyperglycemia, unspecified: Secondary | ICD-10-CM

## 2017-03-12 LAB — GLUCOSE, CAPILLARY
GLUCOSE-CAPILLARY: 155 mg/dL — AB (ref 65–99)
GLUCOSE-CAPILLARY: 188 mg/dL — AB (ref 65–99)
Glucose-Capillary: 194 mg/dL — ABNORMAL HIGH (ref 65–99)
Glucose-Capillary: 190 mg/dL — ABNORMAL HIGH (ref 65–99)
Glucose-Capillary: 156 mg/dL — ABNORMAL HIGH (ref 65–99)
Glucose-Capillary: 214 mg/dL — ABNORMAL HIGH (ref 65–99)

## 2017-03-12 NOTE — Consult Note (Signed)
Physical Medicine and Rehabilitation Consult Reason for Consult: Decreased functional mobility Referring Physician: Dr. Kathyrn Sheriff   HPI: Daniel [name] is a 82 y.o. right handed male with history of Alzheimer's disease maintained on Exelon patch, diabetes mellitus, hypertension as well as history of subdural hygroma that has been monitored since November 2018.  History taken from chart review and patient.  Patient lives with spouse and son. Multiple level home. 2 steps to entry. Reported to be independent prior to admission. Presented 03/06/2017 for shuffling gait instability and generalized decline over 10-14 days as well as cognitive decline. Follow-up cranial CT scan reviewed, showing bilateral SDH.  Per report, bilateral acute on chronic subdural hematomas with more prominent acute component in the left subdural hematoma compared to prior exam. There was a 4 mm left to right midline shift. Underwent bilateral burr hole evacuation of chronic subdural hematoma 03/09/2017 per Dr. Kathyrn Sheriff. Follow-up CT scan 03/10/2017 showed new large volume right extra-axial pneumocephalus measuring 3 cm transaxial dimension concerning for tension pneumocephalus with follow-up cranial CT scan pending 03/14/2017. Keppra for seizure prophylaxis. Tolerating a regular diet. Physical therapy evaluation completed with recommendations of physical medicine rehabilitation consult.   Review of Systems  Constitutional: Negative for chills and fever.  HENT: Negative for hearing loss.   Eyes: Negative for blurred vision and double vision.  Respiratory: Negative for cough and shortness of breath.   Cardiovascular: Negative for chest pain, palpitations and leg swelling.  Gastrointestinal: Positive for constipation. Negative for nausea and vomiting.       GERD  Genitourinary: Positive for urgency. Negative for dysuria, flank pain and hematuria.  Musculoskeletal: Positive for myalgias.  Skin: Negative for rash.    Neurological:       Gait instability  Psychiatric/Behavioral: Positive for memory loss.  All other systems reviewed and are negative.  Past Medical History:  Diagnosis Date  . Alzheimer disease   . Arthritis   . Cancer (HCC)    Skin  . Diabetes mellitus without complication ([place])   . GERD (gastroesophageal reflux disease)   . Hyperlipidemia   . Hypertension    Past Surgical History:  Procedure Laterality Date  . BACK SURGERY    . BURR HOLE Bilateral 03/09/2017   Procedure: BURR HOLES FOR SUBDURAL HEMATOMA BILATERAL;  Surgeon: Consuella Lose, MD;  Location: [facility];  Service: Neurosurgery;  Laterality: Bilateral;  . CATARACT EXTRACTION Bilateral 1987 89  . ROTATOR CUFF REPAIR Left 2003   Family History  Problem Relation Age of Onset  . Alzheimer's disease Mother   . Alzheimer's disease Maternal Grandfather    Social History:  reports that he quit smoking about 39 years ago. he has never used smokeless tobacco. He reports that he drinks alcohol. He reports that he does not use drugs. Allergies:  Allergies  Allergen Reactions  . Donepezil Diarrhea  . Tape Rash    PAPER TAPE IS PREFERRED, PLEASE   Medications Prior to Admission  Medication Sig Dispense Refill  . Alpha Lipoic Acid 200 MG CAPS Take 200 mg by mouth daily.     Marland Kitchen atorvastatin (LIPITOR) 40 MG tablet Take 40 mg by mouth daily at 6 (six) AM.     . celecoxib (CELEBREX) 200 MG capsule Take 200 mg by mouth daily.     . Cholecalciferol ([employer] D3) 2000 units CAPS Take 2,000 Units by mouth daily.     Marland Kitchen ezetimibe (ZETIA) 10 MG tablet Take 10 mg by mouth every evening.   3  .  LYRICA 50 MG capsule Take 50 mg by mouth 2 (two) times daily.   3  . metFORMIN (GLUCOPHAGE) 500 MG tablet Take 500 mg by mouth 2 (two) times daily.  2  . Multiple Minerals-Vitamins (CALCIUM-MAGNESIUM-ZINC-D3) TABS Take 1 tablet by mouth daily.     . Multiple Vitamin (MULTIVITAMIN WITH MINERALS) TABS Take 1 tablet by mouth daily.    . naproxen sodium  (ALEVE) 220 MG tablet Take 440 mg by mouth 2 (two) times daily as needed (for lower back pain).    . pantoprazole (PROTONIX) 40 MG tablet Take 40 mg by mouth daily before breakfast.     . ramipril (ALTACE) 2.5 MG capsule Take 2.5 mg by mouth daily.     . rivastigmine (EXELON) 9.5 mg/24hr Place 1 patch (9.5 mg total) onto the skin daily. (Patient taking differently: Place 9.5 mg onto the skin daily. 0700) 30 patch 11  . sodium chloride (OCEAN) 0.65 % SOLN nasal spray Place 1 spray into both nostrils every morning.      Home: Home Living Family/patient expects to be discharged to:: Private residence Living Arrangements: Spouse/significant other, Children Available Help at Discharge: Family Type of Home: House Home Access: Stairs to enter Technical brewer of Steps: 2 Entrance Stairs-Rails: Right Home Layout: Multi-level Alternate Level Stairs-Number of Steps: 8 Alternate Level Stairs-Rails: Left Bathroom Shower/Tub: Tub/shower unit, Multimedia programmer: Handicapped height Home Equipment: Shower seat - built in, Museum/gallery curator bars - tub/shower  Functional History: Prior Function Level of Independence: Independent Functional Status:  Mobility: Bed Mobility Overal bed mobility: Needs Assistance Bed Mobility: Rolling, Sidelying to Sit Rolling: Min assist Sidelying to sit: Mod assist General bed mobility comments: min assist to roll to sidelying, moderate assist to elevate trunk to upright position at EOB, patient limited by dizziness with transitional movement Transfers Overall transfer level: Needs assistance Equipment used: 1 person hand held assist Transfers: Sit to/from Stand, Stand Pivot Transfers Sit to Stand: Mod assist Stand pivot transfers: Mod assist General transfer comment: moderate assist to power to upright and provide stability. Initially positioned with wrap around assist but transitioned to face to face prior to pivotal steps to chair. Patient able to take  festinating steps to chair with increased physical assist as well as increased time and effort Ambulation/Gait General Gait Details: shuffling steps to chair during pivot    ADL:    Cognition: Cognition Overall Cognitive Status: History of cognitive impairments - at baseline Orientation Level: Oriented X4 Cognition Arousal/Alertness: Awake/alert Behavior During Therapy: Flat affect(tangential) Overall Cognitive Status: History of cognitive impairments - at baseline Area of Impairment: Memory, Problem solving, Attention Current Attention Level: Selective Memory: Decreased short-term memory Problem Solving: Slow processing, Requires verbal cues, Requires tactile cues, Difficulty sequencing  Blood pressure (!) 150/78, pulse 68, temperature 98 F (36.7 C), temperature source Axillary, resp. rate 18, height 6\' 1"  (1.854 m), weight 70.3 kg (155 lb), SpO2 92 %. Physical Exam  Vitals reviewed. Constitutional: He is oriented to person, place, and time. He appears well-developed and well-nourished.  HENT:  Bilateral burr hole sites are dressed  Eyes: EOM are normal. Right eye exhibits no discharge. Left eye exhibits no discharge.  Neck: Normal range of motion. Neck supple. No thyromegaly present.  Cardiovascular: Normal rate, regular rhythm and normal heart sounds.  Respiratory: Effort normal and breath sounds normal. No respiratory distress.  GI: Soft. Bowel sounds are normal. He exhibits no distension.  Musculoskeletal:  No edema or tenderness in extremities  Neurological: He is alert  and oriented to person, place, and time.  Makes good eye contact with examiner.  Follows simple commands.  He is frustrated over memory loss. Motor: 4+/5 grossly throughout  Skin:  Bilateral scalp incisions with dried blood in dressing.  Psychiatric: He has a normal mood and affect. His behavior is normal. Thought content normal.    Results for orders placed or performed during the hospital  encounter of 03/06/17 (from the past 24 hour(s))  Glucose, capillary     Status: Abnormal   Collection Time: 03/11/17  7:49 AM  Result Value Ref Range   Glucose-Capillary 131 (H) 65 - 99 mg/dL   Comment 1 Notify RN    Comment 2 Document in Chart   Glucose, capillary     Status: Abnormal   Collection Time: 03/11/17 12:20 PM  Result Value Ref Range   Glucose-Capillary 144 (H) 65 - 99 mg/dL   Comment 1 Notify RN    Comment 2 Document in Chart    No results found.  Assessment/Plan: Diagnosis: Bilateral subdural hematomas with midline shift Labs and images independently reviewed.  Records reviewed and summated above.  1. Does the need for close, 24 hr/day medical supervision in concert with the patient's rehab needs make it unreasonable for this patient to be served in a less intensive setting? Yes  2. Co-Morbidities requiring supervision/potential complications: subdural hygroma, Alzheimer's disease (continue meds), steroid-induced hyperglycemia on DM (Monitor in accordance with exercise and adjust meds as necessary), HTN (monitor and provide prns in accordance with increased physical exertion and pain), tachypnea (monitor RR and O2 Sats with increased physical exertion), leukocytosis (cont to monitor for signs and symptoms of infection, further workup if indicated) 3. Due to safety, skin/wound care, disease management, pain management and patient education, does the patient require 24 hr/day rehab nursing? Yes 4. Does the patient require coordinated care of a physician, rehab nurse, PT (1-2 hrs/day, 5 days/week), OT (1-2 hrs/day, 5 days/week) and SLP (1-2 hrs/day, 5 days/week) to address physical and functional deficits in the context of the above medical diagnosis(es)? Yes Addressing deficits in the following areas: balance, endurance, locomotion, strength, transferring, bathing, dressing, toileting, cognition and psychosocial support 5. Can the patient actively participate in an intensive  therapy program of at least 3 hrs of therapy per day at least 5 days per week? Yes 6. The potential for patient to make measurable gains while on inpatient rehab is excellent 7. Anticipated functional outcomes upon discharge from inpatient rehab are supervision  with PT, supervision with OT, supervision with SLP. 8. Estimated rehab length of stay to reach the above functional goals is: 10-14 days. 9. Anticipated D/C setting: Home 10. Anticipated post D/C treatments: HH therapy and Home excercise program 11. Overall Rehab/Functional Prognosis: good  RECOMMENDATIONS: This patient's condition is appropriate for continued rehabilitative care in the following setting: CIR pending completion of medical workup if pt agreeable (currently feels he is close to baseline).   Patient has agreed to participate in recommended program. Potentially Note that insurance prior authorization may be required for reimbursement for recommended care.  Comment: Rehab Admissions Coordinator to follow up.  Delice Lesch, MD, ABPMR Lavon Paganini Angiulli, PA-C 03/12/2017

## 2017-03-12 NOTE — Progress Notes (Signed)
Patient ID: Daniel [name], male   DOB: [birth year], 82 y.o.   MRN: [id number] Vital signs are good Incisions clean and dry Patient notes difficulty with bowels, constipation. Rehab is evaluating him Post op CT looks good No drains in place no shift. Plan for rehab.

## 2017-03-12 NOTE — Progress Notes (Signed)
I met with Daniel [name] and his wife at bedside. Daniel [name] not at baseline per wife's assessment. She would like an inpt rehab admit rather than SNF. We also discussed with Dr. Ellene Route. I will begin insurance authorization for a possible admit pending insurance approval. [phone]

## 2017-03-12 NOTE — Evaluation (Signed)
Occupational Therapy Evaluation Patient Details Name: Daniel [name] MRN: [id number] DOB: [birth year] Today's Date: 03/12/2017    History of Present Illness This 82 y.o. male with chronic bil. subdural hygromas admitted with shuffling gait and 10-14 day cognitive decline.  CT of head showed bil. acute on chronic SDH with more prominant acute component in the left SDH compared to previous exam.   He underwent bil. Burr hole evacuation.  PMH includes:  Alzheimer's, DM, CA, s/p lumbar surgery.    Clinical Impression   Pt admitted with above. He demonstrates the below listed deficits and will benefit from continued OT to maximize safety and independence with BADLs.  Pt presents to OT with impaired balance, decreased activity tolerance, generalized weakness, and impaired cognition.  He currently requires min A - max A for ADLs.  He lives with wife who is very supportive.  He was mod I with ADLs and functional mobility PTA.  Feel he would benefit from the consistency and intensity of comprehensive inpatient rehab program to allow him to maximize safety and independence with ADLs, reduce risks of falls, and reduce risk of readmission.  Will follow acutely.        Follow Up Recommendations  CIR;Supervision/Assistance - 24 hour    Equipment Recommendations  None recommended by OT    Recommendations for Other Services Rehab consult     Precautions / Restrictions Precautions Precautions: Fall      Mobility Bed Mobility Overal bed mobility: Needs Assistance Bed Mobility: Supine to Sit Rolling: Min assist         General bed mobility comments: assist to lift trunk   Transfers Overall transfer level: Needs assistance Equipment used: 2 person hand held assist Transfers: Sit to/from Stand;Stand Pivot Transfers Sit to Stand: Min assist;+2 safety/equipment Stand pivot transfers: Min assist;+2 safety/equipment       General transfer comment: assist to steady.  Pt stood x 5 times for peri  care.  He tolerated standing from 15-45" before becoming anxious and returning to sit     Balance Overall balance assessment: Needs assistance Sitting-balance support: Feet supported Sitting balance-Leahy Scale: Fair Sitting balance - Comments: maintains static sitting with min guard assist    Standing balance support: Bilateral upper extremity supported;During functional activity Standing balance-Leahy Scale: Poor Standing balance comment: requires assist for balance                            ADL either performed or assessed with clinical judgement   ADL Overall ADL's : Needs assistance/impaired Eating/Feeding: Independent   Grooming: Wash/dry hands;Wash/dry face;Oral care;Brushing hair;Set up;Sitting   Upper Body Bathing: Minimal assistance;Sitting   Lower Body Bathing: Moderate assistance;Sit to/from stand   Upper Body Dressing : Moderate assistance;Sitting   Lower Body Dressing: Total assistance;Sit to/from stand   Toilet Transfer: Minimal assistance;+2 for physical assistance;Stand-pivot;BSC Toilet Transfer Details (indicate cue type and reason): assist to steady  Toileting- Clothing Manipulation and Hygiene: Total assistance;Sit to/from stand Toileting - Clothing Manipulation Details (indicate cue type and reason): Pt fatigues quickly with standing, and unable to [employer] for peri care and clothing manipulation      Functional mobility during ADLs: Minimal assistance;+2 for physical assistance General ADL Comments: Pt fixated on need to have BM.        Vision Baseline Vision/History: Wears glasses Wears Glasses: At all times Patient Visual Report: No change from baseline Vision Assessment?: No apparent visual deficits Additional Comments: Pt denies visual  changes (would bnefit from further eval )     Perception Perception Perception Tested?: Yes   Praxis Praxis Praxis tested?: Within functional limits    Pertinent Vitals/Pain Pain Assessment:  No/denies pain     Hand Dominance Right   Extremity/Trunk Assessment Upper Extremity Assessment Upper Extremity Assessment: Generalized weakness   Lower Extremity Assessment Lower Extremity Assessment: Defer to PT evaluation   Cervical / Trunk Assessment Cervical / Trunk Assessment: Kyphotic   Communication Communication Communication: HOH   Cognition Arousal/Alertness: Awake/alert Behavior During Therapy: Anxious Overall Cognitive Status: History of cognitive impairments - at baseline                                 General Comments: Pt anxious and self distracts over need for bowel movement.   MD and PT who are familiar with pt, indicate he is at or close to his cognitive baseline    General Comments  VSs     Exercises     Shoulder Instructions      Home Living Family/patient expects to be discharged to:: Private residence Living Arrangements: Spouse/significant other;Children Available Help at Discharge: Family Type of Home: House Home Access: Stairs to enter Technical brewer of Steps: 2 Entrance Stairs-Rails: Right Home Layout: Multi-level Alternate Level Stairs-Number of Steps: 8 Alternate Level Stairs-Rails: Left Bathroom Shower/Tub: Tub/shower unit;Walk-in shower   Bathroom Toilet: Handicapped height     Home Equipment: Shower seat - built in;Grab bars - tub/shower          Prior Functioning/Environment Level of Independence: Independent with assistive device(s)        Comments: pt reports he occasionally used a SPC         OT Problem List: Decreased strength;Decreased activity tolerance;Impaired balance (sitting and/or standing);Decreased cognition;Decreased safety awareness;Decreased knowledge of use of DME or AE      OT Treatment/Interventions: Self-care/ADL training;Neuromuscular education;DME and/or AE instruction;Therapeutic activities;Cognitive remediation/compensation;Patient/family education;Balance  training;Visual/perceptual remediation/compensation    OT Goals(Current goals can be found in the care plan section) Acute Rehab OT Goals Patient Stated Goal: to get home OT Goal Formulation: With patient Time For Goal Achievement: 03/26/17 Potential to Achieve Goals: Good ADL Goals Pt Will Perform Grooming: with min assist;standing Pt Will Perform Upper Body Bathing: with set-up;sitting Pt Will Perform Lower Body Bathing: with min assist;sit to/from stand Pt Will Perform Upper Body Dressing: with set-up;sitting Pt Will Perform Lower Body Dressing: with min assist;sit to/from stand Pt Will Transfer to Toilet: with min assist;ambulating;regular height toilet;bedside commode;grab bars Pt Will Perform Toileting - Clothing Manipulation and hygiene: with min assist;sit to/from stand Pt Will Perform Tub/Shower Transfer: Shower transfer;ambulating;shower seat;grab bars;rolling walker  OT Frequency: Min 2X/week   Barriers to D/C:            Co-evaluation              AM-PAC PT "6 Clicks" Daily Activity     Outcome Measure Help from another person eating meals?: None Help from another person taking care of personal grooming?: A Little Help from another person toileting, which includes using toliet, bedpan, or urinal?: Total Help from another person bathing (including washing, rinsing, drying)?: A Lot Help from another person to put on and taking off regular upper body clothing?: A Lot Help from another person to put on and taking off regular lower body clothing?: A Lot 6 Click Score: 14   End of Session Nurse Communication: Mobility status  Activity Tolerance: Patient tolerated treatment well Patient left: in chair;with call bell/phone within reach;with chair alarm set  OT Visit Diagnosis: Unsteadiness on feet (R26.81);Cognitive communication deficit (R41.841) Symptoms and signs involving cognitive functions: Nontraumatic SAH                Time: 9396-8864 OT Time Calculation  (min): 36 min Charges:  OT General Charges $OT Visit: 1 Visit OT Evaluation $OT Eval Moderate Complexity: 1 Mod G-Codes:     [facility], OTR/L [phone]   Lucille Passy M 03/12/2017, 5:03 PM

## 2017-03-13 ENCOUNTER — Inpatient Hospital Stay (HOSPITAL_COMMUNITY)
Admission: RE | Admit: 2017-03-13 | Discharge: 2017-03-28 | DRG: 057 | Disposition: A | Discharge status: Skilled Nursing Facility | Payer: PPO | Source: Intra-hospital | Attending: Physical Medicine & Rehabilitation | Admitting: Physical Medicine & Rehabilitation

## 2017-03-13 ENCOUNTER — Other Ambulatory Visit: Payer: Self-pay

## 2017-03-13 DIAGNOSIS — G309 Alzheimer's disease, unspecified: Secondary | ICD-10-CM | POA: Diagnosis present

## 2017-03-13 DIAGNOSIS — G9389 Other specified disorders of brain: Secondary | ICD-10-CM | POA: Diagnosis not present

## 2017-03-13 DIAGNOSIS — N401 Enlarged prostate with lower urinary tract symptoms: Secondary | ICD-10-CM | POA: Diagnosis not present

## 2017-03-13 DIAGNOSIS — F0150 Vascular dementia without behavioral disturbance: Secondary | ICD-10-CM | POA: Diagnosis not present

## 2017-03-13 DIAGNOSIS — R32 Unspecified urinary incontinence: Secondary | ICD-10-CM | POA: Diagnosis not present

## 2017-03-13 DIAGNOSIS — K5901 Slow transit constipation: Secondary | ICD-10-CM

## 2017-03-13 DIAGNOSIS — Z298 Encounter for other specified prophylactic measures: Secondary | ICD-10-CM

## 2017-03-13 DIAGNOSIS — D62 Acute posthemorrhagic anemia: Secondary | ICD-10-CM | POA: Diagnosis not present

## 2017-03-13 DIAGNOSIS — G9340 Encephalopathy, unspecified: Secondary | ICD-10-CM | POA: Diagnosis present

## 2017-03-13 DIAGNOSIS — E569 Vitamin deficiency, unspecified: Secondary | ICD-10-CM | POA: Diagnosis not present

## 2017-03-13 DIAGNOSIS — I69298 Other sequelae of other nontraumatic intracranial hemorrhage: Principal | ICD-10-CM

## 2017-03-13 DIAGNOSIS — Z7984 Long term (current) use of oral hypoglycemic drugs: Secondary | ICD-10-CM | POA: Diagnosis not present

## 2017-03-13 DIAGNOSIS — Z111 Encounter for screening for respiratory tuberculosis: Secondary | ICD-10-CM | POA: Diagnosis not present

## 2017-03-13 DIAGNOSIS — D72829 Elevated white blood cell count, unspecified: Secondary | ICD-10-CM | POA: Diagnosis not present

## 2017-03-13 DIAGNOSIS — M6281 Muscle weakness (generalized): Secondary | ICD-10-CM | POA: Diagnosis not present

## 2017-03-13 DIAGNOSIS — E785 Hyperlipidemia, unspecified: Secondary | ICD-10-CM | POA: Diagnosis present

## 2017-03-13 DIAGNOSIS — Z23 Encounter for immunization: Secondary | ICD-10-CM | POA: Diagnosis not present

## 2017-03-13 DIAGNOSIS — L89322 Pressure ulcer of left buttock, stage 2: Secondary | ICD-10-CM | POA: Diagnosis present

## 2017-03-13 DIAGNOSIS — G9349 Other encephalopathy: Secondary | ICD-10-CM | POA: Diagnosis present

## 2017-03-13 DIAGNOSIS — R413 Other amnesia: Secondary | ICD-10-CM | POA: Diagnosis not present

## 2017-03-13 DIAGNOSIS — R0989 Other specified symptoms and signs involving the circulatory and respiratory systems: Secondary | ICD-10-CM | POA: Diagnosis not present

## 2017-03-13 DIAGNOSIS — S065X9A Traumatic subdural hemorrhage with loss of consciousness of unspecified duration, initial encounter: Secondary | ICD-10-CM | POA: Diagnosis present

## 2017-03-13 DIAGNOSIS — K5900 Constipation, unspecified: Secondary | ICD-10-CM | POA: Diagnosis not present

## 2017-03-13 DIAGNOSIS — I6200 Nontraumatic subdural hemorrhage, unspecified: Secondary | ICD-10-CM | POA: Diagnosis not present

## 2017-03-13 DIAGNOSIS — R05 Cough: Secondary | ICD-10-CM | POA: Diagnosis not present

## 2017-03-13 DIAGNOSIS — K219 Gastro-esophageal reflux disease without esophagitis: Secondary | ICD-10-CM | POA: Diagnosis not present

## 2017-03-13 DIAGNOSIS — R7989 Other specified abnormal findings of blood chemistry: Secondary | ICD-10-CM | POA: Diagnosis not present

## 2017-03-13 DIAGNOSIS — I10 Essential (primary) hypertension: Secondary | ICD-10-CM | POA: Diagnosis present

## 2017-03-13 DIAGNOSIS — Z87891 Personal history of nicotine dependence: Secondary | ICD-10-CM | POA: Diagnosis not present

## 2017-03-13 DIAGNOSIS — Z820 Family history of epilepsy and other diseases of the nervous system: Secondary | ICD-10-CM

## 2017-03-13 DIAGNOSIS — E8809 Other disorders of plasma-protein metabolism, not elsewhere classified: Secondary | ICD-10-CM | POA: Diagnosis not present

## 2017-03-13 DIAGNOSIS — E1142 Type 2 diabetes mellitus with diabetic polyneuropathy: Secondary | ICD-10-CM | POA: Diagnosis not present

## 2017-03-13 DIAGNOSIS — R2681 Unsteadiness on feet: Secondary | ICD-10-CM | POA: Diagnosis not present

## 2017-03-13 DIAGNOSIS — R4182 Altered mental status, unspecified: Secondary | ICD-10-CM | POA: Diagnosis not present

## 2017-03-13 DIAGNOSIS — R7309 Other abnormal glucose: Secondary | ICD-10-CM | POA: Diagnosis not present

## 2017-03-13 DIAGNOSIS — R52 Pain, unspecified: Secondary | ICD-10-CM | POA: Diagnosis not present

## 2017-03-13 DIAGNOSIS — E119 Type 2 diabetes mellitus without complications: Secondary | ICD-10-CM | POA: Diagnosis not present

## 2017-03-13 DIAGNOSIS — R2689 Other abnormalities of gait and mobility: Secondary | ICD-10-CM | POA: Diagnosis not present

## 2017-03-13 DIAGNOSIS — F0390 Unspecified dementia without behavioral disturbance: Secondary | ICD-10-CM

## 2017-03-13 DIAGNOSIS — M62838 Other muscle spasm: Secondary | ICD-10-CM | POA: Diagnosis not present

## 2017-03-13 DIAGNOSIS — F0280 Dementia in other diseases classified elsewhere without behavioral disturbance: Secondary | ICD-10-CM | POA: Diagnosis not present

## 2017-03-13 DIAGNOSIS — Z48811 Encounter for surgical aftercare following surgery on the nervous system: Secondary | ICD-10-CM | POA: Diagnosis not present

## 2017-03-13 DIAGNOSIS — S065XAA Traumatic subdural hemorrhage with loss of consciousness status unknown, initial encounter: Secondary | ICD-10-CM | POA: Diagnosis present

## 2017-03-13 LAB — GLUCOSE, CAPILLARY
GLUCOSE-CAPILLARY: 165 mg/dL — AB (ref 65–99)
Glucose-Capillary: 177 mg/dL — ABNORMAL HIGH (ref 65–99)
Glucose-Capillary: 145 mg/dL — ABNORMAL HIGH (ref 65–99)

## 2017-03-13 MED ORDER — EZETIMIBE 10 MG PO TABS
10.0000 mg | ORAL_TABLET | Freq: Every evening | ORAL | Status: DC
  Administered 2017-03-14 – 2017-03-27 (×14): 10 mg via ORAL
  Filled 2017-03-13 (×14): qty 1

## 2017-03-13 MED ORDER — INSULIN ASPART 100 UNIT/ML ~~LOC~~ SOLN: 0.0000 [IU] | Freq: Every day | SUBCUTANEOUS | Status: DC

## 2017-03-13 MED ORDER — GLUCERNA SHAKE PO LIQD
237.0000 mL | Freq: Three times a day (TID) | ORAL | Status: DC
  Administered 2017-03-13 – 2017-03-28 (×42): 237 mL via ORAL

## 2017-03-13 MED ORDER — RIVASTIGMINE 9.5 MG/24HR TD PT24
9.5000 mg | MEDICATED_PATCH | Freq: Every day | TRANSDERMAL | Status: DC
  Administered 2017-03-14 – 2017-03-28 (×15): 9.5 mg via TRANSDERMAL
  Filled 2017-03-13 (×16): qty 1

## 2017-03-13 MED ORDER — PREGABALIN 50 MG PO CAPS
50.0000 mg | ORAL_CAPSULE | Freq: Two times a day (BID) | ORAL | Status: DC
  Administered 2017-03-13 – 2017-03-28 (×30): 50 mg via ORAL
  Filled 2017-03-13 (×30): qty 1

## 2017-03-13 MED ORDER — VITAMIN D 1000 UNITS PO TABS
2000.0000 [IU] | ORAL_TABLET | Freq: Every day | ORAL | Status: DC
  Administered 2017-03-14 – 2017-03-28 (×15): 2000 [IU] via ORAL
  Filled 2017-03-13 (×15): qty 2

## 2017-03-13 MED ORDER — ADULT MULTIVITAMIN W/MINERALS CH
1.0000 | ORAL_TABLET | Freq: Every day | ORAL | Status: DC
  Administered 2017-03-14 – 2017-03-28 (×15): 1 via ORAL
  Filled 2017-03-13 (×15): qty 1

## 2017-03-13 MED ORDER — RAMIPRIL 2.5 MG PO CAPS
2.5000 mg | ORAL_CAPSULE | Freq: Every day | ORAL | Status: DC
  Administered 2017-03-14 – 2017-03-28 (×15): 2.5 mg via ORAL
  Filled 2017-03-13 (×17): qty 1

## 2017-03-13 MED ORDER — METFORMIN HCL 500 MG PO TABS
500.0000 mg | ORAL_TABLET | Freq: Two times a day (BID) | ORAL | Status: DC
  Administered 2017-03-14 – 2017-03-15 (×3): 500 mg via ORAL
  Filled 2017-03-13 (×3): qty 1

## 2017-03-13 MED ORDER — ONDANSETRON HCL 4 MG/2ML IJ SOLN: 4.0000 mg | Freq: Four times a day (QID) | INTRAMUSCULAR | Status: DC | PRN

## 2017-03-13 MED ORDER — CALCIUM CARBONATE-VITAMIN D 500-200 MG-UNIT PO TABS
1.0000 | ORAL_TABLET | Freq: Every day | ORAL | Status: DC
  Administered 2017-03-14 – 2017-03-28 (×15): 1 via ORAL
  Filled 2017-03-13 (×15): qty 1

## 2017-03-13 MED ORDER — SENNA 8.6 MG PO TABS
1.0000 | ORAL_TABLET | Freq: Two times a day (BID) | ORAL | Status: DC
  Administered 2017-03-13 – 2017-03-28 (×28): 8.6 mg via ORAL
  Filled 2017-03-13 (×28): qty 1

## 2017-03-13 MED ORDER — METHOCARBAMOL 500 MG PO TABS
500.0000 mg | ORAL_TABLET | Freq: Four times a day (QID) | ORAL | Status: DC | PRN
  Administered 2017-03-13 – 2017-03-18 (×2): 500 mg via ORAL
  Filled 2017-03-13 (×2): qty 1

## 2017-03-13 MED ORDER — SORBITOL 70 % SOLN: 30.0000 mL | Freq: Every day | Status: DC | PRN

## 2017-03-13 MED ORDER — ONDANSETRON HCL 4 MG/2ML IJ SOLN: 4.0000 mg | INTRAMUSCULAR | Status: DC | PRN

## 2017-03-13 MED ORDER — BISACODYL 10 MG RE SUPP: 10.0000 mg | Freq: Every day | RECTAL | Status: DC | PRN

## 2017-03-13 MED ORDER — ACETAMINOPHEN 325 MG PO TABS
650.0000 mg | ORAL_TABLET | ORAL | Status: DC | PRN
  Administered 2017-03-18: 650 mg via ORAL
  Filled 2017-03-13: qty 2

## 2017-03-13 MED ORDER — ATORVASTATIN CALCIUM 40 MG PO TABS
40.0000 mg | ORAL_TABLET | Freq: Every day | ORAL | Status: DC
  Administered 2017-03-14 – 2017-03-27 (×14): 40 mg via ORAL
  Filled 2017-03-13 (×14): qty 1

## 2017-03-13 MED ORDER — ACETAMINOPHEN 650 MG RE SUPP: 650.0000 mg | RECTAL | Status: DC | PRN

## 2017-03-13 MED ORDER — LEVETIRACETAM 500 MG PO TABS
500.0000 mg | ORAL_TABLET | Freq: Two times a day (BID) | ORAL | Status: DC
  Administered 2017-03-13 – 2017-03-19 (×12): 500 mg via ORAL
  Filled 2017-03-13 (×12): qty 1

## 2017-03-13 MED ORDER — PANTOPRAZOLE SODIUM 40 MG PO TBEC
40.0000 mg | DELAYED_RELEASE_TABLET | Freq: Every day | ORAL | Status: DC
  Administered 2017-03-13 – 2017-03-27 (×15): 40 mg via ORAL
  Filled 2017-03-13 (×15): qty 1

## 2017-03-13 MED ORDER — ONDANSETRON HCL 4 MG PO TABS: 4.0000 mg | ORAL_TABLET | Freq: Four times a day (QID) | ORAL | Status: DC | PRN

## 2017-03-13 MED ORDER — ONDANSETRON HCL 4 MG PO TABS: 4.0000 mg | ORAL_TABLET | ORAL | Status: DC | PRN

## 2017-03-13 MED ORDER — HYDROCODONE-ACETAMINOPHEN 5-325 MG PO TABS
1.0000 | ORAL_TABLET | ORAL | Status: DC | PRN
  Administered 2017-03-13 – 2017-03-27 (×4): 1 via ORAL
  Filled 2017-03-13 (×4): qty 1

## 2017-03-13 NOTE — Care Management Note (Addendum)
Case Management Note  Patient Details  Name: Daniel [name] MRN: [id number] Date of Birth: 1935/02/14  Subjective/Objective:   S/p bilateral evacuation of SDH                Action/Plan: Chart reviewed. And plan is IP rehab when medically stable. CIR RN coordinator has begun Biochemist, clinical.  Expected Discharge Date:                Expected Discharge Plan:  [place]  In-House Referral:  NA  Discharge planning Services  CM Consult  Post Acute Care Choice:  NA Choice offered to:  NA  DME Arranged:  N/A DME Agency:  NA  HH Arranged:  NA HH Agency:  NA  Status of Service:  Completed, signed off  If discussed at [place] of Stay Meetings, dates discussed:    Additional Comments:  Erenest Rasher, RN 03/13/2017, 2:47 PM

## 2017-03-13 NOTE — Progress Notes (Signed)
Patient ID: Daniel [name], male   DOB: 02-28-1935, 82 y.o.   MRN: [id number] Vital signs are stable Patientis alert oriented 2 Incisions are clean and dry Hopeful for placement in rehabilitation for CIR

## 2017-03-13 NOTE — Care Management Important Message (Signed)
Important Message  Patient Details  Name: Daniel [name] MRN: [id number] Date of Birth: [birth year]   Medicare Important Message Given:  Yes    Stan Cantave 03/13/2017, 1:25 PM

## 2017-03-13 NOTE — H&P (Signed)
Physical Medicine and Rehabilitation Admission H&P    Chief Complaint  Patient presents with  . Encephalopathy  : HPI: Daniel [name] is a 82 y.o. right handed male with history of Alzheimer's disease maintained on Exelon patch, diabetes mellitus, hypertension as well as history of subdural hygroma that has been monitored since November 2018.  History taken from chart review and patient.  Patient lives with spouse and son. Multiple level home. 2 steps to entry. Reported to be independent prior to admission. Presented 03/06/2017 for shuffling gait instability and generalized decline over 10-14 days as well as cognitive decline. Follow-up cranial CT scan reviewed, showing bilateral SDH.  Per report, bilateral acute on chronic subdural hematomas with more prominent acute component in the left subdural hematoma compared to prior exam. There was a 4 mm left to right midline shift. Underwent bilateral burr hole evacuation of chronic subdural hematoma 03/09/2017 per Dr. Kathyrn Sheriff. 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  Keppra for seizure prophylaxis. Tolerating a regular diet. Physical therapy evaluation completed with recommendations of physical medicine rehabilitation consult. Patient is admitted for a comprehensive rehabilitation program.  Review of Systems  Constitutional: Negative for chills and fever.  HENT: Negative for hearing loss.   Eyes: Negative for blurred vision and double vision.  Respiratory: Negative for shortness of breath.   Cardiovascular: Negative for chest pain and leg swelling.  Gastrointestinal: Positive for constipation.       GERD  Genitourinary: Positive for urgency.  Musculoskeletal: Positive for myalgias.  Skin: Negative for rash.  Neurological:       Gait instability  Psychiatric/Behavioral:  Positive for memory loss.  All other systems reviewed and are negative.  Past Medical History:  Diagnosis Date  . Alzheimer disease   . Arthritis   . Cancer (HCC)    Skin  . Diabetes mellitus without complication ([place])   . GERD (gastroesophageal reflux disease)   . Hyperlipidemia   . Hypertension    Past Surgical History:  Procedure Laterality Date  . BACK SURGERY    . BURR HOLE Bilateral 03/09/2017   Procedure: BURR HOLES FOR SUBDURAL HEMATOMA BILATERAL;  Surgeon: Consuella Lose, MD;  Location: [facility];  Service: Neurosurgery;  Laterality: Bilateral;  . CATARACT EXTRACTION Bilateral 1987 89  . ROTATOR CUFF REPAIR Left 2003   Family History  Problem Relation Age of Onset  . Alzheimer's disease Mother   . Alzheimer's disease Maternal Grandfather    Social History:  reports that he quit smoking about 39 years ago. he has never used smokeless tobacco. He reports that he drinks alcohol. He reports that he does not use drugs. Allergies:  Allergies  Allergen Reactions  . Donepezil Diarrhea  . Tape Rash    PAPER TAPE IS PREFERRED, PLEASE   Medications Prior to Admission  Medication Sig Dispense Refill  . Alpha Lipoic Acid 200 MG CAPS Take 200 mg by mouth daily.     Marland Kitchen atorvastatin (LIPITOR) 40 MG tablet Take 40 mg by mouth daily at 6 (six) AM.     . celecoxib (CELEBREX) 200 MG capsule Take 200 mg by mouth daily.     . Cholecalciferol ([employer] D3) 2000 units CAPS Take 2,000 Units by mouth daily.     Marland Kitchen ezetimibe (ZETIA) 10 MG tablet Take 10 mg by mouth every evening.   3  . LYRICA 50 MG capsule  Take 50 mg by mouth 2 (two) times daily.   3  . metFORMIN (GLUCOPHAGE) 500 MG tablet Take 500 mg by mouth 2 (two) times daily.  2  . Multiple Minerals-Vitamins (CALCIUM-MAGNESIUM-ZINC-D3) TABS Take 1 tablet by mouth daily.     . Multiple Vitamin (MULTIVITAMIN WITH MINERALS) TABS Take 1 tablet by mouth daily.    . naproxen sodium (ALEVE) 220 MG tablet Take 440 mg by mouth 2 (two) times daily as  needed (for lower back pain).    . pantoprazole (PROTONIX) 40 MG tablet Take 40 mg by mouth daily before breakfast.     . ramipril (ALTACE) 2.5 MG capsule Take 2.5 mg by mouth daily.     . rivastigmine (EXELON) 9.5 mg/24hr Place 1 patch (9.5 mg total) onto the skin daily. (Patient taking differently: Place 9.5 mg onto the skin daily. 0700) 30 patch 11  . sodium chloride (OCEAN) 0.65 % SOLN nasal spray Place 1 spray into both nostrils every morning.      Drug Regimen Review Drug regimen was reviewed and remains appropriate with no significant issues identified  Home: Home Living Family/patient expects to be discharged to:: Private residence Living Arrangements: Spouse/significant other, Children Available Help at Discharge: Family Type of Home: House Home Access: Stairs to enter Technical brewer of Steps: 2 Entrance Stairs-Rails: Right Home Layout: Multi-level Alternate Level Stairs-Number of Steps: 8 Alternate Level Stairs-Rails: Left Bathroom Shower/Tub: Tub/shower unit, Multimedia programmer: Handicapped height Home Equipment: Shower seat - built in, Museum/gallery curator bars - tub/shower   Functional History: Prior Function Level of Independence: Independent  Functional Status:  Mobility: Bed Mobility Overal bed mobility: Needs Assistance Bed Mobility: Rolling, Sidelying to Sit Rolling: Min assist Sidelying to sit: Mod assist General bed mobility comments: min assist to roll to sidelying, moderate assist to elevate trunk to upright position at EOB, patient limited by dizziness with transitional movement Transfers Overall transfer level: Needs assistance Equipment used: 1 person hand held assist Transfers: Sit to/from Stand, Stand Pivot Transfers Sit to Stand: Mod assist Stand pivot transfers: Mod assist General transfer comment: moderate assist to power to upright and provide stability. Initially positioned with wrap around assist but transitioned to face to face prior  to pivotal steps to chair. Patient able to take festinating steps to chair with increased physical assist as well as increased time and effort Ambulation/Gait General Gait Details: shuffling steps to chair during pivot    ADL:    Cognition: Cognition Overall Cognitive Status: History of cognitive impairments - at baseline Orientation Level: Oriented X4 Cognition Arousal/Alertness: Awake/alert Behavior During Therapy: Flat affect(tangential) Overall Cognitive Status: History of cognitive impairments - at baseline Area of Impairment: Memory, Problem solving, Attention Current Attention Level: Selective Memory: Decreased short-term memory Problem Solving: Slow processing, Requires verbal cues, Requires tactile cues, Difficulty sequencing  Physical Exam: Blood pressure (!) 151/71, pulse 64, temperature 97.6 F (36.4 C), temperature source Axillary, resp. rate 19, height 6\' 1"  (1.854 m), weight 70.3 kg (155 lb), SpO2 97 %. Physical Exam  Vitals reviewed. Constitutional: He appears well-developed and well-nourished.  HENT:  Bilateral burr hole sites are dressed  Eyes: EOM are normal. Right eye exhibits no discharge. Left eye exhibits no discharge.  Neck: Normal range of motion. Neck supple.  Cardiovascular: Normal rate and regular rhythm.  Respiratory: Effort normal and breath sounds normal.  GI: Soft. Bowel sounds are normal.  Musculoskeletal:  No edema or tenderness in extremities  Neurological: He is alert.  Makes good eye contact  with examiner.  Follows simple commands.  Motor: B/l UE: 4/5  B/l LE: HF 3/5, KE 4-/5, ADF/PF 4+/5   Skin:  See above  Psychiatric: He has a normal mood and affect. His behavior is normal.   Results for orders placed or performed during the hospital encounter of 03/06/17 (from the past 48 hour(s))  Glucose, capillary     Status: Abnormal   Collection Time: 03/10/17 12:40 PM  Result Value Ref Range   Glucose-Capillary 174 (H) 65 - 99 mg/dL    Glucose, capillary     Status: Abnormal   Collection Time: 03/10/17  4:45 PM  Result Value Ref Range   Glucose-Capillary 146 (H) 65 - 99 mg/dL  Glucose, capillary     Status: Abnormal   Collection Time: 03/10/17  9:55 PM  Result Value Ref Range   Glucose-Capillary 223 (H) 65 - 99 mg/dL  Glucose, capillary     Status: Abnormal   Collection Time: 03/11/17  7:49 AM  Result Value Ref Range   Glucose-Capillary 131 (H) 65 - 99 mg/dL   Comment 1 Notify RN    Comment 2 Document in Chart   Glucose, capillary     Status: Abnormal   Collection Time: 03/11/17 12:20 PM  Result Value Ref Range   Glucose-Capillary 144 (H) 65 - 99 mg/dL   Comment 1 Notify RN    Comment 2 Document in Chart   Glucose, capillary     Status: Abnormal   Collection Time: 03/12/17  7:48 AM  Result Value Ref Range   Glucose-Capillary 155 (H) 65 - 99 mg/dL  Glucose, capillary     Status: Abnormal   Collection Time: 03/12/17 11:56 AM  Result Value Ref Range   Glucose-Capillary 156 (H) 65 - 99 mg/dL   No results found.  Medical Problem List and Plan: 1.  Decreased functional mobility secondary to bilateral subdural hematomas with midline shift. Status post burr hole evacuation 03/09/2017 2.  DVT Prophylaxis/Anticoagulation: SCDs. Patient is ambulatory 3. Pain Management: Lyrica 50 mg twice a day, Hydrocodone and Robaxin as needed 4. Mood/Alzheimer's. Exelon patch 9.5 mg daily 5. Neuropsych: This patient is capable of making decisions on his own behalf. 6. Skin/Wound Care: Routine skin checks 7. Fluids/Electrolytes/Nutrition: Routine I&O's with follow-up chemistries 8. Seizure prophylaxis. Keppra 500 mg twice a day 9. Diabetes mellitus peripheral neuropathy. Hemoglobin A1c 6.9. Glucophage 500 mg twice a day. Check blood sugars before meals and at bedtime. Diabetic teaching 10. Hypertension. Altace 2.5 mg daily 11. Hyperlipidemia. Lipitor/Zetia 12. Constipation. Laxative assistance  Post Admission Physician  Evaluation: 1. Preadmission assessment reviewed and changes made below. 2. Functional deficits secondary  to bilateral subdural hematomas. 3. Patient is admitted to receive collaborative, interdisciplinary care between the physiatrist, rehab nursing staff, and therapy team. 4. Patient's level of medical complexity and substantial therapy needs in context of that medical necessity cannot be provided at a lesser intensity of care such as a SNF. 5. Patient has experienced substantial functional loss from his/her baseline which was documented above under the "Functional History" and "Functional Status" headings.  Judging by the patient's diagnosis, physical exam, and functional history, the patient has potential for functional progress which will result in measurable gains while on inpatient rehab.  These gains will be of substantial and practical use upon discharge  in facilitating mobility and self-care at the household level. 6. Physiatrist will provide 24 hour management of medical needs as well as oversight of the therapy plan/treatment and provide guidance as appropriate  regarding the interaction of the two. 7. 24 hour rehab nursing will assist with bladder management, safety, skin/wound care, disease management, pain management and patient education  and help integrate therapy concepts, techniques,education, etc. 8. PT will assess and treat for/with: Lower extremity strength, range of motion, stamina, balance, functional mobility, safety, adaptive techniques and equipment, woundcare, coping skills, pain control, education.   Goals are: Supervision/Mod I. 9. OT will assess and treat for/with: ADL's, functional mobility, safety, upper extremity strength, adaptive techniques and equipment, wound mgt, ego support, and community reintegration.   Goals are: Supervision/Mod I. Therapy may not proceed with showering this patient. 10. SLP will assess and treat for/with: cognition.  Goals are: Supervision/Mod  I. 11. Case Management and Social Worker will assess and treat for psychological issues and discharge planning. 12. Team conference will be held weekly to assess progress toward goals and to determine barriers to discharge. 13. Patient will receive at least 3 hours of therapy per day at least 5 days per week. 14. ELOS: 10-14 days.       15. Prognosis:  good  Delice Lesch, MD, ABPMR Lavon Paganini Angiulli, PA-C 03/12/2017

## 2017-03-13 NOTE — Progress Notes (Signed)
I have insurance approval to admit pt to CIR today. I contacted Dr. Annette Stable by phone and he will make the arraignments to d/c today. I met with pt and his wife at bedside and they are in agreement. RN CM made aware. I will make the arrangements to admit today. [phone]

## 2017-03-13 NOTE — PMR Pre-admission (Signed)
PMR Admission Coordinator Pre-Admission Assessment  Patient: Daniel [name] is an 82 y.o., male MRN: [id number] DOB: 02-13-1935 Height: 6\' 1"  (185.4 cm) Weight: 70.3 kg (155 lb)              Insurance Information HMO:     PPO: yes     PCP:      IPA:      80/20:      OTHER: medicare advantage plan PRIMARY: [employer]      Policy#: [id number]      Subscriber: pt CM Name: Manuela Schwartz      Phone#: [phone]     Fax#: Epic access Pre-Cert#: [id number] approved for 10 days      Employer: retired Benefits:  Phone #: [phone]     Name: 03/12/2017 Eff. Date: 01/16/2017     Deduct: none      Out of Pocket Max: $3100      Life Max: none CIR: $250 co pay day 1; $125 co pay per day days 2 until 6      SNF: no copy days 1 to 10; $160 co pay per day days 21-100 Outpatient: $10 co pay per visit     Co-Pay: visits per medical neccesity Home Health: 100%      Co-Pay: visits per medical neccesity DME: 80%     Co-Pay: 20% Providers: in network  SECONDARY: none      Medicaid Application Date:       Case Manager:  Disability Application Date:       Case Worker:   Emergency Facilities manager Information    Name Relation Home Work [place] Spouse [phone]  [phone]     Current Medical History  Patient Admitting Diagnosis: bilateral SDH  History of Present Illness:  HPI: Daniel [name] a 82 y.o.right handed malewith history of Alzheimer's disease maintained on Exelon patch, diabetes mellitus, hypertensionas well as history of subdural hygroma that has been monitored since November 2018. Presented2/19/2019for shuffling gait instability and generalized decline over 10-14 days as well as cognitive decline. Follow-up cranial CT scan reviewed, showing bilateral SDH. Per report, bilateral acute on chronic subdural hematomas with more prominent acute component in the left subdural hematoma compared to prior exam. There was a 4 mm left to right midline shift.  Underwent bilateral burr hole evacuation of chronic subdural hematoma 03/09/2017 per Dr. Kathyrn Sheriff. 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 Keppra for seizure prophylaxis. Tolerating a regular diet.   Past Medical History  Past Medical History:  Diagnosis Date  . Alzheimer disease   . Arthritis   . Cancer (HCC)    Skin  . Diabetes mellitus without complication ([place])   . GERD (gastroesophageal reflux disease)   . Hyperlipidemia   . Hypertension     Family History  family history includes Alzheimer's disease in his maternal grandfather and mother.  Prior Rehab/Hospitalizations:  Has the patient had major surgery during 100 days prior to admission? No  Back surgery 08/2016 insurance denied CIR admit so patient went to [facility] for rehab  Current Medications   Current Facility-Administered Medications:  .  0.9 %  sodium chloride infusion, , Intravenous, Continuous, Costella, Vista Mink, PA-C, Stopped at 03/11/17 1400 .  acetaminophen (TYLENOL) tablet 650 mg, 650 mg, Oral, Q4H PRN, 650 mg at 03/13/17 0338 **OR** acetaminophen (TYLENOL) suppository 650 mg, 650  mg, Rectal, Q4H PRN, Costella, Vincent J, PA-C .  atorvastatin (LIPITOR) tablet 40 mg, 40 mg, Oral, q1800, Consuella Lose, MD, 40 mg at 03/12/17 1814 .  bisacodyl (DULCOLAX) suppository 10 mg, 10 mg, Rectal, Daily PRN, Costella, Vincent J, PA-C .  calcium-vitamin D (OSCAL WITH D) 500-200 MG-UNIT per tablet 1 tablet, 1 tablet, Oral, Q breakfast, Consuella Lose, MD, 1 tablet at 03/13/17 0919 .  cholecalciferol (VITAMIN D) tablet 2,000 Units, 2,000 Units, Oral, Daily, Costella, Vincent Lenna Sciara, PA-C, 2,000 Units at 03/13/17 0920 .  ezetimibe (ZETIA) tablet 10 mg, 10 mg, Oral, QPM, Costella, Vincent J, PA-C, 10 mg at 03/12/17 1814 .  feeding supplement (GLUCERNA  SHAKE) (GLUCERNA SHAKE) liquid 237 mL, 237 mL, Oral, TID BM, Consuella Lose, MD, 237 mL at 03/12/17 2045 .  hydrALAZINE (APRESOLINE) injection 20 mg, 20 mg, Intravenous, Q8H PRN, Ditty, Kevan Ny, MD, 20 mg at 03/13/17 0338 .  HYDROcodone-acetaminophen (NORCO/VICODIN) 5-325 MG per tablet 1 tablet, 1 tablet, Oral, Q4H PRN, Costella, Vista Mink, PA-C, 1 tablet at 03/11/17 2120 .  insulin aspart (novoLOG) injection 0-5 Units, 0-5 Units, Subcutaneous, QHS, Costella, Vincent Lenna Sciara, PA-C, 2 Units at 03/10/17 2243 .  insulin aspart (novoLOG) injection 0-9 Units, 0-9 Units, Subcutaneous, TID WC, Costella, Vincent J, PA-C, 2 Units at 03/13/17 1315 .  labetalol (NORMODYNE,TRANDATE) injection 10-40 mg, 10-40 mg, Intravenous, Q10 min PRN, Ditty, Kevan Ny, MD, 20 mg at 03/08/17 6301 .  labetalol (NORMODYNE,TRANDATE) injection 10-40 mg, 10-40 mg, Intravenous, Q10 min PRN, Costella, Vincent J, PA-C, 20 mg at 03/09/17 1601 .  levETIRAcetam (KEPPRA) tablet 500 mg, 500 mg, Oral, BID, Newman Pies, MD, 500 mg at 03/13/17 0920 .  magnesium citrate solution 1 Bottle, 1 Bottle, Oral, Once PRN, Costella, Vista Mink, PA-C .  metFORMIN (GLUCOPHAGE) tablet 500 mg, 500 mg, Oral, BID WC, Costella, Vincent J, PA-C, 500 mg at 03/13/17 0920 .  methocarbamol (ROBAXIN) tablet 500 mg, 500 mg, Oral, Q6H PRN, Costella, Vista Mink, PA-C .  multivitamin with minerals tablet 1 tablet, 1 tablet, Oral, Daily, Costella, Vista Mink, PA-C, 1 tablet at 03/13/17 0919 .  naloxone (NARCAN) injection 0.08 mg, 0.08 mg, Intravenous, PRN, Costella, Vincent J, PA-C .  ondansetron (ZOFRAN) tablet 4 mg, 4 mg, Oral, Q4H PRN **OR** ondansetron (ZOFRAN) injection 4 mg, 4 mg, Intravenous, Q4H PRN, Costella, Vincent J, PA-C .  pantoprazole (PROTONIX) EC tablet 40 mg, 40 mg, Oral, QHS, Consuella Lose, MD, 40 mg at 03/12/17 2045 .  pregabalin (LYRICA) capsule 50 mg, 50 mg, Oral, BID, Costella, Vincent J, PA-C, 50 mg at 03/13/17 0920 .   promethazine (PHENERGAN) tablet 12.5 mg, 12.5 mg, Oral, Q6H PRN, Costella, Vincent J, PA-C .  promethazine (PHENERGAN) tablet 12.5-25 mg, 12.5-25 mg, Oral, Q4H PRN, Costella, Vincent J, PA-C .  ramipril (ALTACE) capsule 2.5 mg, 2.5 mg, Oral, Daily, Costella, Vincent J, PA-C, 2.5 mg at 03/13/17 0954 .  rivastigmine (EXELON) 9.5 mg/24hr 9.5 mg, 9.5 mg, Transdermal, Daily, Costella, Vincent J, PA-C, 9.5 mg at 03/13/17 0947 .  senna (SENOKOT) tablet 8.6 mg, 1 tablet, Oral, BID, Costella, Vincent J, PA-C, 8.6 mg at 03/13/17 0920 .  senna-docusate (Senokot-S) tablet 1 tablet, 1 tablet, Oral, QHS PRN, Costella, Vista Mink, PA-C .  zolpidem (AMBIEN) tablet 5 mg, 5 mg, Oral, QHS PRN, Costella, Vista Mink, PA-C, 5 mg at 03/08/17 2104  Patients Current Diet: Seizure precautions Diet regular Room service appropriate? Yes; Fluid consistency: Thin  Precautions / Restrictions Precautions Precautions: Fall Restrictions Weight Bearing Restrictions: No  Has the patient had 2 or more falls or a fall with injury in the past year?No  Prior Activity Level Community (5-7x/wk): has not driven since back surgery 08/2016  Home Assistive Devices / [place] Devices/Equipment: Kasandra Knudsen (specify quad or straight) Home Equipment: Shower seat - built in, [employer] - tub/shower  Prior Device Use: Indicate devices/aids used by the patient prior to current illness, exacerbation or injury? None of the above  Prior Functional Level Prior Function Level of Independence: (prior to 2 weeks pta, pt not usisng cane or RW; decline in f) Comments: pt reports he occasionally used a [facility]   Self Care: Did the patient need help bathing, dressing, using the toilet or eating?  Independent  Indoor Mobility: Did the patient need assistance with walking from room to room (with or without device)? Independent  Stairs: Did the patient need assistance with internal or external stairs (with or without device)?  Independent  Functional Cognition: Did the patient need help planning regular tasks such as shopping or remembering to take medications? Needed some help   Was Independent without AD until 2 weeks pta then sudden decline physically and was no longer talking which was unlike him. Patient typically very social.  Current Functional Level Cognition  Overall Cognitive Status: History of cognitive impairments - at baseline Current Attention Level: Selective Orientation Level: Oriented X4 General Comments: Pt anxious and self distracts over need for bowel movement.   I am familiar with this pt from a previous admission and he is close to his cognitive baseline.  I will need to speak with his wife to figure out what his most reccent physical baseline was.     Extremity Assessment (includes Sensation/Coordination)  Upper Extremity Assessment: Generalized weakness  Lower Extremity Assessment: Defer to PT evaluation RLE Coordination: decreased fine motor, decreased gross motor LLE Coordination: decreased fine motor, decreased gross motor    ADLs  Overall ADL's : Needs assistance/impaired Eating/Feeding: Independent Grooming: Wash/dry hands, Wash/dry face, Oral care, Brushing hair, Set up, Sitting Upper Body Bathing: Minimal assistance, Sitting Lower Body Bathing: Moderate assistance, Sit to/from stand Upper Body Dressing : Moderate assistance, Sitting Lower Body Dressing: Total assistance, Sit to/from stand Toilet Transfer: Minimal assistance, +2 for physical assistance, Stand-pivot, BSC Toilet Transfer Details (indicate cue type and reason): assist to steady  Toileting- Clothing Manipulation and Hygiene: Total assistance, Sit to/from stand Toileting - Clothing Manipulation Details (indicate cue type and reason): Pt fatigues quickly with standing, and unable to unweight UEs for peri care and clothing manipulation  Functional mobility during ADLs: Minimal assistance, +2 for physical  assistance General ADL Comments: Pt fixated on need to have BM.       Mobility  Overal bed mobility: Needs Assistance Bed Mobility: Supine to Sit Rolling: Min assist Sidelying to sit: Mod assist General bed mobility comments: assist to lift trunk , pt was able to progress bil legs to EOB unassisted.     Transfers  Overall transfer level: Needs assistance Equipment used: 2 person hand held assist Transfers: Sit to/from Stand, Stand Pivot Transfers Sit to Stand: Min assist, +2 safety/equipment Stand pivot transfers: Min assist, +2 safety/equipment General transfer comment: assist to steady.  Pt stood x 5 times for peri care.  He tolerated standing from 15-45" before becoming anxious and returning to sit.  He initially reported he felt off balance (despite holding onto therapist with both hands, and then admitted that he started to get shaky and had to sit down).  Ambulation / Gait / Stairs / Wheelchair Mobility  Ambulation/Gait Ambulation/Gait assistance: +2 physical assistance, Min assist Ambulation Distance (Feet): 8 Feet Assistive device: 2 person hand held assist Gait Pattern/deviations: Step-to pattern, Trunk flexed General Gait Details: Pt with short, choppy steps, especially noticeable when backing up to the chair.  Heavy min hand held assist needed for balance in standing.  I believe he would be safer to progress gait with RW initally before returning to a cane or no AD (per pt he only occasionally used the cane before this decline).     Posture / Balance Dynamic Sitting Balance Sitting balance - Comments: maintains static sitting with min guard assist  Balance Overall balance assessment: Needs assistance Sitting-balance support: Feet supported Sitting balance-Leahy Scale: Fair Sitting balance - Comments: maintains static sitting with min guard assist  Standing balance support: Bilateral upper extremity supported, During functional activity Standing balance-Leahy Scale:  Poor Standing balance comment: requires assist for balance     Special needs/care consideration BiPAP/CPAP  N/a CPM  N/a Continuous Drip IV  N/a Dialysis  N/a Life Vest  N/a Oxygen  N/a Special Bed  N/a Trach Size  N/a Wound Vac  N/a Skin surgical incisions with dressing and staples Bowel mgmt: continent LBM 03/12/2017 Bladder mgmt: incontinent; external catheter Diabetic mgmt n/a   Previous Home Environment Living Arrangements: (son Scote lives with parents; disabled due to Bipolar)  Lives With: Spouse, Son Available Help at Discharge: Family, Available 24 hours/day Type of Home: House Home Layout: Multi-level Alternate Level Stairs-Rails: Left Alternate Level Stairs-Number of Steps: 8 Home Access: Stairs to enter Entrance Stairs-Rails: Right Entrance Stairs-Number of Steps: 2 Bathroom Shower/Tub: Tub/shower unit, Multimedia programmer: Handicapped height Bathroom Accessibility: Yes How Accessible: Accessible via walker Home Care Services: No Additional Comments: wife confirmed PLOF  Discharge Living Setting Plans for Discharge Living Setting: Patient's home, Lives with (comment)(wife and adult disabled son) Type of Home at Discharge: House Discharge Home Layout: Multi-level Alternate Level Stairs-Rails: Left Alternate Level Stairs-Number of Steps: 8 Discharge Home Access: Stairs to enter Entrance Stairs-Rails: Right Entrance Stairs-Number of Steps: 2 Discharge Bathroom Shower/Tub: Tub/shower unit, Walk-in shower Discharge Bathroom Toilet: Handicapped height Discharge Bathroom Accessibility: Yes How Accessible: Accessible via walker Does the patient have any problems obtaining your medications?: No  Social/Family/Support Systems Patient Roles: Spouse, Parent(retired Materials engineer) Contact Information: [name], wife Anticipated Caregiver: wife and son, Event organiser Anticipated Ambulance person Information: see above Ability/Limitations of Caregiver: no  limitations Caregiver Availability: 24/7 Discharge Plan Discussed with Primary Caregiver: Yes Is Caregiver In Agreement with Plan?: Yes Does Caregiver/Family have Issues with Lodging/Transportation while Pt is in Rehab?: No  Goals/Additional Needs Patient/Family Goal for Rehab: supervision with PT, OT, and SLP Expected length of stay: ELOS 10 to 14 days Pt/Family Agrees to Admission and willing to participate: Yes Program Orientation Provided & Reviewed with Pt/Caregiver Including Roles  & Responsibilities: Yes  Decrease burden of Care through IP rehab admission: n/a  Possible need for SNF placement upon discharge: not anticipated  Patient Condition: This patient's medical and functional status has changed since the consult dated 03/12/2017 in which the Rehabilitation Physician determined and documented that the patient was potentially appropriate for intensive rehabilitative care in an inpatient rehabilitation facility. Issues have been addressed and update has been discussed with Dr. Posey Pronto and patient now appropriate for inpatient rehabilitation. Will admit to inpatient rehab today.   Preadmission Screen Completed By:  Cleatrice Burke, 03/13/2017 4:24 PM ______________________________________________________________________   Discussed status  with Dr. Posey Pronto on 03/13/2017 at  1634 and received telephone approval for admission today.  Admission Coordinator:  Cleatrice Burke, time 2197 Date 03/13/2017

## 2017-03-13 NOTE — IPOC Note (Signed)
Overall Plan of Care [facility]) Patient Details Name: Daniel [name] MRN: [id number] DOB: 01-23-1935  Admitting Diagnosis: SDH with encephalopathy  Hospital Problems: Active Problems:   Encephalopathy   Subdural hematoma (HCC)   Type 2 diabetes mellitus with peripheral neuropathy ([place])     Functional Problem List: Nursing Endurance, Medication Management, Safety, Pain, Nutrition, Bladder, Motor, Skin Integrity, Behavior  PT Safety, Endurance, Motor, Balance  OT Balance, Endurance, Motor, Safety  SLP    TR         Basic ADL's: OT Grooming, Bathing, Toileting, Dressing     Advanced  ADL's: OT       Transfers: PT Bed Mobility, Bed to Chair, Furniture, Floor, Teacher, early years/pre, Metallurgist: PT Ambulation, Data processing manager, Emergency planning/management officer     Additional Impairments: OT None  SLP        TR      Anticipated Outcomes Item Anticipated Outcome  Self Feeding    Swallowing      Basic self-care  Media planner Transfers Supervision  Bowel/Bladder  contient of bowel and bladder  Transfers  supervision  Locomotion  supervision with LRAD for ambulation  Communication     Cognition     Pain  pain < or = 4  Safety/Judgment  no fall at discharge   Therapy Plan: PT Intensity: Minimum of 1-2 x/day ,45 to 90 minutes PT Frequency: 5 out of 7 days PT Duration Estimated Length of Stay: 18-21 OT Intensity: Minimum of 1-2 x/day, 45 to 90 minutes OT Frequency: 5 out of 7 days OT Duration/Estimated Length of Stay: 18-21 days SLP Duration/Estimated Length of Stay: N/A    Team Interventions: Nursing Interventions Pain Management, Skin Care/Wound Management, Discharge Planning, Cognitive Remediation/Compensation, Medication Management, Disease Management/Prevention, Bladder Management, Patient/Family Education  PT interventions Ambulation/gait training, Training and development officer, Community reintegration, Discharge planning,  DME/adaptive equipment instruction, Functional mobility training, Neuromuscular re-education, Pain management, Patient/family education, Psychosocial support, Splinting/orthotics, Stair training, Therapeutic Activities, Therapeutic Exercise, UE/LE Strength taining/ROM, UE/LE Coordination activities, Visual/perceptual remediation/compensation  OT Interventions Training and development officer, Cognitive remediation/compensation, Community reintegration, Discharge planning, Disease mangement/prevention, DME/adaptive equipment instruction, Functional mobility training, Neuromuscular re-education, Patient/family education, Self Care/advanced ADL retraining, Therapeutic Activities, Therapeutic Exercise, UE/LE Strength taining/ROM, Visual/perceptual remediation/compensation, UE/LE Coordination activities  SLP Interventions    TR Interventions    SW/CM Interventions Discharge Planning, Psychosocial Support, Patient/Family Education   Barriers to Discharge MD  Medical stability and Wound care  Nursing Behavior    PT      OT      SLP      SW       Team Discharge Planning: Destination: PT-Home ,OT- Home , SLP-Home Projected Follow-up: PT-Home health PT, 24 hour supervision/assistance, OT-  Home health OT, SLP-None Projected Equipment Needs: PT-None recommended by OT, OT- To be determined, SLP-None recommended by SLP Equipment Details: PT- , OT-  Patient/family involved in discharge planning: PT- Patient,  OT-Patient, SLP-Patient(consulted with wife via phone to establish baseline deficits)  MD ELOS: 14-17 days. Medical Rehab Prognosis:  Good Assessment: 82 y.o.right handed malewith history of Alzheimer's disease maintained on Exelon patch, diabetes mellitus, hypertensionas well as history of subdural hygroma that has been monitored since November 2018.Presented2/19/2019for shuffling gait instability and generalized decline over 10-14 days as well as cognitive decline. Follow-up cranial CT scan  reviewed, showing bilateral SDH. Per report, bilateral acute on chronic subdural hematomas with more prominent acute component in the left subdural hematoma compared to  prior exam. There was a 4 mm left to right midline shift. Underwent bilateral burr hole evacuation of chronic subdural hematoma 03/09/2017 per Dr. Kathyrn Sheriff. 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 Keppra for seizure prophylaxis. Patient with resulting functional deficits with mobility, self-care, cognition.  Will set goals for Supervision with PT/OT.  See Team Conference Notes for weekly updates to the plan of care

## 2017-03-13 NOTE — Progress Notes (Signed)
Physical Therapy Treatment Patient Details Name: Daniel [name] MRN: [id number] DOB: May 20, 1935 Today's Date: 03/13/2017    History of Present Illness This 82 y.o. male with chronic bil. subdural hygromas admitted with shuffling gait and 10-14 day cognitive decline.  CT of head showed bil. acute on chronic SDH with more prominant acute component in the left SDH compared to previous exam.   He underwent bil. Burr hole evacuation.  PMH includes:  Alzheimer's, DM, CA, s/p lumbar surgery.     PT Comments    Pt is requiring less assistance overall, and still, two people for safety and to help decrease his anxiety when he is up on his feet.  Per his wife, who came after therapists were done, he was walking without an AD until the last few weeks he had to switch back to a RW.  He remains appropriate for CIR level therapies at discharge.     Follow Up Recommendations  CIR;Supervision/Assistance - 24 hour     Equipment Recommendations  None recommended by PT    Recommendations for Other Services   NA     Precautions / Restrictions Precautions Precautions: Fall    Mobility  Bed Mobility Overal bed mobility: Needs Assistance Bed Mobility: Supine to Sit Rolling: Min assist         General bed mobility comments: assist to lift trunk , pt was able to progress bil legs to EOB unassisted.   Transfers Overall transfer level: Needs assistance Equipment used: 2 person hand held assist Transfers: Sit to/from [facility] Sit to Stand: Min assist;+2 safety/equipment Stand pivot transfers: Min assist;+2 safety/equipment       General transfer comment: assist to steady.  Pt stood x 5 times for peri care.  He tolerated standing from 15-45" before becoming anxious and returning to sit.  He initially reported he felt off balance (despite holding onto therapist with both hands, and then admitted that he started to get shaky and had to sit down).     Ambulation/Gait Ambulation/Gait assistance: +2 physical assistance;Min assist Ambulation Distance (Feet): 8 Feet Assistive device: 2 person hand held assist Gait Pattern/deviations: Step-to pattern;Trunk flexed     General Gait Details: Pt with short, choppy steps, especially noticeable when backing up to the chair.  Heavy min hand held assist needed for balance in standing.  I believe he would be safer to progress gait with RW initally before returning to a cane or no AD (per pt he only occasionally used the cane before this decline).           Balance Overall balance assessment: Needs assistance Sitting-balance support: Feet supported Sitting balance-Leahy Scale: Fair Sitting balance - Comments: maintains static sitting with min guard assist    Standing balance support: Bilateral upper extremity supported;During functional activity Standing balance-Leahy Scale: Poor Standing balance comment: requires assist for balance                             Cognition Arousal/Alertness: Awake/alert Behavior During Therapy: Anxious Overall Cognitive Status: History of cognitive impairments - at baseline                                 General Comments: Pt anxious and self distracts over need for bowel movement.   I am familiar with this pt from a previous admission and he is close to his cognitive baseline.  I will need to speak with his wife to figure out what his most reccent physical baseline was.              Pertinent Vitals/Pain Pain Assessment: Faces Faces Pain Scale: Hurts little more Pain Location: bottom due to constipation Pain Descriptors / Indicators: Cramping Pain Intervention(s): Limited activity within patient's tolerance;Monitored during session;Repositioned           PT Goals (current goals can now be found in the care plan section) Acute Rehab PT Goals Patient Stated Goal: to get home Progress towards PT goals: Progressing toward  goals    Frequency    Min 4X/week      PT Plan Current plan remains appropriate    Co-evaluation PT/OT/SLP Co-Evaluation/Treatment: Yes Reason for Co-Treatment: Complexity of the patient's impairments (multi-system involvement);Necessary to address cognition/behavior during functional activity;For patient/therapist safety;To address functional/ADL transfers PT goals addressed during session: Mobility/safety with mobility;Balance;Strengthening/ROM        AM-PAC PT "6 Clicks" Daily Activity  Outcome Measure  Difficulty turning over in bed (including adjusting bedclothes, sheets and blankets)?: Unable Difficulty moving from lying on back to sitting on the side of the bed? : Unable Difficulty sitting down on and standing up from a chair with arms (e.g., wheelchair, bedside commode, etc,.)?: Unable Help needed moving to and from a bed to chair (including a wheelchair)?: A Little Help needed walking in hospital room?: A Little Help needed climbing 3-5 steps with a railing? : A Lot 6 Click Score: 11    End of Session Equipment Utilized During Treatment: Gait belt Activity Tolerance: Patient limited by fatigue;Other (comment)(limited by anxiety) Patient left: in chair;with call bell/phone within reach;with chair alarm set;with family/visitor present Nurse Communication: Mobility status PT Visit Diagnosis: Other abnormalities of gait and mobility (R26.89);Muscle weakness (generalized) (M62.81);Difficulty in walking, not elsewhere classified (R26.2);Other symptoms and signs involving the nervous system (Y70.623)     Time: 1349-1430 PT Time Calculation (min) (ACUTE ONLY): 41 min  Charges:  $Therapeutic Activity: 23-37 mins  Jlynn Langille B. Franklin, [place], DPT [phone]                     03/13/2017, 12:23 AM

## 2017-03-13 NOTE — Progress Notes (Signed)
Jamse Arn, MD  Physician  Physical Medicine and Rehabilitation  Consult Note  Signed  Date of Service:  03/12/2017 6:33 AM       Related encounter: ED to Hosp-Admission (Current) from 03/06/2017 in [place] All Collapse All      [] Hide copied text  [] Hover for details        Physical Medicine and Rehabilitation Consult Reason for Consult: Decreased functional mobility Referring Physician: Dr. Kathyrn Sheriff   HPI: Daniel [name] is a 82 y.o. right handed male with history of Alzheimer's disease maintained on Exelon patch, diabetes mellitus, hypertension as well as history of subdural hygroma that has been monitored since November 2018.  History taken from chart review and patient.  Patient lives with spouse and son. Multiple level home. 2 steps to entry. Reported to be independent prior to admission. Presented 03/06/2017 for shuffling gait instability and generalized decline over 10-14 days as well as cognitive decline. Follow-up cranial CT scan reviewed, showing bilateral SDH.  Per report, bilateral acute on chronic subdural hematomas with more prominent acute component in the left subdural hematoma compared to prior exam. There was a 4 mm left to right midline shift. Underwent bilateral burr hole evacuation of chronic subdural hematoma 03/09/2017 per Dr. Kathyrn Sheriff. Follow-up CT scan 03/10/2017 showed new large volume right extra-axial pneumocephalus measuring 3 cm transaxial dimension concerning for tension pneumocephalus with follow-up cranial CT scan pending 03/14/2017. Keppra for seizure prophylaxis. Tolerating a regular diet. Physical therapy evaluation completed with recommendations of physical medicine rehabilitation consult.   Review of Systems  Constitutional: Negative for chills and fever.  HENT: Negative for hearing loss.   Eyes: Negative for blurred vision and double vision.  Respiratory: Negative for  cough and shortness of breath.   Cardiovascular: Negative for chest pain, palpitations and leg swelling.  Gastrointestinal: Positive for constipation. Negative for nausea and vomiting.       GERD  Genitourinary: Positive for urgency. Negative for dysuria, flank pain and hematuria.  Musculoskeletal: Positive for myalgias.  Skin: Negative for rash.  Neurological:       Gait instability  Psychiatric/Behavioral: Positive for memory loss.  All other systems reviewed and are negative.      Past Medical History:  Diagnosis Date  . Alzheimer disease   . Arthritis   . Cancer (HCC)    Skin  . Diabetes mellitus without complication ([place])   . GERD (gastroesophageal reflux disease)   . Hyperlipidemia   . Hypertension         Past Surgical History:  Procedure Laterality Date  . BACK SURGERY    . BURR HOLE Bilateral 03/09/2017   Procedure: BURR HOLES FOR SUBDURAL HEMATOMA BILATERAL;  Surgeon: Consuella Lose, MD;  Location: [facility];  Service: Neurosurgery;  Laterality: Bilateral;  . CATARACT EXTRACTION Bilateral 1987 89  . ROTATOR CUFF REPAIR Left 2003        Family History  Problem Relation Age of Onset  . Alzheimer's disease Mother   . Alzheimer's disease Maternal Grandfather    Social History:  reports that he quit smoking about 39 years ago. he has never used smokeless tobacco. He reports that he drinks alcohol. He reports that he does not use drugs. Allergies:       Allergies  Allergen Reactions  . Donepezil Diarrhea  . Tape Rash    PAPER TAPE IS PREFERRED, PLEASE  Medications Prior to Admission  Medication Sig Dispense Refill  . Alpha Lipoic Acid 200 MG CAPS Take 200 mg by mouth daily.     Marland Kitchen atorvastatin (LIPITOR) 40 MG tablet Take 40 mg by mouth daily at 6 (six) AM.     . celecoxib (CELEBREX) 200 MG capsule Take 200 mg by mouth daily.     . Cholecalciferol ([employer] D3) 2000 units CAPS Take 2,000 Units by mouth daily.     Marland Kitchen ezetimibe  (ZETIA) 10 MG tablet Take 10 mg by mouth every evening.   3  . LYRICA 50 MG capsule Take 50 mg by mouth 2 (two) times daily.   3  . metFORMIN (GLUCOPHAGE) 500 MG tablet Take 500 mg by mouth 2 (two) times daily.  2  . Multiple Minerals-Vitamins (CALCIUM-MAGNESIUM-ZINC-D3) TABS Take 1 tablet by mouth daily.     . Multiple Vitamin (MULTIVITAMIN WITH MINERALS) TABS Take 1 tablet by mouth daily.    . naproxen sodium (ALEVE) 220 MG tablet Take 440 mg by mouth 2 (two) times daily as needed (for lower back pain).    . pantoprazole (PROTONIX) 40 MG tablet Take 40 mg by mouth daily before breakfast.     . ramipril (ALTACE) 2.5 MG capsule Take 2.5 mg by mouth daily.     . rivastigmine (EXELON) 9.5 mg/24hr Place 1 patch (9.5 mg total) onto the skin daily. (Patient taking differently: Place 9.5 mg onto the skin daily. 0700) 30 patch 11  . sodium chloride (OCEAN) 0.65 % SOLN nasal spray Place 1 spray into both nostrils every morning.      Home: Home Living Family/patient expects to be discharged to:: Private residence Living Arrangements: Spouse/significant other, Children Available Help at Discharge: Family Type of Home: House Home Access: Stairs to enter Technical brewer of Steps: 2 Entrance Stairs-Rails: Right Home Layout: Multi-level Alternate Level Stairs-Number of Steps: 8 Alternate Level Stairs-Rails: Left Bathroom Shower/Tub: Tub/shower unit, Multimedia programmer: Handicapped height Home Equipment: Shower seat - built in, Museum/gallery curator bars - tub/shower  Functional History: Prior Function Level of Independence: Independent Functional Status:  Mobility: Bed Mobility Overal bed mobility: Needs Assistance Bed Mobility: Rolling, Sidelying to Sit Rolling: Min assist Sidelying to sit: Mod assist General bed mobility comments: min assist to roll to sidelying, moderate assist to elevate trunk to upright position at EOB, patient limited by dizziness with transitional  movement Transfers Overall transfer level: Needs assistance Equipment used: 1 person hand held assist Transfers: Sit to/from Stand, Stand Pivot Transfers Sit to Stand: Mod assist Stand pivot transfers: Mod assist General transfer comment: moderate assist to power to upright and provide stability. Initially positioned with wrap around assist but transitioned to face to face prior to pivotal steps to chair. Patient able to take festinating steps to chair with increased physical assist as well as increased time and effort Ambulation/Gait General Gait Details: shuffling steps to chair during pivot  ADL:  Cognition: Cognition Overall Cognitive Status: History of cognitive impairments - at baseline Orientation Level: Oriented X4 Cognition Arousal/Alertness: Awake/alert Behavior During Therapy: Flat affect(tangential) Overall Cognitive Status: History of cognitive impairments - at baseline Area of Impairment: Memory, Problem solving, Attention Current Attention Level: Selective Memory: Decreased short-term memory Problem Solving: Slow processing, Requires verbal cues, Requires tactile cues, Difficulty sequencing  Blood pressure (!) 150/78, pulse 68, temperature 98 F (36.7 C), temperature source Axillary, resp. rate 18, height 6\' 1"  (1.854 m), weight 70.3 kg (155 lb), SpO2 92 %. Physical Exam  Vitals reviewed. Constitutional:  He is oriented to person, place, and time. He appears well-developed and well-nourished.  HENT:  Bilateral burr hole sites are dressed  Eyes: EOM are normal. Right eye exhibits no discharge. Left eye exhibits no discharge.  Neck: Normal range of motion. Neck supple. No thyromegaly present.  Cardiovascular: Normal rate, regular rhythm and normal heart sounds.  Respiratory: Effort normal and breath sounds normal. No respiratory distress.  GI: Soft. Bowel sounds are normal. He exhibits no distension.  Musculoskeletal:  No edema or tenderness in extremities    Neurological: He is alert and oriented to person, place, and time.  Makes good eye contact with examiner.  Follows simple commands.  He is frustrated over memory loss. Motor: 4+/5 grossly throughout  Skin:  Bilateral scalp incisions with dried blood in dressing.  Psychiatric: He has a normal mood and affect. His behavior is normal. Thought content normal.    LabResultsLast24Hours  Results for orders placed or performed during the hospital encounter of 03/06/17 (from the past 24 hour(s))  Glucose, capillary     Status: Abnormal   Collection Time: 03/11/17  7:49 AM  Result Value Ref Range   Glucose-Capillary 131 (H) 65 - 99 mg/dL   Comment 1 Notify RN    Comment 2 Document in Chart   Glucose, capillary     Status: Abnormal   Collection Time: 03/11/17 12:20 PM  Result Value Ref Range   Glucose-Capillary 144 (H) 65 - 99 mg/dL   Comment 1 Notify RN    Comment 2 Document in Chart      ImagingResults(Last48hours)  No results found.    Assessment/Plan: Diagnosis: Bilateral subdural hematomas with midline shift Labs and images independently reviewed.  Records reviewed and summated above.  1. Does the need for close, 24 hr/day medical supervision in concert with the patient's rehab needs make it unreasonable for this patient to be served in a less intensive setting? Yes  2. Co-Morbidities requiring supervision/potential complications: subdural hygroma, Alzheimer's disease (continue meds), steroid-induced hyperglycemia on DM (Monitor in accordance with exercise and adjust meds as necessary), HTN (monitor and provide prns in accordance with increased physical exertion and pain), tachypnea (monitor RR and O2 Sats with increased physical exertion), leukocytosis (cont to monitor for signs and symptoms of infection, further workup if indicated) 3. Due to safety, skin/wound care, disease management, pain management and patient education, does the patient require 24  hr/day rehab nursing? Yes 4. Does the patient require coordinated care of a physician, rehab nurse, PT (1-2 hrs/day, 5 days/week), OT (1-2 hrs/day, 5 days/week) and SLP (1-2 hrs/day, 5 days/week) to address physical and functional deficits in the context of the above medical diagnosis(es)? Yes Addressing deficits in the following areas: balance, endurance, locomotion, strength, transferring, bathing, dressing, toileting, cognition and psychosocial support 5. Can the patient actively participate in an intensive therapy program of at least 3 hrs of therapy per day at least 5 days per week? Yes 6. The potential for patient to make measurable gains while on inpatient rehab is excellent 7. Anticipated functional outcomes upon discharge from inpatient rehab are supervision  with PT, supervision with OT, supervision with SLP. 8. Estimated rehab length of stay to reach the above functional goals is: 10-14 days. 9. Anticipated D/C setting: Home 10. Anticipated post D/C treatments: HH therapy and Home excercise program 11. Overall Rehab/Functional Prognosis: good  RECOMMENDATIONS: This patient's condition is appropriate for continued rehabilitative care in the following setting: CIR pending completion of medical workup if pt agreeable (currently feels  he is close to baseline).   Patient has agreed to participate in recommended program. Potentially Note that insurance prior authorization may be required for reimbursement for recommended care.  Comment: Rehab Admissions Coordinator to follow up.  Delice Lesch, MD, ABPMR Lavon Paganini Angiulli, PA-C 03/12/2017          Revision History                        Routing History

## 2017-03-13 NOTE — Progress Notes (Signed)
Received patient from [facility] ICU, assessment completed, VS documented, oriented patient to the room.  Will continue to monitor.

## 2017-03-13 NOTE — Progress Notes (Signed)
Cristina Gong, RN  Rehab Admission Coordinator  Physical Medicine and Rehabilitation  PMR Pre-admission  Signed  Date of Service:  03/13/2017 4:24 PM       Related encounter: ED to Hosp-Admission (Current) from 03/06/2017 in [place]           [] Hide copied text  [] Hover for details   PMR Admission Coordinator Pre-Admission Assessment  Patient: Daniel [name] is an 82 y.o., male MRN: [id number] DOB: [birth year] Height: 6\' 1"  (185.4 cm) Weight: 70.3 kg (155 lb)                                                                                                                                                  Insurance Information HMO:     PPO: yes     PCP:      IPA:      80/20:      OTHER: medicare advantage plan PRIMARY: [employer]      Policy#: [id number]      Subscriber: pt CM Name: Manuela Schwartz      Phone#: [phone]     Fax#: Epic access Pre-Cert#: [id number] approved for 10 days      Employer: retired Benefits:  Phone #: [phone]     Name: 03/12/2017 Eff. Date: 01/16/2017     Deduct: none      Out of Pocket Max: $3100      Life Max: none CIR: $250 co pay day 1; $125 co pay per day days 2 until 6      SNF: no copy days 1 to 10; $160 co pay per day days 21-100 Outpatient: $10 co pay per visit     Co-Pay: visits per medical neccesity Home Health: 100%      Co-Pay: visits per medical neccesity DME: 80%     Co-Pay: 20% Providers: in network  SECONDARY: none      Medicaid Application Date:       Case Manager:  Disability Application Date:       Case Worker:   Emergency Tax adviser Information    Name Relation Home Work Advance Spouse [phone]  [phone]     Current Medical History  Patient Admitting Diagnosis: bilateral SDH  History of Present Illness:  WPY:KDXIP A Stanleyis a 82 y.o.right handed malewith history of Alzheimer's disease maintained on [employer]  patch, diabetes mellitus, hypertensionas well as history of subdural hygroma that has been monitored since November 2018. Presented2/19/2019for shuffling gait instability and generalized decline over 10-14 days as well as cognitive decline. Follow-up cranial CT scan reviewed, showing bilateral SDH. Per report, bilateral acute on chronic subdural hematomas with more prominent acute component in the left subdural hematoma compared to prior exam. There was a  4 mm left to right midline shift. Underwent bilateral burr hole evacuation of chronic subdural hematoma 03/09/2017 per Dr. Kathyrn Sheriff. Follow-up CT scan 03/10/2017 showed new large volume right extra-axial pneumocephalus measuring 3 cm transaxial dimension concerning for tension pneumocephalusand continue to be monitored closely by neurosurgery.Keppra for seizure prophylaxis. Tolerating a regular diet.   Past Medical History      Past Medical History:  Diagnosis Date  . Alzheimer disease   . Arthritis   . Cancer (HCC)    Skin  . Diabetes mellitus without complication ([place])   . GERD (gastroesophageal reflux disease)   . Hyperlipidemia   . Hypertension     Family History  family history includes Alzheimer's disease in his maternal grandfather and mother.  Prior Rehab/Hospitalizations:  Has the patient had major surgery during 100 days prior to admission? No  Back surgery 08/2016 insurance denied CIR admit so patient went to [facility] for rehab  Current Medications   Current Facility-Administered Medications:  .  0.9 %  sodium chloride infusion, , Intravenous, Continuous, Costella, Vista Mink, PA-C, Stopped at 03/11/17 1400 .  acetaminophen (TYLENOL) tablet 650 mg, 650 mg, Oral, Q4H PRN, 650 mg at 03/13/17 0338 **OR** acetaminophen (TYLENOL) suppository 650 mg, 650 mg, Rectal, Q4H PRN, Costella, Vincent J, PA-C .  atorvastatin (LIPITOR) tablet 40 mg, 40 mg, Oral, q1800, Consuella Lose, MD, 40 mg at 03/12/17 1814 .   bisacodyl (DULCOLAX) suppository 10 mg, 10 mg, Rectal, Daily PRN, Costella, Vincent J, PA-C .  calcium-vitamin D (OSCAL WITH D) 500-200 MG-UNIT per tablet 1 tablet, 1 tablet, Oral, Q breakfast, Consuella Lose, MD, 1 tablet at 03/13/17 0919 .  cholecalciferol (VITAMIN D) tablet 2,000 Units, 2,000 Units, Oral, Daily, Costella, Vincent Lenna Sciara, PA-C, 2,000 Units at 03/13/17 0920 .  ezetimibe (ZETIA) tablet 10 mg, 10 mg, Oral, QPM, Costella, Vincent J, PA-C, 10 mg at 03/12/17 1814 .  feeding supplement (GLUCERNA SHAKE) (GLUCERNA SHAKE) liquid 237 mL, 237 mL, Oral, TID BM, Consuella Lose, MD, 237 mL at 03/12/17 2045 .  hydrALAZINE (APRESOLINE) injection 20 mg, 20 mg, Intravenous, Q8H PRN, Ditty, Kevan Ny, MD, 20 mg at 03/13/17 0338 .  HYDROcodone-acetaminophen (NORCO/VICODIN) 5-325 MG per tablet 1 tablet, 1 tablet, Oral, Q4H PRN, Costella, Vista Mink, PA-C, 1 tablet at 03/11/17 2120 .  insulin aspart (novoLOG) injection 0-5 Units, 0-5 Units, Subcutaneous, QHS, Costella, Vincent Lenna Sciara, PA-C, 2 Units at 03/10/17 2243 .  insulin aspart (novoLOG) injection 0-9 Units, 0-9 Units, Subcutaneous, TID WC, Costella, Vincent J, PA-C, 2 Units at 03/13/17 1315 .  labetalol (NORMODYNE,TRANDATE) injection 10-40 mg, 10-40 mg, Intravenous, Q10 min PRN, Ditty, Kevan Ny, MD, 20 mg at 03/08/17 0175 .  labetalol (NORMODYNE,TRANDATE) injection 10-40 mg, 10-40 mg, Intravenous, Q10 min PRN, Costella, Vincent J, PA-C, 20 mg at 03/09/17 1601 .  levETIRAcetam (KEPPRA) tablet 500 mg, 500 mg, Oral, BID, Newman Pies, MD, 500 mg at 03/13/17 0920 .  magnesium citrate solution 1 Bottle, 1 Bottle, Oral, Once PRN, Costella, Vista Mink, PA-C .  metFORMIN (GLUCOPHAGE) tablet 500 mg, 500 mg, Oral, BID WC, Costella, Vincent J, PA-C, 500 mg at 03/13/17 0920 .  methocarbamol (ROBAXIN) tablet 500 mg, 500 mg, Oral, Q6H PRN, Costella, Vista Mink, PA-C .  multivitamin with minerals tablet 1 tablet, 1 tablet, Oral, Daily, Costella, Vista Mink, PA-C, 1 tablet at 03/13/17 0919 .  naloxone [facility]) injection 0.08 mg, 0.08 mg, Intravenous, PRN, Costella, Vincent J, PA-C .  ondansetron (ZOFRAN) tablet 4 mg, 4 mg, Oral, Q4H PRN **  OR** ondansetron (ZOFRAN) injection 4 mg, 4 mg, Intravenous, Q4H PRN, Costella, Vincent J, PA-C .  pantoprazole (PROTONIX) EC tablet 40 mg, 40 mg, Oral, QHS, Consuella Lose, MD, 40 mg at 03/12/17 2045 .  pregabalin (LYRICA) capsule 50 mg, 50 mg, Oral, BID, Costella, Vincent J, PA-C, 50 mg at 03/13/17 0920 .  promethazine (PHENERGAN) tablet 12.5 mg, 12.5 mg, Oral, Q6H PRN, Costella, Vincent J, PA-C .  promethazine (PHENERGAN) tablet 12.5-25 mg, 12.5-25 mg, Oral, Q4H PRN, Costella, Vincent J, PA-C .  ramipril (ALTACE) capsule 2.5 mg, 2.5 mg, Oral, Daily, Costella, Vincent J, PA-C, 2.5 mg at 03/13/17 0954 .  rivastigmine (EXELON) 9.5 mg/24hr 9.5 mg, 9.5 mg, Transdermal, Daily, Costella, Vincent J, PA-C, 9.5 mg at 03/13/17 0947 .  senna (SENOKOT) tablet 8.6 mg, 1 tablet, Oral, BID, Costella, Vincent J, PA-C, 8.6 mg at 03/13/17 0920 .  senna-docusate (Senokot-S) tablet 1 tablet, 1 tablet, Oral, QHS PRN, Costella, Vista Mink, PA-C .  zolpidem (AMBIEN) tablet 5 mg, 5 mg, Oral, QHS PRN, Costella, Vista Mink, PA-C, 5 mg at 03/08/17 2104  Patients Current Diet: Seizure precautions Diet regular Room service appropriate? Yes; Fluid consistency: Thin  Precautions / Restrictions Precautions Precautions: Fall Restrictions Weight Bearing Restrictions: No   Has the patient had 2 or more falls or a fall with injury in the past year?No  Prior Activity Level Community (5-7x/wk): has not driven since back surgery 08/2016  Home Assistive Devices / [place] Devices/Equipment: Kasandra Knudsen (specify quad or straight) Home Equipment: Shower seat - built in, [employer] - tub/shower  Prior Device Use: Indicate devices/aids used by the patient prior to current illness, exacerbation or injury? None of the  above  Prior Functional Level Prior Function Level of Independence: (prior to 2 weeks pta, pt not usisng cane or RW; decline in f) Comments: pt reports he occasionally used a [facility]   Self Care: Did the patient need help bathing, dressing, using the toilet or eating?  Independent  Indoor Mobility: Did the patient need assistance with walking from room to room (with or without device)? Independent  Stairs: Did the patient need assistance with internal or external stairs (with or without device)? Independent  Functional Cognition: Did the patient need help planning regular tasks such as shopping or remembering to take medications? Needed some help   Was Independent without AD until 2 weeks pta then sudden decline physically and was no longer talking which was unlike him. Patient typically very social.  Current Functional Level Cognition  Overall Cognitive Status: History of cognitive impairments - at baseline Current Attention Level: Selective Orientation Level: Oriented X4 General Comments: Pt anxious and self distracts over need for bowel movement.   I am familiar with this pt from a previous admission and he is close to his cognitive baseline.  I will need to speak with his wife to figure out what his most reccent physical baseline was.     Extremity Assessment (includes Sensation/Coordination)  Upper Extremity Assessment: Generalized weakness  Lower Extremity Assessment: Defer to PT evaluation RLE Coordination: decreased fine motor, decreased gross motor LLE Coordination: decreased fine motor, decreased gross motor    ADLs  Overall ADL's : Needs assistance/impaired Eating/Feeding: Independent Grooming: Wash/dry hands, Wash/dry face, Oral care, Brushing hair, Set up, Sitting Upper Body Bathing: Minimal assistance, Sitting Lower Body Bathing: Moderate assistance, Sit to/from stand Upper Body Dressing : Moderate assistance, Sitting Lower Body Dressing: Total assistance,  Sit to/from stand Toilet Transfer: Minimal assistance, +2 for physical assistance,  Stand-pivot, [facility] Toilet Transfer Details (indicate cue type and reason): assist to steady  Toileting- Clothing Manipulation and Hygiene: Total assistance, Sit to/from stand Toileting - Clothing Manipulation Details (indicate cue type and reason): Pt fatigues quickly with standing, and unable to unweight UEs for peri care and clothing manipulation  Functional mobility during ADLs: Minimal assistance, +2 for physical assistance General ADL Comments: Pt fixated on need to have BM.       Mobility  Overal bed mobility: Needs Assistance Bed Mobility: Supine to Sit Rolling: Min assist Sidelying to sit: Mod assist General bed mobility comments: assist to lift trunk , pt was able to progress bil legs to EOB unassisted.     Transfers  Overall transfer level: Needs assistance Equipment used: 2 person hand held assist Transfers: Sit to/from Stand, Stand Pivot Transfers Sit to Stand: Min assist, +2 safety/equipment Stand pivot transfers: Min assist, +2 safety/equipment General transfer comment: assist to steady.  Pt stood x 5 times for peri care.  He tolerated standing from 15-45" before becoming anxious and returning to sit.  He initially reported he felt off balance (despite holding onto therapist with both hands, and then admitted that he started to get shaky and had to sit down).      Ambulation / Gait / Stairs / Wheelchair Mobility  Ambulation/Gait Ambulation/Gait assistance: +2 physical assistance, Min assist Ambulation Distance (Feet): 8 Feet Assistive device: 2 person hand held assist Gait Pattern/deviations: Step-to pattern, Trunk flexed General Gait Details: Pt with short, choppy steps, especially noticeable when backing up to the chair.  Heavy min hand held assist needed for balance in standing.  I believe he would be safer to progress gait with RW initally before returning to a cane or no AD (per pt  he only occasionally used the cane before this decline).     Posture / Balance Dynamic Sitting Balance Sitting balance - Comments: maintains static sitting with min guard assist  Balance Overall balance assessment: Needs assistance Sitting-balance support: Feet supported Sitting balance-Leahy Scale: Fair Sitting balance - Comments: maintains static sitting with min guard assist  Standing balance support: Bilateral upper extremity supported, During functional activity Standing balance-Leahy Scale: Poor Standing balance comment: requires assist for balance     Special needs/care consideration BiPAP/CPAP  N/a CPM  N/a Continuous Drip IV  N/a Dialysis  N/a Life Vest  N/a Oxygen  N/a Special Bed  N/a Trach Size  N/a Wound Vac  N/a Skin surgical incisions with dressing and staples Bowel mgmt: continent LBM 03/12/2017 Bladder mgmt: incontinent; external catheter Diabetic mgmt n/a   Previous Home Environment Living Arrangements: (son Scote lives with parents; disabled due to Bipolar)  Lives With: Spouse, Son Available Help at Discharge: Family, Available 24 hours/day Type of Home: House Home Layout: Multi-level Alternate Level Stairs-Rails: Left Alternate Level Stairs-Number of Steps: 8 Home Access: Stairs to enter Entrance Stairs-Rails: Right Entrance Stairs-Number of Steps: 2 Bathroom Shower/Tub: Tub/shower unit, Multimedia programmer: Handicapped height Bathroom Accessibility: Yes How Accessible: Accessible via walker Home Care Services: No Additional Comments: wife confirmed PLOF  Discharge Living Setting Plans for Discharge Living Setting: Patient's home, Lives with (comment)(wife and adult disabled son) Type of Home at Discharge: House Discharge Home Layout: Multi-level Alternate Level Stairs-Rails: Left Alternate Level Stairs-Number of Steps: 8 Discharge Home Access: Stairs to enter Entrance Stairs-Rails: Right Entrance Stairs-Number of Steps:  2 Discharge Bathroom Shower/Tub: Tub/shower unit, Walk-in shower Discharge Bathroom Toilet: Handicapped height Discharge Bathroom Accessibility: Yes How Accessible: Accessible via  walker Does the patient have any problems obtaining your medications?: No  Social/Family/Support Systems Patient Roles: Spouse, Parent(retired Materials engineer) Contact Information: [name], wife Anticipated Caregiver: wife and son, Event organiser Anticipated Ambulance person Information: see above Ability/Limitations of Caregiver: no limitations Caregiver Availability: 24/7 Discharge Plan Discussed with Primary Caregiver: Yes Is Caregiver In Agreement with Plan?: Yes Does Caregiver/Family have Issues with Lodging/Transportation while Pt is in Rehab?: No  Goals/Additional Needs Patient/Family Goal for Rehab: supervision with PT, OT, and SLP Expected length of stay: ELOS 10 to 14 days Pt/Family Agrees to Admission and willing to participate: Yes Program Orientation Provided & Reviewed with Pt/Caregiver Including Roles  & Responsibilities: Yes  Decrease burden of Care through IP rehab admission: n/a  Possible need for SNF placement upon discharge: not anticipated  Patient Condition: This patient's medical and functional status has changed since the consult dated 03/12/2017 in which the Rehabilitation Physician determined and documented that the patient was potentially appropriate for intensive rehabilitative care in an inpatient rehabilitation facility. Issues have been addressed and update has been discussed with Dr. Posey Pronto and patient now appropriate for inpatient rehabilitation. Will admit to inpatient rehab today.   Preadmission Screen Completed By:  Cleatrice Burke, 03/13/2017 4:24 PM ______________________________________________________________________   Discussed status with Dr. Posey Pronto on 03/13/2017 at  1634 and received telephone approval for admission today.  Admission Coordinator:  Cleatrice Burke, time 2979 Date 03/13/2017             Cosigned by: Jamse Arn, MD at 03/13/2017 4:59 PM  Revision History

## 2017-03-13 NOTE — Progress Notes (Signed)
Nutrition Follow-up  DOCUMENTATION CODES:   Non-severe (moderate) malnutrition in context of chronic illness  INTERVENTION:   RD to decrease frequency of Glucerna from TID to BID.  RD to order Ensure Enlive x1/daily, each supplement provides 350kcals and 20g of protein.  RD to continue MVI.  NUTRITION DIAGNOSIS:   Moderate Malnutrition related to chronic illness(dementia) as evidenced by percent weight loss, energy intake < or equal to 75% for > or equal to 1 month, moderate fat depletion, moderate muscle depletion, severe muscle depletion. Ongoing.  GOAL:   Patient will meet greater than or equal to 90% of their needs Not met.  MONITOR:   PO intake, Supplement acceptance, Weight trends, Labs, I & O's  REASON FOR ASSESSMENT:   Malnutrition Screening Tool    ASSESSMENT:   82 y.o male with PMH significant for Alzheimer's dementia, DM, HTN, diabetic neuropathy, and subdural hygroma that has been closely monitored since 11/2016. S/P evacuation of hygroma on 2/22.  Pt reports having a "so-so" appetite and eating 50% of meals yesterday and this morning for breakfast. At time of visit (3:00 PM) pt had not received lunch. Spoke with RN; RN to help pt order lunch.   Pt denies N/V/D but reports some constipation and feelings of gas/ abdominal cramps.   Pt amenable to switching nutrition supplement to better meet his calorie and protein needs for wound healing.  Medications: Calcium & vitamin D, vitamin D, insulin, MVI, protonix, senokot  Labs:  CBG (last 3)  Recent Labs    03/12/17 2134 03/13/17 0704 03/13/17 1226  GLUCAP 214* 177* 165*     Diet Order:  Seizure precautions Diet regular Room service appropriate? Yes; Fluid consistency: Thin  EDUCATION NEEDS:   Education needs have been addressed  Skin:  Skin Assessment: Skin Integrity Issues: Skin Integrity Issues:: Incisions Incisions: head  Last BM:  2/21 type 6 large  Height:   Ht Readings from Last 1  Encounters:  03/09/17 6' 1" (1.854 m)    Weight:   Wt Readings from Last 1 Encounters:  03/09/17 155 lb (70.3 kg)    Ideal Body Weight:  83.6 kg  BMI:  Body mass index is 20.45 kg/m.  Estimated Nutritional Needs:   Kcal:  2100-2300 kcal/day  Protein:  105-115 g/day  Fluid:  >2.1 L/day  Derian Dimalanta, MS, Dietetic Intern Pager # [phone]

## 2017-03-14 ENCOUNTER — Encounter (HOSPITAL_COMMUNITY): Payer: Self-pay | Admitting: Neurology

## 2017-03-14 ENCOUNTER — Inpatient Hospital Stay (HOSPITAL_COMMUNITY): Payer: PPO | Admitting: Occupational Therapy

## 2017-03-14 ENCOUNTER — Inpatient Hospital Stay (HOSPITAL_COMMUNITY): Payer: PPO

## 2017-03-14 ENCOUNTER — Other Ambulatory Visit: Payer: PPO

## 2017-03-14 ENCOUNTER — Inpatient Hospital Stay (HOSPITAL_COMMUNITY): Payer: PPO | Admitting: Physical Therapy

## 2017-03-14 DIAGNOSIS — E1142 Type 2 diabetes mellitus with diabetic polyneuropathy: Secondary | ICD-10-CM

## 2017-03-14 DIAGNOSIS — I10 Essential (primary) hypertension: Secondary | ICD-10-CM

## 2017-03-14 DIAGNOSIS — G9340 Encephalopathy, unspecified: Secondary | ICD-10-CM

## 2017-03-14 DIAGNOSIS — S065X9A Traumatic subdural hemorrhage with loss of consciousness of unspecified duration, initial encounter: Secondary | ICD-10-CM

## 2017-03-14 DIAGNOSIS — D62 Acute posthemorrhagic anemia: Secondary | ICD-10-CM

## 2017-03-14 LAB — CBC WITH DIFFERENTIAL/PLATELET
BASOS ABS: 0 10*3/uL (ref 0.0–0.1)
Basophils Relative: 0 %
Eosinophils Absolute: 0.2 10*3/uL (ref 0.0–0.7)
Eosinophils Relative: 2 %
HCT: 37 % — ABNORMAL LOW (ref 39.0–52.0)
HEMOGLOBIN: 12 g/dL — AB (ref 13.0–17.0)
LYMPHS PCT: 16 %
Lymphs Abs: 1.7 10*3/uL (ref 0.7–4.0)
MCH: 28.9 pg (ref 26.0–34.0)
MCHC: 32.4 g/dL (ref 30.0–36.0)
MCV: 89.2 fL (ref 78.0–100.0)
MONO ABS: 0.5 10*3/uL (ref 0.1–1.0)
MONOS PCT: 4 %
Neutro Abs: 7.8 10*3/uL — ABNORMAL HIGH (ref 1.7–7.7)
Neutrophils Relative %: 78 %
Platelets: 229 10*3/uL (ref 150–400)
RBC: 4.15 MIL/uL — ABNORMAL LOW (ref 4.22–5.81)
RDW: 14.2 % (ref 11.5–15.5)
WBC: 10.1 10*3/uL (ref 4.0–10.5)

## 2017-03-14 LAB — COMPREHENSIVE METABOLIC PANEL
ALK PHOS: 72 U/L (ref 38–126)
ALT: 15 U/L — ABNORMAL LOW (ref 17–63)
AST: 22 U/L (ref 15–41)
Albumin: 3.3 g/dL — ABNORMAL LOW (ref 3.5–5.0)
Anion gap: 12 (ref 5–15)
BILIRUBIN TOTAL: 0.9 mg/dL (ref 0.3–1.2)
BUN: 17 mg/dL (ref 6–20)
CALCIUM: 9.6 mg/dL (ref 8.9–10.3)
CO2: 24 mmol/L (ref 22–32)
Chloride: 99 mmol/L — ABNORMAL LOW (ref 101–111)
Creatinine, Ser: 0.84 mg/dL (ref 0.61–1.24)
GFR calc Af Amer: >60 mL/min (ref >60)
Glucose, Bld: 159 mg/dL — ABNORMAL HIGH (ref 65–99)
POTASSIUM: 4.1 mmol/L (ref 3.5–5.1)
Sodium: 135 mmol/L (ref 135–145)
TOTAL PROTEIN: 6.3 g/dL — AB (ref 6.5–8.1)

## 2017-03-14 LAB — GLUCOSE, CAPILLARY
GLUCOSE-CAPILLARY: 150 mg/dL — AB (ref 65–99)
GLUCOSE-CAPILLARY: 160 mg/dL — AB (ref 65–99)
Glucose-Capillary: 193 mg/dL — ABNORMAL HIGH (ref 65–99)
Glucose-Capillary: 169 mg/dL — ABNORMAL HIGH (ref 65–99)

## 2017-03-14 MED ORDER — PRO-STAT SUGAR FREE PO LIQD
30.0000 mL | Freq: Two times a day (BID) | ORAL | Status: DC
  Administered 2017-03-14 – 2017-03-28 (×28): 30 mL via ORAL
  Filled 2017-03-14 (×28): qty 30

## 2017-03-14 NOTE — Evaluation (Signed)
Speech Language Pathology Assessment and Plan  Patient Details  Name: Daniel [name] MRN: [id number] Date of Birth: 1935/12/24  SLP Diagnosis: (baseline deficits due to Alzheimers)  Rehab Potential: (At baseline memory, OT can address attention deficits) ELOS: N/A    Today's Date: 03/14/2017 SLP Individual Time: 0930-1030 SLP Individual Time Calculation (min): 60 min   Problem List:  Patient Active Problem List   Diagnosis Date Noted  . Subdural hematoma ([place]) 03/13/2017  . Encephalopathy   . Seizure prophylaxis   . Hyperlipidemia   . Slow transit constipation   . Steroid-induced hyperglycemia   . Diabetes mellitus type 2 in nonobese (HCC)   . Tachypnea   . Malnutrition of moderate degree 03/08/2017  . 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 Urinary frequency 11/16/2016  . Postprocedural hypotension   . Dural tear   . Alzheimer's dementia without behavioral disturbance   . Chronic back pain   . Post-operative pain   . Benign essential HTN   . Leukocytosis   . Acute blood loss anemia   . Thrombocytopenia ([place])   . Spondylolisthesis of lumbar region 09/12/2016  . Essential hypertension 09/12/2016  . Surgery, elective   . Diarrhea 06/08/2016  . Memory loss 03/09/2016  . Essential tremor 03/09/2016  . Diabetic neuropathy ([place]) 03/09/2016  . Vitamin D deficiency 03/09/2016   Past Medical History:  Past Medical History:  Diagnosis Date  . Alzheimer disease   . Arthritis   . Cancer (HCC)    Skin  . Diabetes mellitus without complication ([place])   . GERD (gastroesophageal reflux disease)   . Hyperlipidemia   . Hypertension    Past Surgical History:  Past Surgical History:  Procedure Laterality Date  . BACK SURGERY    . BURR HOLE Bilateral 03/09/2017   Procedure: BURR HOLES FOR SUBDURAL HEMATOMA BILATERAL;  Surgeon: Daniel Lose, MD;  Location: [facility];  Service: Neurosurgery;  Laterality: Bilateral;  . CATARACT EXTRACTION Bilateral 1987 89  . ROTATOR CUFF REPAIR Left  2003    Assessment / Plan / Recommendation Clinical Impression Daniel [name] a 82 y.o.right handed malewith history of Alzheimer's disease maintained on Exelon patch, diabetes mellitus, hypertensionas well as history of subdural hygroma that has been monitored since November 2018.History taken from chart review and patient. Patient lives with spouseand son. Multiple level home. 2 steps to entry. Reported to be independent prior to admission. Presented2/19/2019for shuffling gait instability and generalized decline over 10-14 days as well as cognitive decline. Follow-up cranial CT scan reviewed, showing bilateral SDH. Per report, bilateral acute on chronic subdural hematomas with more prominent acute component in the left subdural hematoma compared to prior exam. There was a 4 mm left to right midline shift. Underwent bilateral burr hole evacuation of chronic subdural hematoma 03/09/2017 per Dr. Kathyrn Sheriff. 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 Keppra for seizure prophylaxis. Tolerating a regular diet. Physical therapy evaluation completed with recommendations of physical medicine rehabilitation consult. Patient is admitted for a comprehensive rehabilitation program.  Pt admitted to CIR on 03/13/2017 and evaluated for swallow and cognitive linguistic skills on 03/14/2017.Pt presents with mild impairments in alterning attention, semi-complex problem solving specific to clock drawling task and moderate short term recall deficits, resulting in a score of 21 out of 30 on MOCA version 7.1 (n=>26.)  However short term recall and semi-complex problem solving are noted as baseline cognitive linguistic  impairments due to Alzheimer's and confirmed by Pt's wife via phone. Pt demonstrated mild flat affect, however pragmatic ability in  functional in semi-complex situations and not other speech impairments noted. Pt presents with functional swallow skills and oral motor function. SLP recommends regular textured and thin liquid diet. SLP communicated with OT about minimum impairment in higher level attention skills, which can be address with OT if needed. Pt would not benefit from skilled ST services in order to maximize functional independence and reduce burden of care prior to discharge since he his cognitive linguistic function is at baseline.    Skilled Therapeutic Interventions          Skilled ST services focused on family education skills. SLP communicated with pt's wife via phone, she stated pt is at cognitive baseline with slight noted changes in affect and higher level attention. SLP educated pt and wife that OT can address attention deficits if needed and pt demonstrates functional pragmatic skills at semi-complex level.Pt agreed skilled ST services are not needed at this time. Pt was left in room with call bell within reach. Pt does not require skilled ST services due to cognitive baseline and OT addressing slight attention impairment if needed.    SLP Assessment  Patient does not need any further Speech Lanaguage Pathology Services(cognitive baseline memory and high level attention, OT will address attention)    Recommendations  SLP Diet Recommendations: Thin Liquid Administration via: Cup;Straw Medication Administration: Whole meds with liquid Supervision: Patient able to self feed Compensations: Minimize environmental distractions;Small sips/bites Postural Changes and/or Swallow Maneuvers: Seated upright 90 degrees Oral Care Recommendations: Oral care BID Patient destination: Home Follow up Recommendations: None Equipment Recommended: None recommended by SLP    SLP Frequency   N/A  SLP Duration  SLP Intensity  SLP Treatment/Interventions N/A   N/A    N/A   Pain Pain Assessment Pain Assessment: No/denies  pain Pain Score: 0-No pain  Prior Functioning Cognitive/Linguistic Baseline: Baseline deficits Baseline deficit details: [place] Type of Home: House  Lives With: Spouse;Son Available Help at Discharge: Family;Available 24 hours/day  Function:  Eating Eating   Modified Consistency Diet: No Eating Assist Level: Set up assist for   Eating Set Up Assist For: Opening containers;Cutting food       Cognition Comprehension Comprehension assist level: Understands basic 75 - 89% of the time/ requires cueing 10 - 24% of the time  Expression   Expression assist level: Expresses basic 75 - 89% of the time/requires cueing 10 - 24% of the time. Needs helper to occlude trach/needs to repeat words.  Social Interaction Social Interaction assist level: Interacts appropriately 75 - 89% of the time - Needs redirection for appropriate language or to initiate interaction.  Problem Solving Problem solving assist level: Solves basic 75 - 89% of the time/requires cueing 10 - 24% of the time  Memory Memory assist level: Recognizes or recalls 50 - 74% of the time/requires cueing 25 - 49% of the time   Short Term Goals: No short term goals set  Refer to Care Plan for Long Term Goals  Recommendations for other services: None   Discharge Criteria: Patient will be discharged from SLP if patient refuses treatment 3 consecutive times without medical reason, if treatment goals not met, if there is a change in medical status, if patient makes no progress towards goals or if patient is discharged from hospital.  The above assessment, treatment plan, treatment alternatives and goals were discussed and mutually agreed upon: by patient  and by family  Mayson Mcneish  [facility] 03/14/2017, 1:01 PM

## 2017-03-14 NOTE — Progress Notes (Signed)
Patient information reviewed and entered into eRehab system by Ryota Treece, RN, CRRN, PPS Coordinator.  Information including medical coding and functional independence measure will be reviewed and updated through discharge.     Per nursing patient was given "Data Collection Information Summary for Patients in Inpatient Rehabilitation Facilities with attached "Privacy Act Statement-Health Care Records" upon admission.  

## 2017-03-14 NOTE — Evaluation (Signed)
Physical Therapy Assessment and Plan  Patient Details  Name: Daniel [name] MRN: [id number] Date of Birth: Nov 21, 1935  PT Diagnosis: Abnormality of gait, Coordination disorder, Difficulty walking and Muscle weakness Rehab Potential: Good ELOS: 18-21   Today's Date: 03/14/2017 PT Individual Time: 3710-6269 PT Individual Time Calculation (min): 85 min    Problem List:  Patient Active Problem List   Diagnosis Date Noted  . Type 2 diabetes mellitus with peripheral neuropathy (HCC)   . Subdural hematoma ([place]) 03/13/2017  . Encephalopathy   . Seizure prophylaxis   . Hyperlipidemia   . Slow transit constipation   . Steroid-induced hyperglycemia   . Diabetes mellitus type 2 in nonobese (HCC)   . Tachypnea   . Malnutrition of moderate degree 03/08/2017  . 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 Urinary frequency 11/16/2016  . Postprocedural hypotension   . Dural tear   . Alzheimer's dementia without behavioral disturbance   . Chronic back pain   . Post-operative pain   . Benign essential HTN   . Leukocytosis   . Acute blood loss anemia   . Thrombocytopenia ([place])   . Spondylolisthesis of lumbar region 09/12/2016  . Essential hypertension 09/12/2016  . Surgery, elective   . Diarrhea 06/08/2016  . Memory loss 03/09/2016  . Essential tremor 03/09/2016  . Diabetic neuropathy ([place]) 03/09/2016  . Vitamin D deficiency 03/09/2016    Past Medical History:  Past Medical History:  Diagnosis Date  . Alzheimer disease   . Arthritis   . Cancer (HCC)    Skin  . Diabetes mellitus without complication ([place])   . GERD (gastroesophageal reflux disease)   . Hyperlipidemia   . Hypertension    Past Surgical History:  Past Surgical History:  Procedure Laterality Date  . BACK SURGERY    . BURR HOLE Bilateral 03/09/2017   Procedure: BURR HOLES FOR SUBDURAL HEMATOMA BILATERAL;  Surgeon: Consuella Lose, MD;  Location: [facility];  Service: Neurosurgery;  Laterality: Bilateral;  . CATARACT EXTRACTION  Bilateral 1987 89  . ROTATOR CUFF REPAIR Left 2003    Assessment & Plan Clinical Impression: CORIE VAVRA a 82 y.o.right handed malewith history of Alzheimer's disease maintained on Exelon patch, diabetes mellitus, hypertensionas well as history of subdural hygroma that has been monitored since November 2018. Presented2/19/2019for shuffling gait instability and generalized decline over 10-14 days as well as cognitive decline. Follow-up cranial CT scan reviewed, showing bilateral SDH. Per report, bilateral acute on chronic subdural hematomas with more prominent acute component in the left subdural hematoma compared to prior exam. There was a 4 mm left to right midline shift. Underwent bilateral burr hole evacuation of chronic subdural hematoma 03/09/2017 per Dr. Kathyrn Sheriff. Follow-up CT scan 03/10/2017 showed new large volume right extra-axial pneumocephalus measuring 3 cm transaxial dimension concerning for tension pneumocephalusand continue to be monitored closely by neurosurgery.Keppra for seizure prophylaxis. Tolerating a regular diet.  Patient transferred to CIR on 03/13/2017 .   Patient currently requires max with mobility secondary to muscle weakness, unbalanced muscle activation and decreased coordination and decreased sitting balance, decreased standing balance, decreased postural control and decreased balance strategies.  Prior to hospitalization, patient was modified independent  with mobility and lived with Spouse, Son in a House home.  Home access is 2Stairs to enter.  Patient will benefit from skilled PT intervention to maximize safe functional mobility, minimize fall risk and decrease caregiver burden for planned discharge home with 24 hour supervision.  Anticipate patient will benefit from follow up [place] at discharge.  PT - End of Session Activity Tolerance: Tolerates 10 - 20 min activity with multiple rests Endurance Deficit: Yes PT Assessment Rehab Potential (ACUTE/IP  ONLY): Good PT Patient demonstrates impairments in the following area(s): Safety;Endurance;Motor;Balance PT Transfers Functional Problem(s): Bed Mobility;Bed to Chair;Furniture;Floor;Car PT Locomotion Functional Problem(s): Ambulation;Stairs;Wheelchair Mobility PT Plan PT Intensity: Minimum of 1-2 x/day ,45 to 90 minutes PT Frequency: 5 out of 7 days PT Duration Estimated Length of Stay: 18-21 PT Treatment/Interventions: Ambulation/gait training;Balance/vestibular training;Community reintegration;Discharge planning;DME/adaptive equipment instruction;Functional mobility training;Neuromuscular re-education;Pain management;Patient/family education;Psychosocial support;Splinting/orthotics;Stair training;Therapeutic Activities;Therapeutic Exercise;UE/LE Strength taining/ROM;UE/LE Coordination activities;Visual/perceptual remediation/compensation PT Transfers Anticipated Outcome(s): supervision PT Locomotion Anticipated Outcome(s): supervision with LRAD for ambulation PT Recommendation Follow Up Recommendations: Home health PT;24 hour supervision/assistance Patient destination: Home Equipment Recommended: None recommended by OT  Skilled Therapeutic Intervention No c/o pain but reporting fatigue.  Session focus on pt education in rehab process, goals, ELOS, and plan of care, initiation of PT evaluation, and pt instruction in functional transfers and gait training.  Pt requiring frequent rest breaks throughout session 2/2 fatigue.  Returned to bed at end of session and positioned with call bell in reach and needs met, MD present as PT exiting.   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 Praxis Praxis: Intact  Cognition Overall Cognitive Status: History of cognitive impairments - at baseline Arousal/Alertness: Awake/alert Orientation Level: Oriented X4 Attention: Sustained Sustained Attention: Appears intact Awareness: Appears intact Problem Solving: Impaired Safety/Judgment: Appears intact Comments: memory, semi-complex problem solving and higher level attention baseline deficits with Alzheimers Sensation Sensation Light Touch: Appears Intact Proprioception: Appears Intact Coordination Gross Motor Movements are Fluid and Coordinated: No Fine Motor Movements are Fluid and Coordinated: No Finger Nose Finger Test: dysmetria bilaterally Heel Shin Test: R=L Motor  Motor Motor: Other (comment) Motor - Skilled Clinical Observations: mild deficits in motor planning  Mobility Bed Mobility Bed Mobility: Supine to Sit;Sit to Supine Supine to Sit: 3: Mod assist Sit to Supine: 5: Supervision Sit to Supine - Details (indicate cue type and reason): increased time and multiple attempts to get LEs into bed Transfers Transfers: Yes Sit to Stand: 2: Max assist Stand Pivot Transfers: 2: Max assist Locomotion  Ambulation Ambulation: Yes Ambulation/Gait Assistance: 4: Min assist Ambulation Distance (Feet): 30 Feet Assistive device: Other (Comment)(rail in hallway) Stairs / Additional Locomotion Stairs: No Wheelchair Mobility Wheelchair Mobility: No  Trunk/Postural Assessment  Cervical Assessment Cervical Assessment: (forward head, mild) Thoracic Assessment Thoracic Assessment: Within Functional Limits Lumbar Assessment Lumbar Assessment:  Exceptions to WFL(posterior pelvic tilt preference) Postural Control Postural Control: Deficits on evaluation Postural Limitations: decreased limits of stability  Balance Balance Balance Assessed: Yes Static Sitting Balance Static Sitting - Balance Support: Feet supported Static Sitting - Level of Assistance: 5: Stand by assistance(on commode) Dynamic Sitting Balance Dynamic Sitting - Balance Support: Feet supported;During functional activity Dynamic Sitting - Level of Assistance: 5: Stand by assistance Sitting balance - Comments: sitting on commode for hygiene Static Standing Balance Static Standing - Balance Support: Right upper extremity supported Static Standing - Level of Assistance:  4: Min assist Dynamic Standing Balance Dynamic Standing - Balance Support: During functional activity;Right upper extremity supported Dynamic Standing - Level of Assistance: 3: Mod assist;4: Min assist Extremity Assessment      RLE Strength Right Hip Flexion: 4/5 Right Knee Flexion: 4/5 Right Knee Extension: 4/5 Right Ankle Dorsiflexion: 4/5 Right Ankle Plantar Flexion: 4/5 LLE Assessment LLE Assessment: Exceptions to [facility] LLE Strength Left Hip Flexion: 3+/5 Left Knee Flexion: 4/5 Left Knee Extension: 4/5 Left Ankle Dorsiflexion: 4/5 Left Ankle Plantar Flexion: 4/5   See Function Navigator for Current Functional Status.   Refer to Care Plan for Long Term Goals  Recommendations for other services: None   Discharge Criteria: Patient will be discharged from PT if patient refuses treatment 3 consecutive times without medical reason, if treatment goals not met, if there is a change in medical status, if patient makes no progress towards goals or if patient is discharged from hospital.  The above assessment, treatment plan, treatment alternatives and goals were discussed and mutually agreed upon: by patient  Michel Santee 03/14/2017, 7:49 PM

## 2017-03-14 NOTE — Plan of Care (Signed)
  Progressing Consults [facility] BRAIN INJURY PATIENT EDUCATION Description Description: See Patient Education module for eduction specifics 03/14/2017 1622 - Progressing by Brita Romp, RN Skin Care Protocol Initiated - if Braden Score 18 or less Description If consults are not indicated, leave blank or document N/A 03/14/2017 1622 - Progressing by Brita Romp, RN Nutrition Consult-if indicated 03/14/2017 1622 - Progressing by Brita Romp, RN Diabetes Guidelines if Diabetic/Glucose > 140 Description If diabetic or lab glucose is > 140 mg/dl - Initiate Diabetes/Hyperglycemia Guidelines & Document Interventions  03/14/2017 1622 - Progressing by Brita Romp, RN RH BLADDER ELIMINATION RH STG MANAGE BLADDER WITH MEDICATION WITH ASSISTANCE Description STG Manage Bladder With Medication With min Assistance.  03/14/2017 1622 - Progressing by Brita Romp, RN RH SKIN INTEGRITY RH STG SKIN FREE OF INFECTION/BREAKDOWN Description Skin free of infection/breakdown with min assistance  03/14/2017 1622 - Progressing by Brita Romp, RN RH STG MAINTAIN SKIN INTEGRITY WITH ASSISTANCE Description STG Maintain Skin Integrity With min Assistance.  03/14/2017 1622 - Progressing by Brita Romp, RN RH STG ABLE TO PERFORM INCISION/WOUND CARE W/ASSISTANCE Description STG Able To Perform Incision/Wound Care With min Assistance.  03/14/2017 1622 - Progressing by Brita Romp, RN RH SAFETY RH STG ADHERE TO SAFETY PRECAUTIONS W/ASSISTANCE/DEVICE Description STG Adhere to Safety Precautions With supervision Assistance/Device.  03/14/2017 1622 - Progressing by Brita Romp, RN RH STG DECREASED RISK OF FALL WITH ASSISTANCE Description STG Decreased Risk of Fall With supervision Assistance.  03/14/2017 1622 - Progressing by Brita Romp, RN RH COGNITION-NURSING RH STG USES MEMORY AIDS/STRATEGIES W/ASSIST TO PROBLEM SOLVE Description STG Uses Memory  Aids/Strategies With mod Assistance to Problem Solve.  03/14/2017 1622 - Progressing by Brita Romp, RN RH STG ANTICIPATES NEEDS/CALLS FOR ASSIST W/ASSIST/CUES Description STG Anticipates Needs/Calls for Assist With min Assistance/Cues.  03/14/2017 1622 - Progressing by Brita Romp, RN RH PAIN MANAGEMENT RH STG PAIN MANAGED AT OR BELOW PT'S PAIN GOAL Description Pain < or = 4 with min assistance  03/14/2017 1622 - Progressing by Brita Romp, RN   Not Progressing RH BLADDER ELIMINATION RH STG MANAGE BLADDER WITH ASSISTANCE Description STG Manage Bladder With Assistance min  03/14/2017 1622 - Not Progressing by Brita Romp, RN RH STG MANAGE BLADDER WITH EQUIPMENT WITH ASSISTANCE Description STG Manage Bladder With Equipment With min Assistance  03/14/2017 1622 - Not Progressing by Brita Romp, RN   Condom cath off this am- unable to manage clothing independently and has had 2 accidents.

## 2017-03-14 NOTE — Progress Notes (Addendum)
Occupational Therapy Assessment and Plan  Patient Details  Name: Daniel [name] MRN: [id number] Date of Birth: 1935/12/22  OT Diagnosis: abnormal posture, ataxia, muscle weakness (generalized) and balance, and activity tolerance Rehab Potential: Rehab Potential (ACUTE ONLY): Good ELOS: 10-14 days   Today's Date: 03/14/2017 OT Individual Time: 0802-0900 OT Individual Time Calculation (min): 58 min     Problem List:  Patient Active Problem List   Diagnosis Date Noted  . Subdural hematoma ([place]) 03/13/2017  . Encephalopathy   . Seizure prophylaxis   . Hyperlipidemia   . Slow transit constipation   . Steroid-induced hyperglycemia   . Diabetes mellitus type 2 in nonobese (HCC)   . Tachypnea   . Malnutrition of moderate degree 03/08/2017  . 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 Urinary frequency 11/16/2016  . Postprocedural hypotension   . Dural tear   . Alzheimer's dementia without behavioral disturbance   . Chronic back pain   . Post-operative pain   . Benign essential HTN   . Leukocytosis   . Acute blood loss anemia   . Thrombocytopenia ([place])   . Spondylolisthesis of lumbar region 09/12/2016  . Essential hypertension 09/12/2016  . Surgery, elective   . Diarrhea 06/08/2016  . Memory loss 03/09/2016  . Essential tremor 03/09/2016  . Diabetic neuropathy ([place]) 03/09/2016  . Vitamin D deficiency 03/09/2016    Past Medical History:  Past Medical History:  Diagnosis Date  . Alzheimer disease   . Arthritis   . Cancer (HCC)    Skin  . Diabetes mellitus without complication ([place])   . GERD (gastroesophageal reflux disease)   . Hyperlipidemia   . Hypertension    Past Surgical History:  Past Surgical History:  Procedure Laterality Date  . BACK SURGERY    . BURR HOLE Bilateral 03/09/2017   Procedure: BURR HOLES FOR SUBDURAL HEMATOMA BILATERAL;  Surgeon: Consuella Lose, MD;  Location: [facility];  Service: Neurosurgery;  Laterality: Bilateral;  . CATARACT EXTRACTION Bilateral 1987  89  . ROTATOR CUFF REPAIR Left 2003    Assessment & Plan Clinical Impression: Patient is a 82 y.o. year old male with history of Alzheimer's disease maintained on Exelon patch, diabetes mellitus, hypertension as well as history of subdural hygroma that has been monitored since November 2018.  History taken from chart review and patient.  Patient lives with spouse and son. Multiple level home. 2 steps to entry. Reported to be independent prior to admission. Presented 03/06/2017 for shuffling gait instability and generalized decline over 10-14 days as well as cognitive decline. Follow-up cranial CT scan reviewed, showing bilateral SDH.  Per report, bilateral acute on chronic subdural hematomas with more prominent acute component in the left subdural hematoma compared to prior exam. There was a 4 mm left to right midline shift. Underwent bilateral burr hole evacuation of chronic subdural hematoma 03/09/2017 per Dr. Kathyrn Sheriff. 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  Keppra for seizure prophylaxis. Tolerating a regular diet. Physical therapy evaluation completed with recommendations of physical medicine rehabilitation consult. Patient is admitted for a comprehensive rehabilitation program. Patient transferred to CIR on 03/13/2017 .    Patient currently requires Mod/max with basic self-care skills secondary to muscle weakness, decreased coordination and decreased motor planning, decreased attention and decreased memory and decreased sitting balance, decreased standing balance, decreased postural control and decreased balance strategies.  Prior to hospitalization, patient could complete BADL  with modified independent .  Patient will benefit from skilled intervention to increase independence with basic self-care skills prior to discharge home  with care partner.  Anticipate patient will require 24 hour supervision and follow up home health.  OT - End of Session Endurance Deficit: Yes Endurance Deficit Description: Multiple rest breaks within BADL tasks OT Assessment Rehab Potential (ACUTE ONLY): Good OT Patient demonstrates impairments in the following area(s): Balance;Endurance;Motor;Safety OT Basic ADL's Functional Problem(s): Grooming;Bathing;Toileting;Dressing OT Transfers Functional Problem(s): Toilet;Tub/Shower OT Additional Impairment(s): None OT Plan OT Intensity: Minimum of 1-2 x/day, 45 to 90 minutes OT Frequency: 5 out of 7 days OT Duration/Estimated Length of Stay: 18-21 days OT Treatment/Interventions: Balance/vestibular training;Cognitive remediation/compensation;Community reintegration;Discharge planning;Disease mangement/prevention;DME/adaptive equipment instruction;Functional mobility training;Neuromuscular re-education;Patient/family education;Self Care/advanced ADL retraining;Therapeutic Activities;Therapeutic Exercise;UE/LE Strength taining/ROM;Visual/perceptual remediation/compensation;UE/LE Coordination activities OT Basic Self-Care Anticipated Outcome(s): Supervision OT Toileting Anticipated Outcome(s): Supervision OT Bathroom Transfers Anticipated Outcome(s): Supervision OT Recommendation Patient destination: Home Follow Up Recommendations: Home health OT Equipment Recommended: To be determined   Skilled Therapeutic Intervention Initial eval completed with treatment provided to address functional transfers, improved sit<>stand, standing tolerance, and adapted bathing/dressing skills. Pt came to sitting EOB with mod A. Addressed sitting balance at EOB with pt needing mostly min A with intermittent Mod A for dynamic sitting balance while washing body parts. Stand-pivot to wc with max A lift and lower. Pt fatigues quickly and needed multiple rest breaks within BADL tasks. Sit<>stand at the sink with Max A  lift and lower while OT assisted with washing buttocks and donning brief. Pt needed max A to don pants today 2/2 diffiuclty leaning forward to thread over LEs. Pt also needed assist to thread shirt overhead and over trunk today. Pt agreeable to sit up in wc to eat breakfast and left with safety belt on and needs met.   OT Evaluation Precautions/Restrictions  Precautions Precautions: Fall Restrictions Weight Bearing Restrictions: No General   Vital Signs   Pain Pain Assessment Pain Assessment: No/denies pain Pain Score: 0-No pain Home Living/Prior Functioning Home Living Family/patient expects to be discharged to:: Private residence Living Arrangements: Spouse/significant other, Children Available Help at Discharge: Family, Available 24 hours/day Type of Home: House Home Access: Stairs to enter [employer] of Steps: 2 Entrance Stairs-Rails: Right Home Layout: Multi-level Alternate Level Stairs-Number of Steps: 8 Alternate Level Stairs-Rails: Left Bathroom Shower/Tub: Tub/shower unit, Multimedia programmer: Handicapped height Bathroom Accessibility: Yes  Lives With: Spouse, Son Prior Function Level of Independence: Independent with basic ADLs ADL ADL ADL Comments: Please see functional navigator Vision Baseline Vision/History: Wears glasses Wears Glasses: At all times Patient Visual Report: No change from baseline Vision Assessment?: No apparent visual deficits Additional Comments: Pt denies any changes from baseline Perception  Perception: Within Functional Limits Praxis Praxis: Intact Cognition Overall Cognitive Status: History of cognitive impairments - at baseline(per chart review) Arousal/Alertness: Lethargic Orientation Level: Place;Person;Situation Person: Oriented Place: Oriented Situation: Oriented Year: 2019 Month: February Day of Week: Correct Memory: Impaired Memory Impairment: Decreased recall of new information Immediate  Memory Recall: Sock;Blue;Bed Memory Recall: Sock Memory Recall Sock: With Cue Sensation Sensation Light Touch: Appears Intact Stereognosis: Appears Intact Hot/Cold: Appears Intact Proprioception: Appears Intact Coordination Gross Motor Movements are Fluid and Coordinated: No Fine Motor Movements are Fluid and Coordinated: No Coordination and Movement Description: Slightly slower coordination Motor  Motor Motor - Skilled Clinical Observations: Slight motor planning impsirment Mobility  Bed Mobility Bed Mobility: Supine to Sit Supine to Sit: 3: Mod assist  Trunk/Postural Assessment  Balance Balance Balance Assessed: Yes Static Sitting Balance Static Sitting - Balance Support: Feet supported Static Sitting - Level of Assistance: 4: Min assist Dynamic Sitting Balance Dynamic Sitting - Balance Support: Feet supported Dynamic Sitting - Level of Assistance: 3: Mod assist Static Standing Balance Static Standing - Balance Support: During functional activity Static Standing - Level of Assistance: 3: Mod assist Dynamic Standing Balance Dynamic Standing - Balance Support: During functional activity Dynamic Standing - Level of Assistance: 3: Mod assist Extremity/Trunk Assessment RUE Assessment RUE Assessment: Within Functional Limits(4-/5) LUE Assessment LUE Assessment: Within Functional Limits(4-/5)   See Function Navigator for Current Functional Status.   Refer to Care Plan for Long Term Goals  Recommendations for other services: Neuropsych   Discharge Criteria: Patient will be discharged from OT if patient refuses treatment 3 consecutive times without medical reason, if treatment goals not met, if there is a change in medical status, if patient makes no progress towards goals or if patient is discharged from hospital.  The above assessment, treatment plan, treatment alternatives and goals were discussed and mutually agreed upon: by patient  Valma Cava 03/14/2017,  12:24 PM

## 2017-03-14 NOTE — Progress Notes (Addendum)
[facility] PHYSICAL MEDICINE & REHABILITATION     PROGRESS NOTE  Subjective/Complaints:  Pt seen working with therapies.  He states he slept well overnight.  He states he is tired from therapies  ROS: Denies CP, SOB, N/V/D.  Objective: Vital Signs: Blood pressure 130/60, pulse 80, temperature 98.2 F (36.8 C), resp. rate 18, weight 75.1 kg (165 lb 9.1 oz), SpO2 98 %. No results found. Recent Labs    03/14/17 0621  WBC 10.1  HGB 12.0*  HCT 37.0*  PLT 229   Recent Labs    03/14/17 0621  NA 135  K 4.1  CL 99*  GLUCOSE 159*  BUN 17  CREATININE 0.84  CALCIUM 9.6   CBG (last 3)  Recent Labs    03/13/17 1752 03/14/17 0654 03/14/17 1139  GLUCAP 145* 150* 193*    Wt Readings from Last 3 Encounters:  03/14/17 75.1 kg (165 lb 9.1 oz)  03/09/17 70.3 kg (155 lb)  11/16/16 70.3 kg (155 lb)    Physical Exam:  BP 130/60 (BP Location: Left Arm)   Pulse 80   Temp 98.2 F (36.8 C)   Resp 18   Wt 75.1 kg (165 lb 9.1 oz)   SpO2 98%   BMI 21.84 kg/m  Constitutional: He appears well-developed and well-nourished.  HENT: Bilateral burr hole sites are dressed  Eyes: EOM are normal. No discharge.  Cardiovascular: RRR. No JVD. Respiratory: Effort normal and breath sounds normal.  GI: Bowel sounds are normal. Nondistended Musculoskeletal: No edema or tenderness in extremities  Neurological: He is alert.  Makes good eye contact with examiner.  Follows simple commands.  Motor: B/l UE: 4/5  B/l LE: HF 3/5, KE 4-/5, ADF/PF 4+/5  (stable) Skin: See above  Psychiatric: He has a normal mood and affect. His behavior is normal.   Assessment/Plan: 1. Functional deficits secondary to bilateral subdural hematomas with midline shift. Status post burr hole evacuation 03/09/2017 which require 3+ hours per day of interdisciplinary therapy in a comprehensive inpatient rehab setting. Physiatrist is providing close team supervision and 24 hour management of active medical problems listed  below. Physiatrist and rehab team continue to assess barriers to discharge/monitor patient progress toward functional and medical goals.  Function:  Bathing Bathing position   Position: Sitting EOB  Bathing parts Body parts bathed by patient: Right arm, Left arm, Chest, Abdomen, Front perineal area, Right upper leg, Left upper leg Body parts bathed by helper: Left lower leg, Right lower leg, Buttocks, Back  Bathing assist Assist Level: Touching or steadying assistance(Pt > 75%)      Upper Body Dressing/Undressing Upper body dressing   What is the patient wearing?: Pull over shirt/dress     Pull over shirt/dress - Perfomed by patient: Thread/unthread right sleeve, Thread/unthread left sleeve Pull over shirt/dress - Perfomed by helper: Put head through opening, Pull shirt over trunk        Upper body assist Assist Level: Touching or steadying assistance(Pt > 75%)      Lower Body Dressing/Undressing Lower body dressing   What is the patient wearing?: Non-skid slipper socks, Pants       Pants- Performed by helper: Thread/unthread right pants leg, Thread/unthread left pants leg, Pull pants up/down   Non-skid slipper socks- Performed by helper: Don/doff right sock, Don/doff left sock                  Lower body assist Assist for lower body dressing: Touching or steadying assistance (Pt > 75%)  Toileting Toileting Toileting activity did not occur: No continent bowel/bladder event        Toileting assist     Transfers Chair/bed Physiological scientist Comprehension Comprehension assist level: Understands basic 75 - 89% of the time/ requires cueing 10 - 24% of the time  Expression Expression assist level: Expresses basic 75 - 89% of the time/requires cueing 10 - 24% of the time. Needs helper to occlude trach/needs to repeat words.  Social Interaction Social Interaction assist level:  Interacts appropriately 75 - 89% of the time - Needs redirection for appropriate language or to initiate interaction.  Problem Solving Problem solving assist level: Solves basic 75 - 89% of the time/requires cueing 10 - 24% of the time  Memory Memory assist level: Recognizes or recalls 50 - 74% of the time/requires cueing 25 - 49% of the time    Medical Problem List and Plan: 1.  Decreased functional mobility secondary to bilateral subdural hematomas with midline shift. Status post burr hole evacuation 03/09/2017   Begin CIR 2.  DVT Prophylaxis/Anticoagulation: SCDs. Patient is ambulatory 3. Pain Management: Lyrica 50 mg twice a day, Hydrocodone and Robaxin as needed 4. Mood/Alzheimer's. Exelon patch 9.5 mg daily 5. Neuropsych: This patient is not fully capable of making decisions on his own behalf. 6. Skin/Wound Care: Routine skin checks 7. Fluids/Electrolytes/Nutrition: Routine I&O's    BMP within acceptable range on 2/27 8. Seizure prophylaxis. Keppra 500 mg twice a day 9. Diabetes mellitus peripheral neuropathy. Hemoglobin A1c 6.9. Glucophage 500 mg twice a day. Check blood sugars before meals and at bedtime. Diabetic teaching   Monitor with increased mobility 10. Hypertension. Altace 2.5 mg daily   Monitor with increased mobility 11. Hyperlipidemia. Lipitor/Zetia 12. Constipation. Laxative assistance 13. Hypoalbuminemia   Supplement initiated on 2/27 14. ABLA   Hb 12.0 on 2/27   Cont to monitor  LOS (Days) 1 A FACE TO FACE EVALUATION WAS PERFORMED  Zailah Zagami Lorie Phenix 03/14/2017 1:36 PM

## 2017-03-15 ENCOUNTER — Inpatient Hospital Stay (HOSPITAL_COMMUNITY): Payer: PPO | Admitting: Occupational Therapy

## 2017-03-15 ENCOUNTER — Inpatient Hospital Stay (HOSPITAL_COMMUNITY): Payer: PPO

## 2017-03-15 ENCOUNTER — Inpatient Hospital Stay (HOSPITAL_COMMUNITY): Payer: PPO | Admitting: Speech Pathology

## 2017-03-15 ENCOUNTER — Inpatient Hospital Stay (HOSPITAL_COMMUNITY): Payer: PPO | Admitting: Physical Therapy

## 2017-03-15 DIAGNOSIS — Z298 Encounter for other specified prophylactic measures: Secondary | ICD-10-CM

## 2017-03-15 LAB — GLUCOSE, CAPILLARY
GLUCOSE-CAPILLARY: 138 mg/dL — AB (ref 65–99)
GLUCOSE-CAPILLARY: 190 mg/dL — AB (ref 65–99)
GLUCOSE-CAPILLARY: 199 mg/dL — AB (ref 65–99)
Glucose-Capillary: 161 mg/dL — ABNORMAL HIGH (ref 65–99)

## 2017-03-15 MED ORDER — METFORMIN HCL 850 MG PO TABS
850.0000 mg | ORAL_TABLET | Freq: Two times a day (BID) | ORAL | Status: DC
  Administered 2017-03-15 – 2017-03-19 (×8): 850 mg via ORAL
  Filled 2017-03-15 (×8): qty 1

## 2017-03-15 NOTE — Progress Notes (Signed)
Initial Nutrition Assessment   DOCUMENTATION CODES:   Not applicable  INTERVENTION:   RD to continue Glucerna TID; each supplement provides 220kcal and 10g protein.  RD to continue with Pro Stat BID; each supplement provides 15g protein and 100kcal.  RD to continue MVI daily.   NUTRITION DIAGNOSIS:   Increased nutrient needs related to chronic illness, wound healing(Alzheimer's, surgical incisions, Stage 2 pressure injury) as evidenced by estimated needs.  GOAL:   Patient will meet greater than or equal to 90% of their needs  MONITOR:   PO intake, Supplement acceptance, Skin, Weight trends  REASON FOR ASSESSMENT:   Malnutrition Screening Tool    ASSESSMENT:   82 y.o male with PMH significant for Alzheimer's dementia, DM, HTN, diabetic neuropathy, and subdural hygroma that has been closely monitored since 11/2016. S/P evacuation of hygroma on 2/22.  Pt reports having a "so-so" appetite and eating roughly 50% of meals. Per chart review, pt is eating anywhere from 50-100% of meals. Pt feels he has had a poor appetite since admission and states he enjoys the glucerna supplements. Further diet hx not able to be obtained due to pt's alzheimer's disease.   Pt denies any N/V/D or constipation.   Pt remembers a UBW of 175lb but was unable to specify time frame. Per chart review, pt was 173lb on 09/02/16. Pt has lost 8lb (4.5%) over 6 months which is not significant for time frame.   Medications: Oscal with D, Vitamin D, insulin, MVI, protonix, senokot.  Labs: CBG (last 3)  Recent Labs    03/14/17 2043 03/15/17 0644 03/15/17 1136  GLUCAP 160* 161* 138*     NUTRITION - FOCUSED PHYSICAL EXAM:    Most Recent Value  Orbital Region  Moderate depletion  Upper Arm Region  Moderate depletion  Thoracic and Lumbar Region  Moderate depletion  Buccal Region  Moderate depletion  Temple Region  Mild depletion  Clavicle Bone Region  Moderate depletion  Clavicle and Acromion Bone  Region  Moderate depletion  Scapular Bone Region  Moderate depletion  Dorsal Hand  Moderate depletion  Patellar Region  Severe depletion  Anterior Thigh Region  Severe depletion  Posterior Calf Region  Severe depletion  Edema (RD Assessment)  Mild  Hair  Reviewed  Eyes  Reviewed  Mouth  Reviewed  Skin  Reviewed  Nails  Reviewed       Diet Order:  Diet regular Room service appropriate? Yes; Fluid consistency: Thin  EDUCATION NEEDS:   No education needs have been identified at this time  Skin:  Skin Assessment: Skin Integrity Issues: Skin Integrity Issues:: Stage II, Incisions Stage II: buttocks Incisions: x2  Last BM:  2/25 type 5 large  Height:   Ht Readings from Last 1 Encounters:  03/09/17 6\' 1"  (1.854 m)    Weight:   Wt Readings from Last 15 Encounters:  03/14/17 165 lb 9.1 oz (75.1 kg)  03/09/17 155 lb (70.3 kg)  11/16/16 155 lb (70.3 kg)  09/12/16 173 lb 9.6 oz (78.7 kg)  09/06/16 173 lb 9.6 oz (78.7 kg)  06/08/16 170 lb 8 oz (77.3 kg)  03/09/16 173 lb 8 oz (78.7 kg)  05/05/14 169 lb (76.7 kg)  04/07/14 170 lb (77.1 kg)  03/06/14 172 lb (78 kg)    Ideal Body Weight:  83.6 kg  BMI:  Body mass index is 21.84 kg/m.  Estimated Nutritional Needs:   Kcal:  2100-2300  Protein:  105-115g  Fluid:  >2.1L    Daniel Brege,  MS, Dietetic Intern Pager # [phone]

## 2017-03-15 NOTE — Progress Notes (Signed)
[place] PHYSICAL MEDICINE & REHABILITATION     PROGRESS NOTE  Subjective/Complaints:  Patient seen lying in bed this morning. He states he slept well overnight. He asks me to make him better.  ROS: Denies CP, SOB, N/V/D.  Objective: Vital Signs: Blood pressure 129/64, pulse 74, temperature 98.2 F (36.8 C), temperature source Oral, resp. rate 20, weight 75.1 kg (165 lb 9.1 oz), SpO2 97 %. No results found. Recent Labs    03/14/17 0621  WBC 10.1  HGB 12.0*  HCT 37.0*  PLT 229   Recent Labs    03/14/17 0621  NA 135  K 4.1  CL 99*  GLUCOSE 159*  BUN 17  CREATININE 0.84  CALCIUM 9.6   CBG (last 3)  Recent Labs    03/14/17 1619 03/14/17 2043 03/15/17 0644  GLUCAP 169* 160* 161*    Wt Readings from Last 3 Encounters:  03/14/17 75.1 kg (165 lb 9.1 oz)  03/09/17 70.3 kg (155 lb)  11/16/16 70.3 kg (155 lb)    Physical Exam:  BP 129/64 (BP Location: Right Arm)   Pulse 74   Temp 98.2 F (36.8 C) (Oral)   Resp 20   Wt 75.1 kg (165 lb 9.1 oz)   SpO2 97%   BMI 21.84 kg/m  Constitutional: He appears well-developed and well-nourished.  HENT: Bilateral burr hole sites are dressed  Eyes: EOM are normal. No discharge.  Cardiovascular: RRR. No JVD. Respiratory: Effort normal and breath sounds normal.  GI: Bowel sounds are normal. Nondistended Musculoskeletal: No edema or tenderness in extremities  Neurological: He is alert.  Makes good eye contact with examiner.  Follows simple commands.  Motor: B/l UE: 4/5  B/l LE: HF 4/5, KE 4/5, ADF/PF 4+/5  Skin: See above  Psychiatric: He has a normal mood and affect. His behavior is normal.   Assessment/Plan: 1. Functional deficits secondary to bilateral subdural hematomas with midline shift. Status post burr hole evacuation 03/09/2017 which require 3+ hours per day of interdisciplinary therapy in a comprehensive inpatient rehab setting. Physiatrist is providing close team supervision and 24 hour management of active  medical problems listed below. Physiatrist and rehab team continue to assess barriers to discharge/monitor patient progress toward functional and medical goals.  Function:  Bathing Bathing position   Position: Sitting EOB  Bathing parts Body parts bathed by patient: Right arm, Left arm, Chest, Abdomen, Front perineal area, Right upper leg, Left upper leg Body parts bathed by helper: Left lower leg, Right lower leg, Buttocks, Back  Bathing assist Assist Level: Touching or steadying assistance(Pt > 75%)      Upper Body Dressing/Undressing Upper body dressing   What is the patient wearing?: Pull over shirt/dress     Pull over shirt/dress - Perfomed by patient: Thread/unthread right sleeve, Thread/unthread left sleeve Pull over shirt/dress - Perfomed by helper: Put head through opening, Pull shirt over trunk        Upper body assist Assist Level: Touching or steadying assistance(Pt > 75%)      Lower Body Dressing/Undressing Lower body dressing   What is the patient wearing?: Non-skid slipper socks, Pants       Pants- Performed by helper: Thread/unthread right pants leg, Thread/unthread left pants leg, Pull pants up/down   Non-skid slipper socks- Performed by helper: Don/doff right sock, Don/doff left sock                  Lower body assist Assist for lower body dressing: Touching or steadying assistance (Pt >  75%)      Toileting Toileting   Toileting steps completed by patient: Performs perineal hygiene Toileting steps completed by helper: Adjust clothing prior to toileting, Adjust clothing after toileting Toileting Assistive Devices: Grab bar or rail  Toileting assist Assist level: (max assist)   Transfers Chair/bed transfer   Chair/bed transfer method: Stand pivot Chair/bed transfer assist level: Maximal assist (Pt 25 - 49%/lift and lower) Chair/bed transfer assistive device: Bedrails, Armrests     Locomotion Ambulation     Max distance: 30 Assist level:  Touching or steadying assistance (Pt > 75%)   Wheelchair          Cognition Comprehension Comprehension assist level: Understands basic 75 - 89% of the time/ requires cueing 10 - 24% of the time  Expression Expression assist level: Expresses basic 75 - 89% of the time/requires cueing 10 - 24% of the time. Needs helper to occlude trach/needs to repeat words.  Social Interaction Social Interaction assist level: Interacts appropriately 75 - 89% of the time - Needs redirection for appropriate language or to initiate interaction.  Problem Solving Problem solving assist level: Solves basic 75 - 89% of the time/requires cueing 10 - 24% of the time  Memory Memory assist level: Recognizes or recalls 50 - 74% of the time/requires cueing 25 - 49% of the time    Medical Problem List and Plan: 1.  Decreased functional mobility secondary to bilateral subdural hematomas with midline shift. Status post burr hole evacuation 03/09/2017   Continue CIR 2.  DVT Prophylaxis/Anticoagulation: SCDs. Patient is ambulatory 3. Pain Management: Lyrica 50 mg twice a day, Hydrocodone and Robaxin as needed 4. Mood/Alzheimer's. Exelon patch 9.5 mg daily 5. Neuropsych: This patient is not fully capable of making decisions on his own behalf. 6. Skin/Wound Care: Routine skin checks 7. Fluids/Electrolytes/Nutrition: Routine I&O's    BMP within acceptable range on 2/27 8. Seizure prophylaxis. Keppra 500 mg twice a day, will wean next week 9. Diabetes mellitus peripheral neuropathy. Hemoglobin A1c 6.9. Check blood sugars before meals and at bedtime. Diabetic teaching   Glucophage 500 mg twice a day, increased to 850 twice a day on 2/28. 10. Hypertension. Altace 2.5 mg daily   Relatively controlled on 2/28 11. Hyperlipidemia. Lipitor/Zetia 12. Constipation. Laxative assistance 13. Hypoalbuminemia   Supplement initiated on 2/27 14. ABLA   Hb 12.0 on 2/27   Cont to monitor  LOS (Days) 2 A FACE TO FACE EVALUATION WAS  PERFORMED  Ankit Lorie Phenix 03/15/2017 8:21 AM

## 2017-03-15 NOTE — Progress Notes (Signed)
Physical Therapy Session Note  Patient Details  Name: Daniel [name] MRN: [id number] Date of Birth: [birth year]  Today's Date: 03/15/2017 PT Individual Time: 0930-1015 PT Individual Time Calculation (min): 45 min   Short Term Goals: Week 1:  PT Short Term Goal 1 (Week 1): Pt will transition sit<>stand with mod assist PT Short Term Goal 2 (Week 1): Pt will initiate stair negotiation with PT PT Short Term Goal 3 (Week 1): Pt will ambulate 60' with LRAD and min assist  Skilled Therapeutic Interventions/Progress Updates:    Session focused on neuro re-ed for sit <> stands (mod to max assist for power up and cues for hand placement) and functional standing balance and postural control (tendency for posterior bias but with extra time and facilitation able to correct and maintain with min assist), alternating toe taps on 3" steps in preparation for stair negotiation and progressed to completing 12 steps (3" height) with rails for support and min assist with extra time due to low endurance, gait training with RW initiated with overall min assist and verbal and tactile cues for upright posture and positioning of self inside of RW (tendency to keep it too far in front of patient) and cues for increased step length (shuffled gait pattern noted and decreased step length on L > R) x 20' x 2 with turning with extra time for motor planning turns, and overall endurance/activity tolerance. Pt does well with extra time given for processing. overall min physical assist for balance and facilitation of weightshifting during transfers. Will benefit from continued practice with sit <> stands and functional LE strengthening to aid with transitional movements. Returned to bed end of session and positioned in supine with all needs in reach.   Therapy Documentation Precautions:  Precautions Precautions: Fall Restrictions Weight Bearing Restrictions: No   Pain:  reports some intermittent sciatica pain and discomfort  from dressing on head. No intervention needed at this time.    See Function Navigator for Current Functional Status.   Therapy/Group: Individual Therapy  Canary Brim Ivory Broad, PT, DPT  03/15/2017, 10:27 AM

## 2017-03-15 NOTE — Progress Notes (Addendum)
Physical Therapy Note  Patient Details  Name: Daniel [name] MRN: [id number] Date of Birth: [birth year] Today's Date: 03/15/2017  0800-0900, 60 min individual tx Pain: none per pt  PT donned pt's pants in supine, with pt bridging slightly to assist with pulling them up.  Supine> sit with min assist with railings.  Pt sat EOB x 5 minutes.  Toilet transfer with max assist, but unable to void.  neuromuscular re-education in sitting via forced use, multimodal cues for alternating reciprcal movement bil LEs on Kinetron at level 40 x 25 cycles x 2.  Seated on mat, R and L lateral leans for wt shifting, then reciprocal scooting for pelvic dissociation.  W/c propulsion using bil UEs x 50' with min assist for steering.  Sit> stand with mod assist.  Wt shifting in standing with +2 for safety.  Stand pivot to R to sit in w/c, with difficulty extending L knee.  Pt left resting in w/c with quick release belt applied and all needs within reach.  See function navigator for current status.  Harmony Sandell 03/15/2017, 7:44 AM

## 2017-03-15 NOTE — Care Management (Signed)
Inpatient Rehabilitation Center Individual Statement of Services  Patient Name:  Daniel [name]  Date:  03/15/2017  Welcome to the [facility].  Our goal is to provide you with an individualized program based on your diagnosis and situation, designed to meet your specific needs.  With this comprehensive rehabilitation program, you will be expected to participate in at least 3 hours of rehabilitation therapies Monday-Friday, with modified therapy programming on the weekends.  Your rehabilitation program will include the following services:  Physical Therapy (PT), Occupational Therapy (OT), Speech Therapy (ST), 24 hour per day rehabilitation nursing, Therapeutic Recreaction (TR), Neuropsychology, Case Management (Social Worker), Rehabilitation Medicine, Nutrition Services and Pharmacy Services  Weekly team conferences will be held on Wednesdays to discuss your progress.  Your Social Worker will talk with you frequently to get your input and to update you on team discussions.  Team conferences with you and your family in attendance may also be held.  Expected length of stay: 14 days    Overall anticipated outcome: supervision  Depending on your progress and recovery, your program may change. Your Social Worker will coordinate services and will keep you informed of any changes. Your Social Worker's name and contact numbers are listed  below.  The following services may also be recommended but are not provided by the [facility]:    [place] will be made to provide these services after discharge if needed.  Arrangements include referral to agencies that provide these services.  Your insurance has been verified to be:  [employer] Your primary doctor is:  Psychiatrist  Pertinent information will be shared with your doctor and your insurance company.  Social Worker:  [place], [place] or ([phone]   Information discussed with and copy given to patient by: Lennart Pall, 03/15/2017, 1:23 PM

## 2017-03-15 NOTE — Progress Notes (Signed)
Physical Therapy Session Note  Patient Details  Name: Daniel [name] MRN: [id number] Date of Birth: [birth year]  Today's Date: 03/15/2017 PT Individual Time: 1510-1540 PT Individual Time Calculation (min): 30 min   Short Term Goals: Week 1:  PT Short Term Goal 1 (Week 1): Pt will transition sit<>stand with mod assist PT Short Term Goal 2 (Week 1): Pt will initiate stair negotiation with PT PT Short Term Goal 3 (Week 1): Pt will ambulate 58' with LRAD and min assist  Skilled Therapeutic Interventions/Progress Updates:    Pt supine in bed upon therapist arrival. Pt reports feeling significantly fatigued and reports 7/10 headache pain despite already receiving pain medication from nursing. Pt agreeable to participate in therapy session. Supine BLE therex x 10-15 reps AROM, AAROM for SLR. Pt left supine in bed with needs in reach, bed alarm in place at end of session.  Therapy Documentation Precautions:  Precautions Precautions: Fall Restrictions Weight Bearing Restrictions: No  See Function Navigator for Current Functional Status.   Therapy/Group: Individual Therapy  Excell Seltzer, PT, DPT  03/15/2017, 4:19 PM

## 2017-03-15 NOTE — Consult Note (Signed)
[place] Nurse wound consult note Reason for Consult:  Pressure injury Wound type: Stage 2 pressure injury left buttock  Pressure Injury POA: Yes Measurement:1.5cm x 1.0cm x 0.1cm  Wound bed: healing, superficial skin loss, pink Drainage (amount, consistency, odor) scant, no odor, serosanguinous  Periwound: intact  Dressing procedure/placement/frequency: Continue soft silicone foam to protect from further injury, insulate.  Discussed POC with patient and bedside nurse.  Re consult if needed, will not follow at this time. Thanks  Aftan Vint [employer], RN,CWOCN, CNS, [place] [phone])

## 2017-03-15 NOTE — Plan of Care (Signed)
  Progressing Consults [facility] BRAIN INJURY PATIENT EDUCATION Description Description: See Patient Education module for eduction specifics 03/15/2017 1139 - Progressing by Claude Manges, LPN Skin Care Protocol Initiated - if Braden Score 18 or less Description If consults are not indicated, leave blank or document N/A 03/15/2017 1139 - Progressing by Claude Manges, LPN Nutrition Consult-if indicated 03/15/2017 1139 - Progressing by Claude Manges, LPN Diabetes Guidelines if Diabetic/Glucose > 140 Description If diabetic or lab glucose is > 140 mg/dl - Initiate Diabetes/Hyperglycemia Guidelines & Document Interventions  03/15/2017 1139 - Progressing by Claude Manges, LPN RH BLADDER ELIMINATION RH STG MANAGE BLADDER WITH ASSISTANCE Description STG Manage Bladder With Assistance min  03/15/2017 1139 - Progressing by Claude Manges, LPN RH STG MANAGE BLADDER WITH MEDICATION WITH ASSISTANCE Description STG Manage Bladder With Medication With min Assistance.  03/15/2017 1139 - Progressing by Claude Manges, LPN RH STG MANAGE BLADDER WITH EQUIPMENT WITH ASSISTANCE Description STG Manage Bladder With Equipment With min Assistance  03/15/2017 1139 - Progressing by Claude Manges, LPN RH SKIN INTEGRITY RH STG SKIN FREE OF INFECTION/BREAKDOWN Description Skin free of infection/breakdown with min assistance  03/15/2017 1139 - Progressing by Claude Manges, LPN RH STG MAINTAIN SKIN INTEGRITY WITH ASSISTANCE Description STG Maintain Skin Integrity With min Assistance.  03/15/2017 1139 - Progressing by Claude Manges, LPN RH STG ABLE TO PERFORM INCISION/WOUND CARE W/ASSISTANCE Description STG Able To Perform Incision/Wound Care With min Assistance.  03/15/2017 1139 - Progressing by Claude Manges, LPN RH SAFETY RH STG ADHERE TO SAFETY PRECAUTIONS W/ASSISTANCE/DEVICE Description STG Adhere to Safety Precautions With supervision Assistance/Device.  03/15/2017  1139 - Progressing by Claude Manges, LPN RH STG DECREASED RISK OF FALL WITH ASSISTANCE Description STG Decreased Risk of Fall With supervision Assistance.  03/15/2017 1139 - Progressing by Claude Manges, LPN RH COGNITION-NURSING RH STG USES MEMORY AIDS/STRATEGIES W/ASSIST TO PROBLEM SOLVE Description STG Uses Memory Aids/Strategies With mod Assistance to Problem Solve.  03/15/2017 1139 - Progressing by Claude Manges, LPN RH STG ANTICIPATES NEEDS/CALLS FOR ASSIST W/ASSIST/CUES Description STG Anticipates Needs/Calls for Assist With min Assistance/Cues.  03/15/2017 1139 - Progressing by Claude Manges, LPN RH PAIN MANAGEMENT RH STG PAIN MANAGED AT OR BELOW PT'S PAIN GOAL Description Pain < or = 4 with min assistance  03/15/2017 1139 - Progressing by Claude Manges, LPN

## 2017-03-15 NOTE — Progress Notes (Signed)
Occupational Therapy Session Note  Patient Details  Name: Daniel [name] MRN: [id number] Date of Birth: [birth year]  Today's Date: 03/15/2017 OT Individual Time: 1335-1430 OT Individual Time Calculation (min): 55 min    Short Term Goals: Week 1:  OT Short Term Goal 1 (Week 1): Pt will complete Sit<>stand with consistent Mod A in preparation for BADL tasks OT Short Term Goal 2 (Week 1): Pt will complete toilet transfers with consitent Mod A to reduce caregiver burden  OT Short Term Goal 3 (Week 1): Pt will tolerate standing at the sink for 1 minute to increase activity tolerance for BADL tasks  Skilled Therapeutic Interventions/Progress Updates:    Treatment session with focus on functional mobility and sit <> stand.  Pt required max redirection to attend to tasks as pt internally distracted and verbose.  Pt completed bed mobility with supervision.  Engaged in sit <> stand x5 with focus on anterior weight shift and upright standing posture.  Utilized mirror to improve upright standing posture.  Engaged in [place] bathing after incontinent of urine with setup while alternating UE support for standing balance.  Pt stood ~90 seconds before requiring seated rest break.  Ambulated 10 feet x2 to/from bathroom sink with RW and min assist, pt taking small shuffling steps.  Returned to bed and to supine with mod assist to lift legs in to bed.  Pt required frequent rest breaks and redirection back to treatment activities.  Therapy Documentation Precautions:  Precautions Precautions: Fall Restrictions Weight Bearing Restrictions: No General:   Vital Signs: Therapy Vitals Temp: (!) 97.3 F (36.3 C) Temp Source: Oral Pulse Rate: 69 Resp: 18 BP: 122/65 Patient Position (if appropriate): Lying Oxygen Therapy SpO2: 97 % O2 Device: Room Air Pain: Pain Assessment Pain Assessment: 0-10 Pain Score: 6  Pain Type: Surgical pain Pain Location: Head Pain Orientation: Right;Left Pain Descriptors /  Indicators: Aching Patients Stated Pain Goal: 0 Pain Intervention(s): Medication (See eMAR) Multiple Pain Sites: No  See Function Navigator for Current Functional Status.   Therapy/Group: Individual Therapy  [name] 03/15/2017, 3:31 PM

## 2017-03-15 NOTE — Discharge Summary (Signed)
Physician Discharge Summary  Patient ID: Daniel [name] MRN: [id number] DOB/AGE: 1936/01/04 82 y.o.  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 Acute cognitive deterioration  Discharge Diagnoses: Chronic bilateral subdural hygromas, left subdural hematoma, Alzheimer's disease. Acute cognitive deterioration Active Problems:   Subdural hygroma   Malnutrition of moderate degree   Steroid-induced hyperglycemia   Diabetes mellitus type 2 in nonobese (HCC)   Tachypnea   Encephalopathy   Seizure prophylaxis   Hyperlipidemia   Slow transit constipation   Discharged Condition: fair  Hospital Course: Patient was admitted to undergo bilateral bur holes for drainage of subdural fluid collections on the right it appeared that he had a chronic subdural hygroma on the left it appeared that he had a chronic subdural hematoma. In her went burr hole drainage of both of these. Postoperatively he is in recovering slowly but he will need to significant comprehensive inpatient rehabilitation.  Consults: Rehabilitation medicine  Significant Diagnostic Studies: CT scan  Treatments: surgery: Bilateral burr hole drainage of subdural fluid collections.  Discharge Exam: Blood pressure (!) 113/97, pulse 67, temperature 99.4 F (37.4 C), resp. rate 19, height 6\' 1"  (1.854 m), weight 70.3 kg (155 lb), SpO2 98 %. Incisions are clean and dry. Patient's level of alertness is improved although he still has some significant cognitive dysfunction with a lack of spontaneous verbalization and questionable orientation to time place and person.  Disposition: Acute care comprehensive rehabilitation center  Allergies as of 03/13/2017      Reactions   Donepezil Diarrhea   Tape Rash   PAPER TAPE IS PREFERRED, PLEASE      Medication List    ASK your doctor about these medications   Alpha Lipoic  Acid 200 MG Caps Take 200 mg by mouth daily.   atorvastatin 40 MG tablet Commonly known as:  LIPITOR Take 40 mg by mouth daily at 6 (six) AM.   Calcium-Magnesium-Zinc-D3 Tabs Take 1 tablet by mouth daily.   celecoxib 200 MG capsule Commonly known as:  CELEBREX Take 200 mg by mouth daily.   [employer] D3 2000 units Caps Generic drug:  Cholecalciferol Take 2,000 Units by mouth daily.   ezetimibe 10 MG tablet Commonly known as:  ZETIA Take 10 mg by mouth every evening.   LYRICA 50 MG capsule Generic drug:  pregabalin Take 50 mg by mouth 2 (two) times daily.   metFORMIN 500 MG tablet Commonly known as:  GLUCOPHAGE Take 500 mg by mouth 2 (two) times daily.   multivitamin with minerals Tabs tablet Take 1 tablet by mouth daily.   naproxen sodium 220 MG tablet Commonly known as:  ALEVE Take 440 mg by mouth 2 (two) times daily as needed (for lower back pain).   pantoprazole 40 MG tablet Commonly known as:  PROTONIX Take 40 mg by mouth daily before breakfast.   ramipril 2.5 MG capsule Commonly known as:  ALTACE Take 2.5 mg by mouth daily.   rivastigmine 9.5 mg/24hr Commonly known as:  EXELON Place 1 patch (9.5 mg total) onto the skin daily.   sodium chloride 0.65 % Soln nasal spray Commonly known as:  OCEAN Place 1 spray into both nostrils every morning.        SignedEarleen Newport 03/15/2017, 7:51 PM

## 2017-03-16 ENCOUNTER — Inpatient Hospital Stay (HOSPITAL_COMMUNITY): Payer: PPO | Admitting: Physical Therapy

## 2017-03-16 ENCOUNTER — Inpatient Hospital Stay (HOSPITAL_COMMUNITY): Payer: PPO | Admitting: Occupational Therapy

## 2017-03-16 ENCOUNTER — Inpatient Hospital Stay (HOSPITAL_COMMUNITY): Payer: PPO

## 2017-03-16 DIAGNOSIS — R7309 Other abnormal glucose: Secondary | ICD-10-CM

## 2017-03-16 LAB — GLUCOSE, CAPILLARY
GLUCOSE-CAPILLARY: 150 mg/dL — AB (ref 65–99)
GLUCOSE-CAPILLARY: 162 mg/dL — AB (ref 65–99)
GLUCOSE-CAPILLARY: 181 mg/dL — AB (ref 65–99)
Glucose-Capillary: 152 mg/dL — ABNORMAL HIGH (ref 65–99)

## 2017-03-16 NOTE — Progress Notes (Signed)
[facility] PHYSICAL MEDICINE & REHABILITATION     PROGRESS NOTE  Subjective/Complaints:  Pt seen lying in bed this AM.  He slept well overnight.  He again just asks that I make him better.   ROS: Denies CP, SOB, N/V/D.  Objective: Vital Signs: Blood pressure 139/68, pulse 73, temperature 98 F (36.7 C), temperature source Oral, resp. rate 18, weight 75.1 kg (165 lb 9.1 oz), SpO2 99 %. No results found. Recent Labs    03/14/17 0621  WBC 10.1  HGB 12.0*  HCT 37.0*  PLT 229   Recent Labs    03/14/17 0621  NA 135  K 4.1  CL 99*  GLUCOSE 159*  BUN 17  CREATININE 0.84  CALCIUM 9.6   CBG (last 3)  Recent Labs    03/15/17 1618 03/15/17 2054 03/16/17 0645  GLUCAP 190* 199* 150*    Wt Readings from Last 3 Encounters:  03/14/17 75.1 kg (165 lb 9.1 oz)  03/09/17 70.3 kg (155 lb)  11/16/16 70.3 kg (155 lb)    Physical Exam:  BP 139/68 (BP Location: Right Arm)   Pulse 73   Temp 98 F (36.7 C) (Oral)   Resp 18   Wt 75.1 kg (165 lb 9.1 oz)   SpO2 99%   BMI 21.84 kg/m  Constitutional: He appears well-developed and well-nourished.  HENT: Bilateral burr hole sites c/d/i Eyes: EOM are normal. No discharge.  Cardiovascular: RRR. No JVD. Respiratory: Effort normal and breath sounds normal.  GI: Bowel sounds are normal. Nondistended Musculoskeletal: No edema or tenderness in extremities  Neurological: He is alert.  Makes good eye contact with examiner.  Follows simple commands.  Motor: B/l UE: 4/5  B/l LE: HF 4/5, KE 4/5, ADF/PF 4+/5 (stable) Skin: See above  Psychiatric: He has a normal mood and affect. His behavior is normal.   Assessment/Plan: 1. Functional deficits secondary to bilateral subdural hematomas with midline shift. Status post burr hole evacuation 03/09/2017 which require 3+ hours per day of interdisciplinary therapy in a comprehensive inpatient rehab setting. Physiatrist is providing close team supervision and 24 hour management of active medical  problems listed below. Physiatrist and rehab team continue to assess barriers to discharge/monitor patient progress toward functional and medical goals.  Function:  Bathing Bathing position   Position: Sitting EOB  Bathing parts Body parts bathed by patient: Right arm, Left arm, Chest, Abdomen, Front perineal area, Right upper leg, Left upper leg Body parts bathed by helper: Left lower leg, Right lower leg, Buttocks, Back  Bathing assist Assist Level: Touching or steadying assistance(Pt > 75%)      Upper Body Dressing/Undressing Upper body dressing   What is the patient wearing?: Pull over shirt/dress     Pull over shirt/dress - Perfomed by patient: Thread/unthread right sleeve, Thread/unthread left sleeve Pull over shirt/dress - Perfomed by helper: Put head through opening, Pull shirt over trunk        Upper body assist Assist Level: Touching or steadying assistance(Pt > 75%)      Lower Body Dressing/Undressing Lower body dressing   What is the patient wearing?: Non-skid slipper socks, Pants       Pants- Performed by helper: Thread/unthread right pants leg, Thread/unthread left pants leg, Pull pants up/down   Non-skid slipper socks- Performed by helper: Don/doff right sock, Don/doff left sock                  Lower body assist Assist for lower body dressing: Touching or steadying assistance (  Pt > 75%)      Toileting Toileting   Toileting steps completed by patient: Performs perineal hygiene Toileting steps completed by helper: Adjust clothing prior to toileting, Adjust clothing after toileting Toileting Assistive Devices: Grab bar or rail  Toileting assist Assist level: (max assist)   Transfers Chair/bed transfer   Chair/bed transfer method: Squat pivot Chair/bed transfer assist level: Maximal assist (Pt 25 - 49%/lift and lower) Chair/bed transfer assistive device: Armrests     Locomotion Ambulation     Max distance: 108' Assist level: Touching or  steadying assistance (Pt > 75%)   Wheelchair          Cognition Comprehension Comprehension assist level: Understands basic 75 - 89% of the time/ requires cueing 10 - 24% of the time  Expression Expression assist level: Expresses basic 75 - 89% of the time/requires cueing 10 - 24% of the time. Needs helper to occlude trach/needs to repeat words.  Social Interaction Social Interaction assist level: Interacts appropriately 75 - 89% of the time - Needs redirection for appropriate language or to initiate interaction.  Problem Solving Problem solving assist level: Solves basic 25 - 49% of the time - needs direction more than half the time to initiate, plan or complete simple activities  Memory Memory assist level: Recognizes or recalls 50 - 74% of the time/requires cueing 25 - 49% of the time    Medical Problem List and Plan: 1.  Decreased functional mobility secondary to bilateral subdural hematomas with midline shift. Status post burr hole evacuation 03/09/2017   Continue CIR 2.  DVT Prophylaxis/Anticoagulation: SCDs. Patient is ambulatory 3. Pain Management: Lyrica 50 mg twice a day, Hydrocodone and Robaxin as needed 4. Mood/Alzheimer's. Exelon patch 9.5 mg daily 5. Neuropsych: This patient is not fully capable of making decisions on his own behalf. 6. Skin/Wound Care: Routine skin checks 7. Fluids/Electrolytes/Nutrition: Routine I&O's    BMP within acceptable range on 2/27   Labs ordered for Monday 8. Seizure prophylaxis. Keppra 500 mg twice a day, will wean next week 9. Diabetes mellitus peripheral neuropathy. Hemoglobin A1c 6.9. Check blood sugars before meals and at bedtime. Diabetic teaching   Glucophage 500 mg twice a day, increased to 850 twice a day on 2/28.   Labile on 3/1 10. Hypertension. Altace 2.5 mg daily   Relatively controlled on 3/1 11. Hyperlipidemia. Lipitor/Zetia 12. Constipation. Laxative assistance 13. Hypoalbuminemia   Supplement initiated on 2/27 14. ABLA    Hb 12.0 on 2/27   Labs ordered for Monday   Cont to monitor  LOS (Days) 3 A FACE TO FACE EVALUATION WAS PERFORMED  Khyler Urda Lorie Phenix 03/16/2017 8:15 AM

## 2017-03-16 NOTE — Progress Notes (Signed)
Social Work  Social Work Assessment and Plan  Patient Details  Name: Daniel [name] MRN: [id number] Date of Birth: [birth year]  Today's Date: 03/16/2017  Problem List:  Patient Active Problem List   Diagnosis Date Noted  . Urinary incontinence   . Labile blood glucose   . Type 2 diabetes mellitus with peripheral neuropathy (HCC)   . Subdural hematoma ([place]) 03/13/2017  . Encephalopathy   . Seizure prophylaxis   . Hyperlipidemia   . Slow transit constipation   . Steroid-induced hyperglycemia   . Diabetes mellitus type 2 in nonobese (HCC)   . Tachypnea   . Malnutrition of moderate degree 03/08/2017  . 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 Urinary frequency 11/16/2016  . Postprocedural hypotension   . Dural tear   . Alzheimer's dementia without behavioral disturbance   . Chronic back pain   . Post-operative pain   . Benign essential HTN   . Leukocytosis   . Acute blood loss anemia   . Thrombocytopenia ([place])   . Spondylolisthesis of lumbar region 09/12/2016  . Essential hypertension 09/12/2016  . Surgery, elective   . Diarrhea 06/08/2016  . Memory loss 03/09/2016  . Essential tremor 03/09/2016  . Diabetic neuropathy ([place]) 03/09/2016  . Vitamin D deficiency 03/09/2016   Past Medical History:  Past Medical History:  Diagnosis Date  . Alzheimer disease   . Arthritis   . Cancer (HCC)    Skin  . Diabetes mellitus without complication ([place])   . GERD (gastroesophageal reflux disease)   . Hyperlipidemia   . Hypertension    Past Surgical History:  Past Surgical History:  Procedure Laterality Date  . BACK SURGERY    . BURR HOLE Bilateral 03/09/2017   Procedure: BURR HOLES FOR SUBDURAL HEMATOMA BILATERAL;  Surgeon: Consuella Lose, MD;  Location: [facility];  Service: Neurosurgery;  Laterality: Bilateral;  . CATARACT EXTRACTION Bilateral 1987 89  . ROTATOR CUFF REPAIR Left 2003   Social History:  reports that he quit smoking about 39 years ago. he has never used smokeless tobacco.  He reports that he drinks alcohol. He reports that he does not use drugs.  Family / Support Systems Marital Status: Married Patient Roles: Spouse, Parent Spouse/Significant Other: wife, Calel Pisarski @ (H) [phone] or [phone] Children: adult son, [name], living in the home (disabled) Anticipated Caregiver: wife and son, [name] Ability/Limitations of Caregiver: Wife reports she can provide light assistance and that son can also assist. Caregiver Availability: 24/7 Family Dynamics: Pt describes his family as supportive, however, wife expressing concern about whether pt can reach mobility goals.  She notes she "can be there but he has to be able to do the steps."  Social History Preferred language: English Religion: Methodist Cultural Background: NA Read: Yes Write: Yes Employment Status: Retired Freight forwarder Issues: None Guardian/Conservator: None - per MD, pt is not fully capable of making decisions on his own behalf - defer to spouse   Abuse/Neglect Abuse/Neglect Assessment Can Be Completed: Yes Physical Abuse: Denies Verbal Abuse: Denies Sexual Abuse: Denies Exploitation of patient/patient's resources: Denies Self-Neglect: Denies  Emotional Status Pt's affect, behavior adn adjustment status: Elderly gentleman, appears frail and lying in bed as he completes the assessment interview without much difficulty.  He is able to provide basic, personal information yet also asked that he follow up with his wife.  Patient states he has no recall of coming into the hospital.  He denies any significant emotional, however, may benefit from referral for neuropsychology for additional  support cognitive eval. Recent Psychosocial Issues: Patient self-report of having Alzheimer's, yet, describes himself as "fairly independent" PTA Pyschiatric History: None Substance Abuse History: None  Patient / Family Perceptions, Expectations & Goals Pt/Family understanding of illness  & functional limitations: Patient and wife had good, understanding of the bilateral SDH and need for burr hole evacuation.  Good understanding of current functional limitations/need for CIR Premorbid pt/family roles/activities: Patient was relatively independent, however, has had a decline in function since back  surgery in August 2018. Anticipated changes in roles/activities/participation: Little change anticipated if pt is able to reach a supervision level of function.  Wife has been providing caregiver support. Pt/family expectations/goals: "I need to be stronger."  Per wife, "He needs to be able to get up and down 6-8 steps to get to the bathroom."  [employer]: None Premorbid Home Care/DME Agencies: Other (Comment)([facility] after back surgery) Transportation available at discharge: yes Resource referrals recommended: Neuropsychology  Discharge Planning Living Arrangements: Spouse/significant other, Children Support Systems: Spouse/significant other, Children Type of Residence: Private residence Insurance Resources: Multimedia programmer (specify)([employer]) Financial Resources: Social Security Financial Screen Referred: No Living Expenses: Own Money Management: Spouse Does the patient have any problems obtaining your medications?: No Home Management: mostly wife and son Patient/Family Preliminary Plans: Pt to return home with family and wife to resume caregiver support. Sw Barriers to Discharge: Home environment access/layout Sw Barriers to Discharge Comments: Stairs could be a barrier if only limited gains made with mobility. Social Work Anticipated Follow Up Needs: HH/OP Expected length of stay: 14 days  Clinical Impression Elderly, frail appearing gentleman here following burr-hole surgery for bil SDH.  Wife and adult son at home, however, wife notes her hope if that he will reach a supervision level and be able to negotiate 6-8 stairs.   Pt denies any significant emotional distress, however, may benefit from neuropsychology consult.  SW to follow for support and d/c planning needs.  Holy Battenfield 03/16/2017, 11:54 AM

## 2017-03-16 NOTE — Progress Notes (Signed)
Social Work Patient ID: Daniel [name], male   DOB: 03-14-1935, 82 y.o.   MRN: [id number]   Have reviewed team conference with pt and wife.  Both aware target ELS of 10-14 days and hopeful for supervision goals overall.  Wife very concerned that team understand that "he needs to be able to go up and down 6-8 steps to get to the bathroom..."  - will alert tx.    Shaasia Odle, LCSW

## 2017-03-16 NOTE — Plan of Care (Signed)
  Progressing Consults [facility] BRAIN INJURY PATIENT EDUCATION Description Description: See Patient Education module for eduction specifics 03/16/2017 1727 - Progressing by Brita Romp, RN Skin Care Protocol Initiated - if Braden Score 18 or less Description If consults are not indicated, leave blank or document N/A 03/16/2017 1727 - Progressing by Brita Romp, RN Nutrition Consult-if indicated 03/16/2017 1727 - Progressing by Brita Romp, RN Diabetes Guidelines if Diabetic/Glucose > 140 Description If diabetic or lab glucose is > 140 mg/dl - Initiate Diabetes/Hyperglycemia Guidelines & Document Interventions  03/16/2017 1727 - Progressing by Brita Romp, RN RH BLADDER ELIMINATION RH STG MANAGE BLADDER WITH ASSISTANCE Description STG Manage Bladder With Assistance min  03/16/2017 1727 - Progressing by Brita Romp, RN RH STG MANAGE BLADDER WITH MEDICATION WITH ASSISTANCE Description STG Manage Bladder With Medication With min Assistance.  03/16/2017 1727 - Progressing by Brita Romp, RN RH STG MANAGE BLADDER WITH EQUIPMENT WITH ASSISTANCE Description STG Manage Bladder With Equipment With min Assistance  03/16/2017 1727 - Progressing by Brita Romp, RN RH SKIN INTEGRITY RH STG SKIN FREE OF INFECTION/BREAKDOWN Description Skin free of infection/breakdown with min assistance  03/16/2017 1727 - Progressing by Brita Romp, RN RH STG MAINTAIN SKIN INTEGRITY WITH ASSISTANCE Description STG Maintain Skin Integrity With min Assistance.  03/16/2017 1727 - Progressing by Brita Romp, RN RH STG ABLE TO PERFORM INCISION/WOUND CARE W/ASSISTANCE Description STG Able To Perform Incision/Wound Care With min Assistance.  03/16/2017 1727 - Progressing by Brita Romp, RN RH SAFETY RH STG ADHERE TO SAFETY PRECAUTIONS W/ASSISTANCE/DEVICE Description STG Adhere to Safety Precautions With supervision Assistance/Device.  03/16/2017 1727 - Progressing by  Brita Romp, RN RH STG DECREASED RISK OF FALL WITH ASSISTANCE Description STG Decreased Risk of Fall With supervision Assistance.  03/16/2017 1727 - Progressing by Brita Romp, RN RH COGNITION-NURSING RH STG USES MEMORY AIDS/STRATEGIES W/ASSIST TO PROBLEM SOLVE Description STG Uses Memory Aids/Strategies With mod Assistance to Problem Solve.  03/16/2017 1727 - Progressing by Brita Romp, RN RH STG ANTICIPATES NEEDS/CALLS FOR ASSIST W/ASSIST/CUES Description STG Anticipates Needs/Calls for Assist With min Assistance/Cues.  03/16/2017 1727 - Progressing by Brita Romp, RN RH PAIN MANAGEMENT RH STG PAIN MANAGED AT OR BELOW PT'S PAIN GOAL Description Pain < or = 4 with min assistance  03/16/2017 1727 - Progressing by Brita Romp, RN

## 2017-03-16 NOTE — Progress Notes (Signed)
Occupational Therapy Note  Patient Details  Name: Daniel [name] MRN: [id number] Date of Birth: Apr 15, 1935  Today's Date: 03/16/2017 OT Missed Time: 80 Minutes Missed Time Reason: Patient fatigue  Pt missed 30 mins scheduled treatment session secondary to fatigue.  Pt asleep upon arrival, spoke to therapist but maintained eyes closed and requested to continue to rest at this time.  Encouraged pt to participate in treatment session, however pt stating too tired.  Will continue to follow as able.   [name] 03/16/2017, 4:10 PM

## 2017-03-16 NOTE — Progress Notes (Signed)
Physical Therapy Note  Patient Details  Name: Daniel [name] MRN: [id number] Date of Birth: 06-02-1935 Today's Date: 03/16/2017    Time: 830-927 57 minutes  1:1 No c/o pain.  Min A for supine to sit.  Sit <> stand blocked practice with pt progressing from mod - max A. Gait with RW 50' x 3 with pt with slow, shuffling steps, cues for posture and fluidity during gait.  Stair negotiation 3'' steps 8 x 2 with bilat handrails.  pt left in recliner with quick release belt donned, needs at hand.   [name] 03/16/2017, 9:30 AM

## 2017-03-16 NOTE — Progress Notes (Signed)
Occupational Therapy Session Note  Patient Details  Name: Daniel [name] MRN: [id number] Date of Birth: November 20, 1935  Today's Date: 03/16/2017 OT Individual Time: 5638-7564 OT Individual Time Calculation (min): 43 min    Short Term Goals: Week 1:  OT Short Term Goal 1 (Week 1): Pt will complete Sit<>stand with consistent Mod A in preparation for BADL tasks OT Short Term Goal 2 (Week 1): Pt will complete toilet transfers with consitent Mod A to reduce caregiver burden  OT Short Term Goal 3 (Week 1): Pt will tolerate standing at the sink for 1 minute to increase activity tolerance for BADL tasks  Skilled Therapeutic Interventions/Progress Updates:    1:1. Pt asleep upon arrival with increased time to arouse.pt without c/o pain hwever reporting fatigue. Pt stand pivot transfer using bed rail/grab bar in bathroom to transfer EOB<>w/c<>BSC with lifting A and Vc for safety. Pt able to complete clothing management with RUE. Pt voids bladder on toilet with increased time. Pt stands at sink to complete shaving and wash hands with MOD A for lifting and min guard for standing balance. Pt requires VC for weight shifting to R with tactile cues standing for >5 min at sink for improved activity tolerance. Exited session with pt seated bed with call light in reach and all needs met.  Therapy Documentation Precautions:  Precautions Precautions: Fall Restrictions Weight Bearing Restrictions: No  See Function Navigator for Current Functional Status.   Therapy/Group: Individual Therapy  [name] 03/16/2017, 2:12 PM

## 2017-03-16 NOTE — Patient Care Conference (Signed)
Inpatient RehabilitationTeam Conference and Plan of Care Update Date: 03/14/2017   Time: 11:50 AM    Patient Name: Daniel [name]      Medical Record Number: [id number]  Date of Birth: [birth year] Sex: Male         Room/Bed: 4M03C/4M03C-01 Payor Info: Payor: [employer] / Plan: [employer] / Product Type: *No Product type* /    Admitting Diagnosis: SDH  Admit Date/Time:  03/13/2017  8:09 PM Admission Comments: No comment available   Primary Diagnosis:  <principal problem not specified> Principal Problem: <principal problem not specified>  Patient Active Problem List   Diagnosis Date Noted  . Labile blood glucose   . Type 2 diabetes mellitus with peripheral neuropathy (HCC)   . Subdural hematoma ([place]) 03/13/2017  . Encephalopathy   . Seizure prophylaxis   . Hyperlipidemia   . Slow transit constipation   . Steroid-induced hyperglycemia   . Diabetes mellitus type 2 in nonobese (HCC)   . Tachypnea   . Malnutrition of moderate degree 03/08/2017  . 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 Urinary frequency 11/16/2016  . Postprocedural hypotension   . Dural tear   . Alzheimer's dementia without behavioral disturbance   . Chronic back pain   . Post-operative pain   . Benign essential HTN   . Leukocytosis   . Acute blood loss anemia   . Thrombocytopenia ([place])   . Spondylolisthesis of lumbar region 09/12/2016  . Essential hypertension 09/12/2016  . Surgery, elective   . Diarrhea 06/08/2016  . Memory loss 03/09/2016  . Essential tremor 03/09/2016  . Diabetic neuropathy ([place]) 03/09/2016  . Vitamin D deficiency 03/09/2016    Expected Discharge Date: Expected Discharge Date: (ELS 10-14 days)  Team Members Present: Physician leading conference: Dr. Delice Lesch Nurse Present: Frances Maywood, RN PT Present: Roderic Ovens, PT OT Present: [name], OT SLP Present: Windell Moulding, SLP PPS Coordinator present : Daiva Nakayama, RN, CRRN     Current Status/Progress Goal  Weekly Team Focus  Medical   Decreased functional mobility secondary to bilateral subdural hematomas with midline shift. Status post burr hole evacuation 03/09/2017  Improve mobility, cognition, DM/HTN meds  See above   Bowel/Bladder   Patient continent of bowel; is able to tell when he needs to urinate, but has urgency and difficulty independently managing clothing therefore has frequent accidents; voids large amounts at a time  Min assist  Assess and treat for constipation as needed; encourage regular pattern for emptying bladder (timed toileting)   Swallow/Nutrition/ Hydration   pending ST eval         ADL's   Mod/Max A overall  Supervision overall  Dynamic standing, sit<>stand, activity tolerance,  transfers, modified bathing/dressing,   Mobility   eval pending         Communication   pending ST eval         Safety/Cognition/ Behavioral Observations  pending ST eval         Pain   C/o pain in head and in right side.  On lyrica.  does not take PRNs on regular basis  < 3  Assess and treat for pain q shift and prn   Skin   Stage II to left buttocks, incisions to bilateral head covered with telfa (stapled in place)  No further skin breakdown or infection with mod assist.  Assess skin q shift and prn    Rehab Goals Patient on target to meet rehab goals: Yes *See Care Plan and progress notes for long  and short-term goals.     Barriers to Discharge  Current Status/Progress Possible Resolutions Date Resolved   Physician    Medical stability;Other (comments)  Cognition  See above  Therapies, optimize DM/HTN meds, alzheimer's meds      Nursing  Behavior               PT                    OT                  SLP                SW                Discharge Planning/Teaching Needs:  Pt will d/c home with spouse and son who can provide supervision/ light min assist.  Teaching to be planned prior to d/c.   Team Discussion:  Pt with noted h/o alzheimers;  BP and DM issues.   evals all pending.  Anticipate 10-13 d ELS  Revisions to Treatment Plan:  None    Continued Need for Acute Rehabilitation Level of Care: The patient requires daily medical management by a physician with specialized training in physical medicine and rehabilitation for the following conditions: Daily direction of a multidisciplinary physical rehabilitation program to ensure safe treatment while eliciting the highest outcome that is of practical value to the patient.: Yes Daily medical management of patient stability for increased activity during participation in an intensive rehabilitation regime.: Yes Daily analysis of laboratory values and/or radiology reports with any subsequent need for medication adjustment of medical intervention for : Neurological problems;Mood/behavior problems;Blood pressure problems;Diabetes problems  Nyima Vanacker 03/16/2017, 11:43 AM

## 2017-03-16 NOTE — Progress Notes (Signed)
Occupational Therapy Session Note  Patient Details  Name: Daniel [name] MRN: [id number] Date of Birth: [birth year]  Today's Date: 03/16/2017 OT Individual Time: 1100-1200 OT Individual Time Calculation (min): 60 min    Short Term Goals: Week 1:  OT Short Term Goal 1 (Week 1): Pt will complete Sit<>stand with consistent Mod A in preparation for BADL tasks OT Short Term Goal 2 (Week 1): Pt will complete toilet transfers with consitent Mod A to reduce caregiver burden  OT Short Term Goal 3 (Week 1): Pt will tolerate standing at the sink for 1 minute to increase activity tolerance for BADL tasks  Skilled Therapeutic Interventions/Progress Updates:    Treatment session with focus on sit <> stand, standing tolerance, and cognitive challenge.  Pt received asleep in recliner, easily aroused to voice.  Pt declined bathing and stated he had put on clean clothes earlier, however did want to wash his buttocks.  Max assist sit > stand from low surface recliner.  Therapist assisted with hygiene while pt maintained standing ~2 mins with UE support on RW.  Engaged in table top activity to increase problem solving, awareness, and sequencing while completing novel task incorporating numbers (pt had been a Programme researcher, broadcasting/film/video).  Pt demonstrating decreased mental flexibility and occasional difficulty with recall, however demonstrated excellent mental math.  Therapist assisted with opening containers on meal tray and left pt upright in recliner with quick release belt fastened to eat lunch.  Therapy Documentation Precautions:  Precautions Precautions: Fall Restrictions Weight Bearing Restrictions: No Pain: Pain Assessment Pain Assessment: No/denies pain Pain Score: 0-No pain  See Function Navigator for Current Functional Status.   Therapy/Group: Individual Therapy  [name] 03/16/2017, 12:43 PM

## 2017-03-17 ENCOUNTER — Inpatient Hospital Stay (HOSPITAL_COMMUNITY): Payer: PPO

## 2017-03-17 ENCOUNTER — Inpatient Hospital Stay (HOSPITAL_COMMUNITY): Payer: PPO | Admitting: Physical Therapy

## 2017-03-17 LAB — GLUCOSE, CAPILLARY
GLUCOSE-CAPILLARY: 161 mg/dL — AB (ref 65–99)
Glucose-Capillary: 142 mg/dL — ABNORMAL HIGH (ref 65–99)
Glucose-Capillary: 137 mg/dL — ABNORMAL HIGH (ref 65–99)
Glucose-Capillary: 148 mg/dL — ABNORMAL HIGH (ref 65–99)

## 2017-03-17 NOTE — Progress Notes (Signed)
Physical Therapy Session Note  Patient Details  Name: Daniel [name] MRN: [id number] Date of Birth: July 01, 1935  Today's Date: 03/17/2017 PT Individual Time: 8159-4707 PT Individual Time Calculation (min): 27 min  and Today's Date: 03/17/2017 PT Missed Time: 18 Minutes Missed Time Reason: Patient unwilling to participate  Short Term Goals: Week 1:  PT Short Term Goal 1 (Week 1): Pt will transition sit<>stand with mod assist PT Short Term Goal 2 (Week 1): Pt will initiate stair negotiation with PT PT Short Term Goal 3 (Week 1): Pt will ambulate 33' with LRAD and min assist  Skilled Therapeutic Interventions/Progress Updates:   Pt in supine and refusing to participate in OOB activity 2/2 neck discomfort and overall not feeling well. Discussed getting up to w/c, going outside for therapy as it was a nice day, or performing bed level exercises, pt continued to decline. Provided trigger point massage to R upper trap musculature as this was where pt reported discomfort. Mild decreased in feelings of stiffness and pt reported some pain relief, did not quantify. Provided skilled education regarding use of heat for muscular relaxation. Ended session in supine w/ heat applied to R upper trap, call bell within reach and all needs met. Missed 18 min of skilled PT 2/2 unwilling to participate.   Therapy Documentation Precautions:  Precautions Precautions: Fall Restrictions Weight Bearing Restrictions: No General: PT Amount of Missed Time (min): 18 Minutes PT Missed Treatment Reason: Patient unwilling to participate Vital Signs: Therapy Vitals Temp: (!) 97.3 F (36.3 C) Temp Source: Oral Pulse Rate: 73 Resp: 18 BP: 116/66 Patient Position (if appropriate): Lying Oxygen Therapy SpO2: 99 % O2 Device: Room Air  See Function Navigator for Current Functional Status.   Therapy/Group: Individual Therapy  Aneesah Hernan K Arnette 03/17/2017, 4:08 PM

## 2017-03-17 NOTE — Progress Notes (Signed)
[facility] PHYSICAL MEDICINE & REHABILITATION     PROGRESS NOTE  Subjective/Complaints:  No c/o except bladder incont.  Last BM 3/1, no dysuria  ROS: Denies CP, SOB, N/V/D.  Objective: Vital Signs: Blood pressure 134/74, pulse 80, temperature 98.1 F (36.7 C), temperature source Oral, resp. rate 18, weight 75.1 kg (165 lb 9.1 oz), SpO2 95 %. No results found. No results for input(s): WBC, HGB, HCT, PLT in the last 72 hours. No results for input(s): NA, K, CL, GLUCOSE, BUN, CREATININE, CALCIUM in the last 72 hours.  Invalid input(s): CO CBG (last 3)  Recent Labs    03/16/17 1621 03/16/17 2056 03/17/17 0645  GLUCAP 162* 181* 142*    Wt Readings from Last 3 Encounters:  03/14/17 75.1 kg (165 lb 9.1 oz)  03/09/17 70.3 kg (155 lb)  11/16/16 70.3 kg (155 lb)    Physical Exam:  BP 134/74 (BP Location: Right Arm)   Pulse 80   Temp 98.1 F (36.7 C) (Oral)   Resp 18   Wt 75.1 kg (165 lb 9.1 oz)   SpO2 95%   BMI 21.84 kg/m  Constitutional: He appears well-developed and well-nourished.  HENT: Bilateral burr hole sites c/d/i Eyes: EOM are normal. No discharge.  Cardiovascular: RRR. No JVD. Respiratory: Effort normal and breath sounds normal.  GI: Bowel sounds are normal. Nondistended Musculoskeletal: No edema or tenderness in extremities  Neurological: He is alert. Oriented to person and place Makes good eye contact with examiner.  Follows simple commands.  Motor: B/l UE: 4/5  B/l LE: HF 4/5, KE 4/5, ADF/PF 4+/5 (stable) Skin: See above  Psychiatric: He has a normal mood and affect. His behavior is normal.   Assessment/Plan: 1. Functional deficits secondary to bilateral subdural hematomas with midline shift. Status post burr hole evacuation 03/09/2017 which require 3+ hours per day of interdisciplinary therapy in a comprehensive inpatient rehab setting. Physiatrist is providing close team supervision and 24 hour management of active medical problems listed  below. Physiatrist and rehab team continue to assess barriers to discharge/monitor patient progress toward functional and medical goals.  Function:  Bathing Bathing position   Position: Sitting EOB  Bathing parts Body parts bathed by patient: Right arm, Left arm, Chest, Abdomen, Front perineal area, Right upper leg, Left upper leg Body parts bathed by helper: Left lower leg, Right lower leg, Buttocks, Back  Bathing assist Assist Level: (70%, moderate assist)      Upper Body Dressing/Undressing Upper body dressing   What is the patient wearing?: Pull over shirt/dress     Pull over shirt/dress - Perfomed by patient: Thread/unthread right sleeve, Thread/unthread left sleeve Pull over shirt/dress - Perfomed by helper: Put head through opening, Pull shirt over trunk        Upper body assist Assist Level: (moderate assist)      Lower Body Dressing/Undressing Lower body dressing   What is the patient wearing?: Non-skid slipper socks, Pants       Pants- Performed by helper: Thread/unthread right pants leg, Thread/unthread left pants leg, Pull pants up/down   Non-skid slipper socks- Performed by helper: Don/doff right sock, Don/doff left sock                  Lower body assist Assist for lower body dressing: (total assist)      Toileting Toileting   Toileting steps completed by patient: Adjust clothing prior to toileting, Adjust clothing after toileting, Performs perineal hygiene Toileting steps completed by helper: Adjust clothing prior to  toileting, Adjust clothing after toileting Toileting Assistive Devices: Grab bar or rail  Toileting assist Assist level: Touching or steadying assistance (Pt.75%)   Transfers Chair/bed transfer   Chair/bed transfer method: Squat pivot Chair/bed transfer assist level: Maximal assist (Pt 25 - 49%/lift and lower) Chair/bed transfer assistive device: Armrests     Locomotion Ambulation     Max distance: 34' Assist level: Touching  or steadying assistance (Pt > 75%)   Wheelchair          Cognition Comprehension Comprehension assist level: Understands basic 75 - 89% of the time/ requires cueing 10 - 24% of the time  Expression Expression assist level: Expresses basic 75 - 89% of the time/requires cueing 10 - 24% of the time. Needs helper to occlude trach/needs to repeat words.  Social Interaction Social Interaction assist level: Interacts appropriately 75 - 89% of the time - Needs redirection for appropriate language or to initiate interaction.  Problem Solving Problem solving assist level: Solves basic 25 - 49% of the time - needs direction more than half the time to initiate, plan or complete simple activities  Memory Memory assist level: Recognizes or recalls 50 - 74% of the time/requires cueing 25 - 49% of the time    Medical Problem List and Plan: 1.  Decreased functional mobility secondary to bilateral subdural hematomas with midline shift. Status post burr hole evacuation 03/09/2017   CIR PT, OT, SLP 2.  DVT Prophylaxis/Anticoagulation: SCDs. Patient is ambulatory 3. Pain Management: Lyrica 50 mg twice a day, Hydrocodone and Robaxin as needed 4. Mood/Alzheimer's. Exelon patch 9.5 mg daily 5. Neuropsych: This patient is not fully capable of making decisions on his own behalf. 6. Skin/Wound Care: Routine skin checks 7. Fluids/Electrolytes/Nutrition: Routine I&O's    BMP within acceptable range on 2/27   Labs ordered for Monday 8. Seizure prophylaxis. Keppra 500 mg twice a day, will wean next week 9. Diabetes mellitus peripheral neuropathy. Hemoglobin A1c 6.9. Check blood sugars before meals and at bedtime. Diabetic teaching   Glucophage 500 mg twice a day, increased to 850 twice a day on 2/28.    CBG (last 3)  Recent Labs    03/16/17 1621 03/16/17 2056 03/17/17 0645  GLUCAP 162* 181* 142*  controlled 3/2 10. Hypertension. Altace 2.5 mg daily    Vitals:   03/16/17 1434 03/17/17 0208  BP: 119/62  134/74  Pulse: 78 80  Resp: 18 18  Temp: (!) 97.4 F (36.3 C) 98.1 F (36.7 C)  SpO2: 99% 95%  Controlled 3/2 11. Hyperlipidemia. Lipitor/Zetia 12. Constipation. Laxative assistance 13. Hypoalbuminemia   Supplement initiated on 2/27 14. ABLA   Hb 12.0 on 2/27   Labs ordered for Monday   Cont to monitor 15.  Urinary incont with urgency no dysuria, ask pt to void into urinal every 2 hr LOS (Days) 4 A FACE TO FACE EVALUATION WAS PERFORMED  Charlett Blake 03/17/2017 7:02 AM

## 2017-03-17 NOTE — Progress Notes (Signed)
Physical Therapy Session Note  Patient Details  Name: Daniel [name] MRN: [id number] Date of Birth: 10-30-1935  Today's Date: 03/17/2017 PT Individual Time: 0932-1030 PT Individual Time Calculation (min): 58 min   Short Term Goals: Week 1:  PT Short Term Goal 1 (Week 1): Pt will transition sit<>stand with mod assist PT Short Term Goal 2 (Week 1): Pt will initiate stair negotiation with PT PT Short Term Goal 3 (Week 1): Pt will ambulate 30' with LRAD and min assist  Skilled Therapeutic Interventions/Progress Updates:    Pt supine in bed upon PT arrival, agreeable to therapy tx and reports pain 7/10 when standing. Pt attempting to use urinal prior to starting therapy, unsuccessful. Therapist looped LEs through pants and pt performed x 3 bridges to pull pants over hips. Pt transferred from supine>sitting EOB with mod assist, verbal and tactile cues for sequencing. Pt seated EOB working on seated balance and coordination to doff shirt and don clean shirt. Pt performed stand pivot transfer with mod assist, max verbal cues for techniques, tactile cues for upright posture. Pt propelled w/c from room x 50 ft using B LEs with supervision and increased time to complete. Pt transferred from w/c<>mat stand pivot with verbal cues for techniques. Pt worked on Therapist, art along with upright posture and minimizing posterior lean, x 1 trial static standing no UE support with manual facilitation for increased hip extension, x 1 trial standing while performing card matching activity. Pt ambulated x 30 ft with RW and min assist, manual facilitation for hip extension/upright posture, verbal cues to aim for wheels of RW for increased step length. Pt transferred from w/c>recliner stand pivot mod assist, left seated with needs in reach and QRB in place.   Therapy Documentation Precautions:  Precautions Precautions: Fall Restrictions Weight Bearing Restrictions: No   See Function Navigator for Current  Functional Status.   Therapy/Group: Individual Therapy  Netta Corrigan, PT, DPT 03/17/2017, 7:22 AM

## 2017-03-18 ENCOUNTER — Inpatient Hospital Stay (HOSPITAL_COMMUNITY): Payer: PPO | Admitting: Physical Therapy

## 2017-03-18 ENCOUNTER — Inpatient Hospital Stay (HOSPITAL_COMMUNITY): Payer: PPO

## 2017-03-18 ENCOUNTER — Encounter (HOSPITAL_COMMUNITY): Payer: Self-pay

## 2017-03-18 LAB — GLUCOSE, CAPILLARY
GLUCOSE-CAPILLARY: 176 mg/dL — AB (ref 65–99)
GLUCOSE-CAPILLARY: 208 mg/dL — AB (ref 65–99)
GLUCOSE-CAPILLARY: 153 mg/dL — AB (ref 65–99)
Glucose-Capillary: 153 mg/dL — ABNORMAL HIGH (ref 65–99)

## 2017-03-18 NOTE — Progress Notes (Signed)
Occupational Therapy Session Note  Patient Details  Name: Daniel [name] MRN: [id number] Date of Birth: July 05, 1935  Today's Date: 03/18/2017 OT Individual Time: 1345-1415 OT Individual Time Calculation (min): 30 min    Short Term Goals: Week 1:  OT Short Term Goal 1 (Week 1): Pt will complete Sit<>stand with consistent Mod A in preparation for BADL tasks OT Short Term Goal 2 (Week 1): Pt will complete toilet transfers with consitent Mod A to reduce caregiver burden  OT Short Term Goal 3 (Week 1): Pt will tolerate standing at the sink for 1 minute to increase activity tolerance for BADL tasks  Skilled Therapeutic Interventions/Progress Updates:    1:1. Pt received seated in w/c from RN. Pt participates in BUE therex with 1# dowel rod to bounce beach ball back to OT 3x30 for BUE strength and endurance required for BADLs with prolonged rest breaks. Pt completes standing peg board activity to work on [place] [facility]/cognition to remake pattern indicated in picture. Pt requires VC for weight shifting and L knee extension with prolonged standing and MOD A for sit to stand/balacne. Exited session with pt seated in bed call light in reach and all needs met.  Therapy Documentation Precautions:  Precautions Precautions: Fall Restrictions Weight Bearing Restrictions: No  See Function Navigator for Current Functional Status.   Therapy/Group: Individual Therapy  [name] 03/18/2017, 2:14 PM

## 2017-03-18 NOTE — Progress Notes (Signed)
[facility] PHYSICAL MEDICINE & REHABILITATION     PROGRESS NOTE  Subjective/Complaints:   No issues overnite, still voiding frequently but no pain with urination , No sweats or chills ROS: Denies CP, SOB, N/V/D.  Objective: Vital Signs: Blood pressure 136/72, pulse 64, temperature 98.4 F (36.9 C), temperature source Oral, resp. rate 18, weight 75.1 kg (165 lb 9.1 oz), SpO2 98 %. No results found. No results for input(s): WBC, HGB, HCT, PLT in the last 72 hours. No results for input(s): NA, K, CL, GLUCOSE, BUN, CREATININE, CALCIUM in the last 72 hours.  Invalid input(s): CO CBG (last 3)  Recent Labs    03/17/17 1621 03/17/17 2101 03/18/17 0627  GLUCAP 148* 161* 153*    Wt Readings from Last 3 Encounters:  03/14/17 75.1 kg (165 lb 9.1 oz)  03/09/17 70.3 kg (155 lb)  11/16/16 70.3 kg (155 lb)    Physical Exam:  BP 136/72 (BP Location: Right Arm)   Pulse 64   Temp 98.4 F (36.9 C) (Oral)   Resp 18   Wt 75.1 kg (165 lb 9.1 oz)   SpO2 98%   BMI 21.84 kg/m  Constitutional: He appears well-developed and well-nourished.  HENT: Bilateral burr hole sites c/d/i Eyes: EOM are normal. No discharge.  Cardiovascular: RRR. No JVD. Respiratory: Effort normal and breath sounds normal.  GI: Bowel sounds are normal. Nondistended Musculoskeletal: No edema or tenderness in extremities  Neurological: He is alert. Oriented to person and place Makes good eye contact with examiner.  Follows simple commands.  Motor: B/l UE: 4/5  B/l LE: HF 4/5, KE 4/5, ADF/PF 4+/5 (stable) Skin: See above  Psychiatric: He has a normal mood and affect. His behavior is normal.   Assessment/Plan: 1. Functional deficits secondary to bilateral subdural hematomas with midline shift. Status post burr hole evacuation 03/09/2017 which require 3+ hours per day of interdisciplinary therapy in a comprehensive inpatient rehab setting. Physiatrist is providing close team supervision and 24 hour management of active  medical problems listed below. Physiatrist and rehab team continue to assess barriers to discharge/monitor patient progress toward functional and medical goals.  Function:  Bathing Bathing position   Position: Sitting EOB  Bathing parts Body parts bathed by patient: Right arm, Left arm, Chest, Abdomen, Front perineal area, Right upper leg, Left upper leg Body parts bathed by helper: Left lower leg, Right lower leg, Buttocks, Back  Bathing assist Assist Level: (70%, moderate assist)      Upper Body Dressing/Undressing Upper body dressing   What is the patient wearing?: Pull over shirt/dress     Pull over shirt/dress - Perfomed by patient: Thread/unthread right sleeve, Thread/unthread left sleeve Pull over shirt/dress - Perfomed by helper: Put head through opening, Pull shirt over trunk        Upper body assist Assist Level: (moderate assist)      Lower Body Dressing/Undressing Lower body dressing   What is the patient wearing?: Non-skid slipper socks, Pants       Pants- Performed by helper: Thread/unthread right pants leg, Thread/unthread left pants leg, Pull pants up/down   Non-skid slipper socks- Performed by helper: Don/doff right sock, Don/doff left sock                  Lower body assist Assist for lower body dressing: (total assist)      Toileting Toileting   Toileting steps completed by patient: Adjust clothing prior to toileting, Adjust clothing after toileting, Performs perineal hygiene Toileting steps completed by  helper: Adjust clothing prior to toileting, Adjust clothing after toileting Toileting Assistive Devices: Grab bar or rail  Toileting assist Assist level: Touching or steadying assistance (Pt.75%)   Transfers Chair/bed transfer   Chair/bed transfer method: Stand pivot Chair/bed transfer assist level: Moderate assist (Pt 50 - 74%/lift or lower) Chair/bed transfer assistive device: Armrests     Locomotion Ambulation     Max distance: 30  ft Assist level: Touching or steadying assistance (Pt > 75%)   Wheelchair          Cognition Comprehension Comprehension assist level: Understands basic 75 - 89% of the time/ requires cueing 10 - 24% of the time  Expression Expression assist level: Expresses basic 75 - 89% of the time/requires cueing 10 - 24% of the time. Needs helper to occlude trach/needs to repeat words.  Social Interaction Social Interaction assist level: Interacts appropriately 75 - 89% of the time - Needs redirection for appropriate language or to initiate interaction.  Problem Solving Problem solving assist level: Solves basic 50 - 74% of the time/requires cueing 25 - 49% of the time  Memory Memory assist level: Recognizes or recalls 50 - 74% of the time/requires cueing 25 - 49% of the time    Medical Problem List and Plan: 1.  Decreased functional mobility secondary to bilateral subdural hematomas with midline shift. Status post burr hole evacuation 03/09/2017   CIR PT, OT, SLP 2.  DVT Prophylaxis/Anticoagulation: SCDs. Patient is ambulatory 3. Pain Management: Lyrica 50 mg twice a day, Hydrocodone and Robaxin as needed 4. Mood/Alzheimer's. Exelon patch 9.5 mg daily 5. Neuropsych: This patient is not fully capable of making decisions on his own behalf. 6. Skin/Wound Care: Routine skin checks 7. Fluids/Electrolytes/Nutrition: Routine I&O's    BMP within acceptable range on 2/27   Labs ordered for Monday 8. Seizure prophylaxis. Keppra 500 mg twice a day, will wean next week 9. Diabetes mellitus peripheral neuropathy. Hemoglobin A1c 6.9. Check blood sugars before meals and at bedtime. Diabetic teaching   Glucophage 500 mg twice a day, increased to 850 twice a day on 2/28.    CBG (last 3)  Recent Labs    03/17/17 1621 03/17/17 2101 03/18/17 0627  GLUCAP 148* 161* 153*  controlled 3/3 10. Hypertension. Altace 2.5 mg daily    Vitals:   03/17/17 1420 03/18/17 0600  BP: 116/66 136/72  Pulse: 73 64  Resp:  18   Temp: (!) 97.3 F (36.3 C) 98.4 F (36.9 C)  SpO2: 99% 98%  Controlled 3/3 11. Hyperlipidemia. Lipitor/Zetia 12. Constipation. Laxative assistance 13. Hypoalbuminemia   Supplement initiated on 2/27 14. ABLA   Hb 12.0 on 2/27   Labs ordered for Monday   Cont to monitor 15.  Urinary incont with urgency no dysuria, ask pt to void into urinal every 2 hr, no dysuria or fever , no MS changes will hold off on UA LOS (Days) 5 A FACE TO FACE EVALUATION WAS PERFORMED  Charlett Blake 03/18/2017 6:40 AM

## 2017-03-18 NOTE — Progress Notes (Signed)
Physical Therapy Session Note  Patient Details  Name: Daniel [name] MRN: [id number] Date of Birth: 02-13-1935  Today's Date: 03/18/2017 PT Individual Time: 0900-0957 PT Individual Time Calculation (min): 57 min   Short Term Goals: Week 1:  PT Short Term Goal 1 (Week 1): Pt will transition sit<>stand with mod assist PT Short Term Goal 2 (Week 1): Pt will initiate stair negotiation with PT PT Short Term Goal 3 (Week 1): Pt will ambulate 49' with LRAD and min assist  Skilled Therapeutic Interventions/Progress Updates:    no c/o pain, but pt reports feeling extremely fatigued.  Agreeable to therapy within room focus on ADLs.  Pt requires increased time and encouragement for all activities this AM.    Supine>sit with min assist and verbal cues for sequencing.  Max cues for scooting to EOB with forward weight shift and loading LEs.  Squat/pivot to w/c with max>total assist.  Pt engaged in grooming from w/c at sink focus on object manipulation and sequencing for oral care, face washing, and shaving.  Attempted to have pt complete some grooming tasks in standing but he declined 2/2 fatigue.  Requesting to return to bed at end of session, mod assist for stand/pivot back to bed and min assist to lift LLE into bed.  Pt positioned to comfort with alarm activated, call bell in reach and needs met.   Therapy Documentation Precautions:  Precautions Precautions: Fall Restrictions Weight Bearing Restrictions: No   See Function Navigator for Current Functional Status.   Therapy/Group: Individual Therapy  Michel Santee 03/18/2017, 9:58 AM

## 2017-03-18 NOTE — Progress Notes (Signed)
Occupational Therapy Session Note  Patient Details  Name: Daniel [name] MRN: [id number] Date of Birth: May 22, 1935  Today's Date: 03/18/2017 OT Individual Time: 1100-1200 OT Individual Time Calculation (min): 60 min    Short Term Goals: Week 1:  OT Short Term Goal 1 (Week 1): Pt will complete Sit<>stand with consistent Mod A in preparation for BADL tasks OT Short Term Goal 2 (Week 1): Pt will complete toilet transfers with consitent Mod A to reduce caregiver burden  OT Short Term Goal 3 (Week 1): Pt will tolerate standing at the sink for 1 minute to increase activity tolerance for BADL tasks  Skilled Therapeutic Interventions/Progress Updates:    1:1. Pt requires time to arouse. Pt without c/o pain. Pt dons pants with VC for problem solving threading LLE first and crossing into seated figure 4 to thread RLE with touching A for sitting balance d/t posterior pelvic tilt. Pt sit to stand from EOB with MOD A for lifting and OT advances pants past hips with VC for anterior weight shift to correct posterior bias. Pt unable to don socks, and OT demo sock aide. Pt able to return demo with total cueing for first sock and mod cueing for technique to don second sock. Pt reporting need to have BM. Pt returned to room with total A for time management and transfers with grab bar and MOD A for lifting as OT  Advances pants down hips d/t urgency. Pt has continent BM in toilet and pt requires total A for posterior hygiene in standing with RW and touching A for balance. Pt able to complete clothing management after toileting with touching A. Pt stands at sink to wash hands with similar balance A as above. Pt stand pivot transfer w/c<>recliner with RW and MOD A for lifting/manual facilitation of anterior weight shift. Pt states will call for assist with mobility if needed. Exited session with pt seated in recliner with call light in reach and all needs met.  Therapy Documentation Precautions:   Precautions Precautions: Fall Restrictions Weight Bearing Restrictions: No     See Function Navigator for Current Functional Status.   Therapy/Group: Individual Therapy  [name] 03/18/2017, 12:00 PM

## 2017-03-18 NOTE — Progress Notes (Signed)
Physical Therapy Session Note  Patient Details  Name: Daniel [name] MRN: [id number] Date of Birth: September 24, 1935  Today's Date: 03/18/2017 PT Individual Time: 1300-1343 PT Individual Time Calculation (min): 43 min   Short Term Goals: Week 1:  PT Short Term Goal 1 (Week 1): Pt will transition sit<>stand with mod assist PT Short Term Goal 2 (Week 1): Pt will initiate stair negotiation with PT PT Short Term Goal 3 (Week 1): Pt will ambulate 32' with LRAD and min assist  Skilled Therapeutic Interventions/Progress Updates:    no c/o pain, but again reports fatigue.  Session focus on transfers and activity tolerance.  Pt requires mod assist for stand/pivots with RW throughout session.  Cues for forward weight shift and hand placement to facilitate power up.  Pt completes 10 minutes on Nustep with occasional rest break, level 2, focus on activity tolerance, reciprocal stepping pattern retraining, knee AAROM, and trunk rotation.  PT switched pt's w/c cushion for 3" height cushion for improved sitting tolerance and positioning in w/c.  Returned to room at end of session and positioned with QRB in place and call bell in reach to await OT session.   Therapy Documentation Precautions:  Precautions Precautions: Fall Restrictions Weight Bearing Restrictions: No   See Function Navigator for Current Functional Status.   Therapy/Group: Individual Therapy  Michel Santee 03/18/2017, 1:44 PM

## 2017-03-19 ENCOUNTER — Inpatient Hospital Stay (HOSPITAL_COMMUNITY): Payer: PPO | Admitting: Occupational Therapy

## 2017-03-19 ENCOUNTER — Inpatient Hospital Stay (HOSPITAL_COMMUNITY): Payer: PPO | Admitting: Physical Therapy

## 2017-03-19 DIAGNOSIS — R32 Unspecified urinary incontinence: Secondary | ICD-10-CM

## 2017-03-19 LAB — URINALYSIS, COMPLETE (UACMP) WITH MICROSCOPIC
BACTERIA UA: NONE SEEN
BILIRUBIN URINE: NEGATIVE
Glucose, UA: NEGATIVE mg/dL
Hgb urine dipstick: NEGATIVE
KETONES UR: NEGATIVE mg/dL
LEUKOCYTES UA: NEGATIVE
Nitrite: NEGATIVE
PH: 7 (ref 5.0–8.0)
PROTEIN: NEGATIVE mg/dL
Specific Gravity, Urine: 1.015 (ref 1.005–1.030)
Squamous Epithelial / LPF: NONE SEEN

## 2017-03-19 LAB — GLUCOSE, CAPILLARY
GLUCOSE-CAPILLARY: 154 mg/dL — AB (ref 65–99)
GLUCOSE-CAPILLARY: 160 mg/dL — AB (ref 65–99)
Glucose-Capillary: 160 mg/dL — ABNORMAL HIGH (ref 65–99)
Glucose-Capillary: 161 mg/dL — ABNORMAL HIGH (ref 65–99)

## 2017-03-19 LAB — BASIC METABOLIC PANEL
ANION GAP: 12 (ref 5–15)
BUN: 23 mg/dL — ABNORMAL HIGH (ref 6–20)
CHLORIDE: 99 mmol/L — AB (ref 101–111)
CO2: 26 mmol/L (ref 22–32)
Calcium: 10 mg/dL (ref 8.9–10.3)
Creatinine, Ser: 0.8 mg/dL (ref 0.61–1.24)
Glucose, Bld: 179 mg/dL — ABNORMAL HIGH (ref 65–99)
POTASSIUM: 4.5 mmol/L (ref 3.5–5.1)
SODIUM: 137 mmol/L (ref 135–145)

## 2017-03-19 LAB — CBC WITH DIFFERENTIAL/PLATELET
Basophils Absolute: 0 10*3/uL (ref 0.0–0.1)
Basophils Relative: 0 %
EOS PCT: 7 %
Eosinophils Absolute: 0.6 10*3/uL (ref 0.0–0.7)
HCT: 43.1 % (ref 39.0–52.0)
HEMOGLOBIN: 13.9 g/dL (ref 13.0–17.0)
LYMPHS ABS: 2.2 10*3/uL (ref 0.7–4.0)
LYMPHS PCT: 23 %
MCH: 29.3 pg (ref 26.0–34.0)
MCHC: 32.3 g/dL (ref 30.0–36.0)
MCV: 90.7 fL (ref 78.0–100.0)
Monocytes Absolute: 0.5 10*3/uL (ref 0.1–1.0)
Monocytes Relative: 6 %
NEUTROS PCT: 64 %
Neutro Abs: 6 10*3/uL (ref 1.7–7.7)
PLATELETS: 318 10*3/uL (ref 150–400)
RBC: 4.75 MIL/uL (ref 4.22–5.81)
RDW: 14 % (ref 11.5–15.5)
WBC: 9.3 10*3/uL (ref 4.0–10.5)

## 2017-03-19 MED ORDER — METFORMIN HCL 500 MG PO TABS
1000.0000 mg | ORAL_TABLET | Freq: Two times a day (BID) | ORAL | Status: DC
  Administered 2017-03-19 – 2017-03-28 (×18): 1000 mg via ORAL
  Filled 2017-03-19 (×18): qty 2

## 2017-03-19 NOTE — Progress Notes (Signed)
Physical Therapy Note  Patient Details  Name: Daniel [name] MRN: [id number] Date of Birth: [birth year] Today's Date: 03/19/2017    Time: [phone] 56 minutes  1:1 No c/o pain.  Pt c/o jaw "spasms", PT attempts trigger point release Rt scapular mm working towards jaw, pt unable to tolerate.  Sit to stand blocked practice with pt requiring mod/max A to stand from w/c and recliner.  Gait with RW 25', 50' x 2 with close supervision, small shuffling steps, c/o legs feeling "heavy" with each repetition.  Stair negotiation 4 stairs with bilat handrails x 2 attempts with min A, increased time.  Nustep level 3 for UE/LE strength and endurance with pt requiring rest after 4 minutes, then able to pedal 2 more minutes before limited by fatigue. Pt requires frequent rest breaks and encouragement throughout session.   Daniel [name] 03/19/2017, 10:41 AM

## 2017-03-19 NOTE — Progress Notes (Signed)
Occupational Therapy Session Note  Patient Details  Name: Daniel [name] MRN: [id number] Date of Birth: [birth year]  Today's Date: 03/19/2017 OT Individual Time: 1100-1130 OT Individual Time Calculation (min): 30 min    Short Term Goals: Week 1:  OT Short Term Goal 1 (Week 1): Pt will complete Sit<>stand with consistent Mod A in preparation for BADL tasks OT Short Term Goal 2 (Week 1): Pt will complete toilet transfers with consitent Mod A to reduce caregiver burden  OT Short Term Goal 3 (Week 1): Pt will tolerate standing at the sink for 1 minute to increase activity tolerance for BADL tasks  Skilled Therapeutic Interventions/Progress Updates:    Pt presents sitting up in recliner, reporting fatigue though agreeable to OT tx session. Pt reporting need to use urinal, completing sit<>stand at RW with ModA, completing x2 during task completion with therapist providing [place] for clothing management and with Pt returning to sitting to use urinal during task, requiring increased time for attempts to void bladder. Pt then engaging in seated UB exercise using 1lb dowel rod across multiple planes for 10x each set and with rest breaks throughout with focus on increasing UB strength and endurance. Pt left seated in recliner end of session, QRB donned, call bell and needs within reach.   Therapy Documentation Precautions:  Precautions Precautions: Fall Restrictions Weight Bearing Restrictions: No    Pain: Pain Assessment Pain Assessment: No/denies pain ADL: ADL ADL Comments: Please see functional navigator  See Function Navigator for Current Functional Status.   Therapy/Group: Individual Therapy  [name] 03/19/2017, 1:06 PM

## 2017-03-19 NOTE — Progress Notes (Signed)
[place] PHYSICAL MEDICINE & REHABILITATION     PROGRESS NOTE  Subjective/Complaints:  Pt seen lying in bed this AM.  He slept well overnight and states he had a good weekend.  He requests I make him better.   ROS: Denies CP, SOB, N/V/D.  Objective: Vital Signs: Blood pressure 127/72, pulse 80, temperature 97.9 F (36.6 C), temperature source Oral, resp. rate 18, weight 75.1 kg (165 lb 9.1 oz), SpO2 99 %. No results found. No results for input(s): WBC, HGB, HCT, PLT in the last 72 hours. No results for input(s): NA, K, CL, GLUCOSE, BUN, CREATININE, CALCIUM in the last 72 hours.  Invalid input(s): CO CBG (last 3)  Recent Labs    03/18/17 1621 03/18/17 2050 03/19/17 0608  GLUCAP 208* 153* 160*    Wt Readings from Last 3 Encounters:  03/14/17 75.1 kg (165 lb 9.1 oz)  03/09/17 70.3 kg (155 lb)  11/16/16 70.3 kg (155 lb)    Physical Exam:  BP 127/72 (BP Location: Right Arm)   Pulse 80   Temp 97.9 F (36.6 C) (Oral)   Resp 18   Wt 75.1 kg (165 lb 9.1 oz)   SpO2 99%   BMI 21.84 kg/m  Constitutional: He appears well-developed and well-nourished.  HENT: Bilateral burr hole sites c/d/i Eyes: EOM are normal. No discharge.  Cardiovascular: RRR. No JVD. Respiratory: Effort normal and breath sounds normal.  GI: Bowel sounds are normal. Nondistended Musculoskeletal: No edema or tenderness in extremities  Neurological: He is alert. Oriented to person and place Makes good eye contact with examiner.  Follows simple commands.  Motor: B/l UE: 4/5  B/l LE: HF 4/5, KE 4/5, ADF/PF 4+/5 (unchanged) Skin: See above  Psychiatric: He has a normal mood and affect. His behavior is normal.   Assessment/Plan: 1. Functional deficits secondary to bilateral subdural hematomas with midline shift. Status post burr hole evacuation 03/09/2017 which require 3+ hours per day of interdisciplinary therapy in a comprehensive inpatient rehab setting. Physiatrist is providing close team supervision  and 24 hour management of active medical problems listed below. Physiatrist and rehab team continue to assess barriers to discharge/monitor patient progress toward functional and medical goals.  Function:  Bathing Bathing position   Position: Sitting EOB  Bathing parts Body parts bathed by patient: Right arm, Left arm, Chest, Abdomen, Front perineal area, Right upper leg, Left upper leg Body parts bathed by helper: Left lower leg, Right lower leg, Buttocks, Back  Bathing assist Assist Level: (70%, moderate assist)      Upper Body Dressing/Undressing Upper body dressing   What is the patient wearing?: Pull over shirt/dress     Pull over shirt/dress - Perfomed by patient: Thread/unthread right sleeve, Thread/unthread left sleeve Pull over shirt/dress - Perfomed by helper: Put head through opening, Pull shirt over trunk        Upper body assist Assist Level: (moderate assist)      Lower Body Dressing/Undressing Lower body dressing   What is the patient wearing?: Non-skid slipper socks, Pants       Pants- Performed by helper: Thread/unthread right pants leg, Thread/unthread left pants leg, Pull pants up/down   Non-skid slipper socks- Performed by helper: Don/doff right sock, Don/doff left sock                  Lower body assist Assist for lower body dressing: (total assist)      Toileting Toileting   Toileting steps completed by patient: Adjust clothing prior to toileting,  Adjust clothing after toileting, Performs perineal hygiene Toileting steps completed by helper: Adjust clothing prior to toileting, Adjust clothing after toileting Toileting Assistive Devices: Grab bar or rail  Toileting assist Assist level: Touching or steadying assistance (Pt.75%)   Transfers Chair/bed transfer   Chair/bed transfer method: Stand pivot, Squat pivot Chair/bed transfer assist level: Moderate assist (Pt 50 - 74%/lift or lower) Chair/bed transfer assistive device: Armrests      Locomotion Ambulation     Max distance: 30 ft Assist level: Touching or steadying assistance (Pt > 75%)   Wheelchair          Cognition Comprehension Comprehension assist level: Understands basic 75 - 89% of the time/ requires cueing 10 - 24% of the time  Expression Expression assist level: Expresses basic 75 - 89% of the time/requires cueing 10 - 24% of the time. Needs helper to occlude trach/needs to repeat words.  Social Interaction Social Interaction assist level: Interacts appropriately 75 - 89% of the time - Needs redirection for appropriate language or to initiate interaction.  Problem Solving Problem solving assist level: Solves basic 50 - 74% of the time/requires cueing 25 - 49% of the time  Memory Memory assist level: Recognizes or recalls 50 - 74% of the time/requires cueing 25 - 49% of the time    Medical Problem List and Plan: 1.  Decreased functional mobility secondary to bilateral subdural hematomas with midline shift. Status post burr hole evacuation 03/09/2017   Cont CIR  2.  DVT Prophylaxis/Anticoagulation: SCDs. Patient is ambulatory 3. Pain Management: Lyrica 50 mg twice a day, Hydrocodone and Robaxin as needed 4. Mood/Alzheimer's. Exelon patch 9.5 mg daily 5. Neuropsych: This patient is not fully capable of making decisions on his own behalf. 6. Skin/Wound Care: Routine skin checks 7. Fluids/Electrolytes/Nutrition: Routine I&O's    BMP within acceptable range on 2/27   Labs pending 8. Seizure prophylaxis. Keppra 500 mg twice a day, weaned on 3/4 9. Diabetes mellitus peripheral neuropathy. Hemoglobin A1c 6.9. Check blood sugars before meals and at bedtime. Diabetic teaching   Glucophage 500 mg twice a day, increased to 850 twice a day on 2/28, increased to 1000 BID on 3/4.    CBG (last 3)  Recent Labs    03/18/17 1621 03/18/17 2050 03/19/17 0608  GLUCAP 208* 153* 160*   10. Hypertension. Altace 2.5 mg daily    Vitals:   03/18/17 0600 03/18/17 1500   BP: 136/72 127/72  Pulse: 64 80  Resp:  18  Temp: 98.4 F (36.9 C) 97.9 F (36.6 C)  SpO2: 98% 99%    Controlled 3/4 11. Hyperlipidemia. Lipitor/Zetia 12. Constipation. Laxative assistance 13. Hypoalbuminemia   Supplement initiated on 2/27 14. ABLA   Hb 12.0 on 2/27   Labs pending   Cont to monitor 15.  Urinary incont with urgency    Timed toileting   UA ordered  LOS (Days) 6 A FACE TO FACE EVALUATION WAS PERFORMED  Ankit Lorie Phenix 03/19/2017 8:13 AM

## 2017-03-19 NOTE — Plan of Care (Signed)
  Not Progressing RH BLADDER ELIMINATION RH STG MANAGE BLADDER WITH ASSISTANCE Description STG Manage Bladder With Assistance min  03/19/2017 1253 - Not Progressing by Marney Setting, RN Note Total assist  RH SKIN INTEGRITY RH STG ABLE TO PERFORM INCISION/WOUND CARE W/ASSISTANCE Description STG Able To Perform Incision/Wound Care With min Assistance.  03/19/2017 1253 - Not Progressing by Marney Setting, RN Note Total assist

## 2017-03-19 NOTE — Progress Notes (Signed)
Occupational Therapy Session Note  Patient Details  Name: Daniel [name] MRN: [id number] Date of Birth: 07-04-1935  Today's Date: 03/19/2017 OT Individual Time: 4944-9675 and 9163-8466 OT Individual Time Calculation (min): 57 min and 45 min   Short Term Goals: Week 1:  OT Short Term Goal 1 (Week 1): Pt will complete Sit<>stand with consistent Mod A in preparation for BADL tasks OT Short Term Goal 2 (Week 1): Pt will complete toilet transfers with consitent Mod A to reduce caregiver burden  OT Short Term Goal 3 (Week 1): Pt will tolerate standing at the sink for 1 minute to increase activity tolerance for BADL tasks  Skilled Therapeutic Interventions/Progress Updates:    1) Treatment session with focus on sit > stand, standing balance, and LB dressing.  Pt received asleep in bed, easily awakened.  Pt reports not typically waking until late morning or early afternoon, max encouragement to participate in treatment session.  Pt requested therapist to don pants for pt in bed, educated pt on goals and encouraged pt to attempt threading pant legs at bed level or seated EOB.  Increased time to complete bed mobility and pt able to thread RLE with max encouragement.  Max assist sit > stand with tactile cues and manual facilitation for anterior weight shift.  Pt able to pull pants over hips with min assist to maintain standing balance.  Ambulated to recliner with RW with min guard.  Setup for breakfast with pt requiring assist to open containers.  Engaged in sit > stand x3 with max assist for sit > stand.  Left upright in recliner with all needs in reach and quick release belt intact.  2) Treatment session with focus on sit <> stand and upright standing posture.  Pt received upright in recliner reporting pain in buttocks.  Engaged in sit > stand from recliner with max assist from low surface.  Pt ambulated 15' with RW with min assist to w/c, mod cues for upright posture as pt demonstrates cervical flexion and  rounded shoulders.  Engaged in sit > stand at high-low table with focus on anterior weight shift to decrease burden of care with sit > stand.  Engaged in table top activity in standing to increase standing tolerance and promote upright standing posture, intermittent cues for posture.  Returned to room and ambulated 15' to bed with RW and min assist.  Left supine in bed with bed alarm set and all needs in reach.  Therapy Documentation Precautions:  Precautions Precautions: Fall Restrictions Weight Bearing Restrictions: No Pain:  Pt reports pain in Lt hip with LB dressing, RN notified  See Function Navigator for Current Functional Status.   Therapy/Group: Individual Therapy  [name] 03/19/2017, 8:29 AM

## 2017-03-20 ENCOUNTER — Inpatient Hospital Stay (HOSPITAL_COMMUNITY): Payer: PPO | Admitting: Occupational Therapy

## 2017-03-20 ENCOUNTER — Inpatient Hospital Stay (HOSPITAL_COMMUNITY): Payer: PPO | Admitting: Physical Therapy

## 2017-03-20 DIAGNOSIS — R7989 Other specified abnormal findings of blood chemistry: Secondary | ICD-10-CM

## 2017-03-20 LAB — GLUCOSE, CAPILLARY
GLUCOSE-CAPILLARY: 146 mg/dL — AB (ref 65–99)
GLUCOSE-CAPILLARY: 140 mg/dL — AB (ref 65–99)
Glucose-Capillary: 138 mg/dL — ABNORMAL HIGH (ref 65–99)
Glucose-Capillary: 159 mg/dL — ABNORMAL HIGH (ref 65–99)

## 2017-03-20 NOTE — Progress Notes (Signed)
Occupational Therapy Note  Patient Details  Name: Daniel [name] MRN: [id number] Date of Birth: 1935-03-08  Today's Date: 03/20/2017 OT Missed Time: 55 Minutes Missed Time Reason: Patient fatigue  Pt missed session scheduled 1300-1345 due to fatigue.  Pt asleep upon arrival and had not eaten lunch.  Pt states "not to be rude" "but I'm beat".  Discussed current therapy goals and needed progress to prepare for d/c.  Pt requesting therapist return later in the day to allow for rest and time to eat.  Returned at later time as requested per pt to make up earlier missed time and engaged in scheduled therapy session.  Pt asleep requiring increased time to arouse.  Pt reports still quite fatigued and requesting to defer therapy until tomorrow.  Will continue to follow as pt participation allows.   [name] 03/20/2017, 3:28 PM

## 2017-03-20 NOTE — Progress Notes (Signed)
Physical Therapy Note  Patient Details  Name: Daniel [name] MRN: [id number] Date of Birth: Apr 20, 1935 Today's Date: 03/20/2017    Time: 1100-1150 50 minutes  1:1 No c/o pain. Pt performs grooming at sink with set up assistance.  PT attempts to encourage pt to stand during grooming but pt refuses, stating "I'm just too weak".  With encouragement, pt agreeable to attempt gait training. Pt performs gait with min A 25', 35' with RW including side and backward stepping.  Sit to stand repetitions x 5 from w/c with mod/max A.  Pt able to perform sit to supine with increased time and supervision.  PT gave home measurement sheet to pt and encouraged him to have his wife fill it out so we can continue sit to stand practice at heights of furniture at home, pt verbalizes understanding.   Halei Hanover 03/20/2017, 12:16 PM

## 2017-03-20 NOTE — Progress Notes (Signed)
Physical Therapy Weekly Progress Note  Patient Details  Name: Daniel [name] MRN: [id number] Date of Birth: 1935-12-01  Beginning of progress report period: March 14, 2017 End of progress report period: March 20, 2017   Patient has met 1 of 3 short term goals.  Pt still fluctuating between mod/max A with transfers and gait distance limited by fatigue and decreased motivation.  Patient continues to demonstrate the following deficits muscle weakness, decreased motor planning and decreased standing balance, decreased postural control and decreased balance strategies and therefore will continue to benefit from skilled PT intervention to increase functional independence with mobility.  Patient not progressing toward long term goals.  See goal revision..  Continue plan of care.  PT Short Term Goals Week 1:  PT Short Term Goal 1 (Week 1): Pt will transition sit<>stand with mod assist PT Short Term Goal 1 - Progress (Week 1): Progressing toward goal PT Short Term Goal 2 (Week 1): Pt will initiate stair negotiation with PT PT Short Term Goal 2 - Progress (Week 1): Met PT Short Term Goal 3 (Week 1): Pt will ambulate 69' with LRAD and min assist PT Short Term Goal 3 - Progress (Week 1): Progressing toward goal Week 2:  PT Short Term Goal 1 (Week 2): =LTG  Skilled Therapeutic Interventions/Progress Updates:  Ambulation/gait training;Balance/vestibular training;Community reintegration;Discharge planning;DME/adaptive equipment instruction;Functional mobility training;Neuromuscular re-education;Pain management;Patient/family education;Psychosocial support;Splinting/orthotics;Stair training;Therapeutic Activities;Therapeutic Exercise;UE/LE Strength taining/ROM;UE/LE Coordination activities;Visual/perceptual remediation/compensation    See Function Navigator for Current Functional Status.   Tori Dattilio 03/20/2017, 1:51 PM

## 2017-03-20 NOTE — Progress Notes (Signed)
[place] PHYSICAL MEDICINE & REHABILITATION     PROGRESS NOTE  Subjective/Complaints:  Pt seen lying in bed this AM.  He slept well overnight.    ROS: Denies CP, SOB, N/V/D.  Objective: Vital Signs: Blood pressure 124/62, pulse 65, temperature 98.2 F (36.8 C), resp. rate 16, weight 75.1 kg (165 lb 9.1 oz), SpO2 99 %. No results found. Recent Labs    03/19/17 0806  WBC 9.3  HGB 13.9  HCT 43.1  PLT 318   Recent Labs    03/19/17 0806  NA 137  K 4.5  CL 99*  GLUCOSE 179*  BUN 23*  CREATININE 0.80  CALCIUM 10.0   CBG (last 3)  Recent Labs    03/19/17 1620 03/19/17 2129 03/20/17 0653  GLUCAP 161* 160* 138*    Wt Readings from Last 3 Encounters:  03/14/17 75.1 kg (165 lb 9.1 oz)  03/09/17 70.3 kg (155 lb)  11/16/16 70.3 kg (155 lb)    Physical Exam:  BP 124/62 (BP Location: Right Arm)   Pulse 65   Temp 98.2 F (36.8 C)   Resp 16   Wt 75.1 kg (165 lb 9.1 oz)   SpO2 99%   BMI 21.84 kg/m  Constitutional: He appears well-developed and well-nourished.  HENT: Bilateral burr hole sites c/d/i Eyes: EOM are normal. No discharge.  Cardiovascular: RRR. No JVD. Respiratory: Effort normal and breath sounds normal.  GI: Bowel sounds are normal. Nondistended Musculoskeletal: No edema or tenderness in extremities  Neurological: He is alert.  Makes good eye contact with examiner.  Follows simple commands.  Motor: B/l UE: 4/5  B/l LE: HF 4/5, KE 4/5, ADF/PF 4+/5 (stable) Skin: See above  Psychiatric: He has a normal mood and affect. His behavior is normal.   Assessment/Plan: 1. Functional deficits secondary to bilateral subdural hematomas with midline shift. Status post burr hole evacuation 03/09/2017 which require 3+ hours per day of interdisciplinary therapy in a comprehensive inpatient rehab setting. Physiatrist is providing close team supervision and 24 hour management of active medical problems listed below. Physiatrist and rehab team continue to assess  barriers to discharge/monitor patient progress toward functional and medical goals.  Function:  Bathing Bathing position   Position: Sitting EOB  Bathing parts Body parts bathed by patient: Right arm, Left arm, Chest, Abdomen, Front perineal area, Right upper leg, Left upper leg Body parts bathed by helper: Left lower leg, Right lower leg, Buttocks, Back  Bathing assist Assist Level: (70%, moderate assist)      Upper Body Dressing/Undressing Upper body dressing   What is the patient wearing?: Pull over shirt/dress     Pull over shirt/dress - Perfomed by patient: Thread/unthread right sleeve, Thread/unthread left sleeve Pull over shirt/dress - Perfomed by helper: Put head through opening, Pull shirt over trunk        Upper body assist Assist Level: (moderate assist)      Lower Body Dressing/Undressing Lower body dressing   What is the patient wearing?: Pants, Shoes     Pants- Performed by patient: Thread/unthread right pants leg, Pull pants up/down Pants- Performed by helper: Thread/unthread left pants leg   Non-skid slipper socks- Performed by helper: Don/doff right sock, Don/doff left sock       Shoes - Performed by helper: Don/doff right shoe, Don/doff left shoe          Lower body assist Assist for lower body dressing: (Max assist)      Toileting Toileting   Toileting steps completed by  patient: Adjust clothing prior to toileting, Adjust clothing after toileting, Performs perineal hygiene Toileting steps completed by helper: Adjust clothing prior to toileting, Adjust clothing after toileting Toileting Assistive Devices: Grab bar or rail  Toileting assist Assist level: Touching or steadying assistance (Pt.75%)   Transfers Chair/bed transfer   Chair/bed transfer method: Ambulatory Chair/bed transfer assist level: Moderate assist (Pt 50 - 74%/lift or lower) Chair/bed transfer assistive device: Medical sales representative     Max distance: 30  ft Assist level: Touching or steadying assistance (Pt > 75%)   Wheelchair          Cognition Comprehension Comprehension assist level: Understands basic 75 - 89% of the time/ requires cueing 10 - 24% of the time  Expression Expression assist level: Expresses basic 75 - 89% of the time/requires cueing 10 - 24% of the time. Needs helper to occlude trach/needs to repeat words.  Social Interaction Social Interaction assist level: Interacts appropriately 75 - 89% of the time - Needs redirection for appropriate language or to initiate interaction.  Problem Solving Problem solving assist level: Solves basic 75 - 89% of the time/requires cueing 10 - 24% of the time  Memory Memory assist level: Recognizes or recalls 50 - 74% of the time/requires cueing 25 - 49% of the time    Medical Problem List and Plan: 1.  Decreased functional mobility secondary to bilateral subdural hematomas with midline shift. Status post burr hole evacuation 03/09/2017   Cont CIR  2.  DVT Prophylaxis/Anticoagulation: SCDs. Patient is ambulatory 3. Pain Management: Lyrica 50 mg twice a day, Hydrocodone and Robaxin as needed 4. Mood/Alzheimer's. Exelon patch 9.5 mg daily 5. Neuropsych: This patient is not fully capable of making decisions on his own behalf. 6. Skin/Wound Care: Routine skin checks 7. Fluids/Electrolytes/Nutrition: Routine I&O's    BUN/CR trending up on 3/4    Encourage fluids 8. Seizure prophylaxis. Keppra 500 mg twice a day, d/ced on 3/4 9. Diabetes mellitus peripheral neuropathy. Hemoglobin A1c 6.9. Check blood sugars before meals and at bedtime. Diabetic teaching   Glucophage 500 mg twice a day, increased to 850 twice a day on 2/28, increased to 1000 BID on 3/4.   Will consider additional meds tomorrow    CBG (last 3)  Recent Labs    03/19/17 1620 03/19/17 2129 03/20/17 0653  GLUCAP 161* 160* 138*   10. Hypertension. Altace 2.5 mg daily    Vitals:   03/19/17 1426 03/20/17 0600  BP: 110/61  124/62  Pulse: 65 65  Resp: 18 16  Temp: 98 F (36.7 C) 98.2 F (36.8 C)  SpO2: 99% 99%    Controlled 3/5 11. Hyperlipidemia. Lipitor/Zetia 12. Constipation. Laxative assistance 13. Hypoalbuminemia   Supplement initiated on 2/27 14. ABLA: Resolved   Hb 13.9 on 3/4   Cont to monitor 15.  Urinary incont with urgency    Timed toileting   UA unremarkable  LOS (Days) 7 A FACE TO FACE EVALUATION WAS PERFORMED  Staria Birkhead Lorie Phenix 03/20/2017 7:39 AM

## 2017-03-20 NOTE — Plan of Care (Signed)
  Not Progressing RH BLADDER ELIMINATION RH STG MANAGE BLADDER WITH ASSISTANCE Description STG Manage Bladder With Assistance min  03/20/2017 1513 - Not Progressing by Marney Setting, RN Note Total assist- incontinent

## 2017-03-20 NOTE — Progress Notes (Signed)
Occupational Therapy Session Note  Patient Details  Name: Daniel [name] MRN: [id number] Date of Birth: 1935/11/26  Today's Date: 03/20/2017 OT Individual Time: 1000-1100 OT Individual Time Calculation (min): 60 min    Short Term Goals: Week 1:  OT Short Term Goal 1 (Week 1): Pt will complete Sit<>stand with consistent Mod A in preparation for BADL tasks OT Short Term Goal 2 (Week 1): Pt will complete toilet transfers with consitent Mod A to reduce caregiver burden  OT Short Term Goal 3 (Week 1): Pt will tolerate standing at the sink for 1 minute to increase activity tolerance for BADL tasks  Skilled Therapeutic Interventions/Progress Updates:    1) Treatment session with focus on sequencing with bathing/dressing tasks, functional transfers, and dynamic standing balance.  Pt received in bed reporting fatigue.  Completed bed mobility with supervision and increased time.  Mod assist sit > stand from bed.  Pt incontinent of bowel in brief, ambulated to toilet with min assist with RW.  Max assist for toilet transfer requiring assist for lowering and then lifting assist post toileting.  Pt completed hygiene with increased time.  Ambulated to sink with RW and min assist.  Engaged in bathing and dressing at sink with pt asking "How do I do this?" and "What do I do next?" during bathing tasks.  Therapist provided mod cues for initiation and sequencing during bathing and dressing.  Pt demonstrating decreased mental flexibility with grooming tasks, however refusing to attempt brushing teeth in standing.  Pt passed off to PT.   Therapy Documentation Precautions:  Precautions Precautions: Fall Restrictions Weight Bearing Restrictions: No Pain: Pt with c/o pain in Lt leg/hip with bending  See Function Navigator for Current Functional Status.   Therapy/Group: Individual Therapy  [name] 03/20/2017, 10:48 AM

## 2017-03-21 ENCOUNTER — Inpatient Hospital Stay (HOSPITAL_COMMUNITY): Payer: PPO | Admitting: Physical Therapy

## 2017-03-21 ENCOUNTER — Inpatient Hospital Stay (HOSPITAL_COMMUNITY): Payer: PPO | Admitting: Occupational Therapy

## 2017-03-21 LAB — GLUCOSE, CAPILLARY
GLUCOSE-CAPILLARY: 134 mg/dL — AB (ref 65–99)
GLUCOSE-CAPILLARY: 143 mg/dL — AB (ref 65–99)
GLUCOSE-CAPILLARY: 158 mg/dL — AB (ref 65–99)
Glucose-Capillary: 163 mg/dL — ABNORMAL HIGH (ref 65–99)

## 2017-03-21 NOTE — Progress Notes (Signed)
Occupational Therapy Session Note  Patient Details  Name: Daniel [name] MRN: [id number] Date of Birth: [birth year]  Today's Date: 03/21/2017 OT Individual Time: 1400-1500 OT Individual Time Calculation (min): 60 min    Short Term Goals: Week 1:  OT Short Term Goal 1 (Week 1): Pt will complete Sit<>stand with consistent Mod A in preparation for BADL tasks OT Short Term Goal 2 (Week 1): Pt will complete toilet transfers with consitent Mod A to reduce caregiver burden  OT Short Term Goal 3 (Week 1): Pt will tolerate standing at the sink for 1 minute to increase activity tolerance for BADL tasks  Skilled Therapeutic Interventions/Progress Updates:    Pt greeted in bed and agreeable to OT with encouragement. Pt needed increased time and effort to come to sitting EOB with min A to advance LEs and mod A to elevate trunk from flat HOB. Stand-pivot to wc with mod A. Pt brought to [place] wing of hospital for change of scenery and in hopes that environmental change will be more motivating to pt. Pt completed 5 sit<>stands with Mod/max A with focus on anterior weight shift and power up. Pt tolerated standing for at least 1 minute on each stand with 2 stands tolerating 3 mins. Pt needed extended rest breaks after each stand. Pt returned to room at end of session and completed stand-pivot back to bed with mod A. Pt left semi-reclined in bed with bed alarm on and needs met.   Therapy Documentation Precautions:  Precautions Precautions: Fall Restrictions Weight Bearing Restrictions: No  See Function Navigator for Current Functional Status.   Therapy/Group: Individual Therapy  Valma Cava 03/21/2017, 3:02 PM

## 2017-03-21 NOTE — Progress Notes (Signed)
Physical Therapy Note  Patient Details  Name: Daniel [name] MRN: [id number] Date of Birth: September 11, 1935 Today's Date: 03/21/2017    Time: 1030-1100 30 minutes  1:1 No c/o pain. Pt c/o fatigue from previous 2 sessions but is willing to participate in therex seated in recliner.  Pt given HEP of supine/seated therex and pt performed while using HEP handout and pt was able to verbalize and demo understanding.  Pt then states he is fatigued and asks to go to bed. Pt requires max A to stand from recliner chair, pt requires min A to doff shoes and min A for sit to supine.  Pt left in bed with needs at hand.   Daniel [name] 03/21/2017, 11:42 AM

## 2017-03-21 NOTE — Progress Notes (Signed)
Occupational Therapy Session Note  Patient Details  Name: Daniel [name] MRN: [id number] Date of Birth: 1935-10-26  Today's Date: 03/21/2017 OT Individual Time: 0930-1030 OT Individual Time Calculation (min): 60 min    Short Term Goals: Week 1:  OT Short Term Goal 1 (Week 1): Pt will complete Sit<>stand with consistent Mod A in preparation for BADL tasks OT Short Term Goal 2 (Week 1): Pt will complete toilet transfers with consitent Mod A to reduce caregiver burden  OT Short Term Goal 3 (Week 1): Pt will tolerate standing at the sink for 1 minute to increase activity tolerance for BADL tasks  Skilled Therapeutic Interventions/Progress Updates:    Treatment session with focus on sit <> stand and standing tolerance during functional activities.  Pt received upright in recliner.  Engaged in meaning conversation to increase participation.  Focus on anterior weight shift for sit > stand with pt continuing to require mod-max assist for sit > stand.  Pt stood x3 for 5-6 mins each attempt with min guard for standing balance while engaging in table top activity.  Pt required prolonged rest breaks after each stand.  Returned to room and pt reports need to urinate.  Utilized urinal in standing with pt requiring mod assist sit > stand and pt was able to position and hold while maintaining balance with min guard.  Pt returned to sitting and passed off to PT.  Therapy Documentation Precautions:  Precautions Precautions: Fall Restrictions Weight Bearing Restrictions: No Pain: Pain Assessment Pain Score: 2   See Function Navigator for Current Functional Status.   Therapy/Group: Individual Therapy  [name] 03/21/2017, 10:48 AM

## 2017-03-21 NOTE — Progress Notes (Signed)
[facility]ICINE & REHABILITATION     PROGRESS NOTE  Subjective/Complaints:  Patient seen lying in bed this morning. He states he slept well overnight but is still sleepy this morning.  ROS: Denies CP, SOB, N/V/D.  Objective: Vital Signs: Blood pressure 135/65, pulse 63, temperature 97.8 F (36.6 C), resp. rate 18, weight (P) 75.4 kg (166 lb 3.6 oz), SpO2 98 %. No results found. Recent Labs    03/19/17 0806  WBC 9.3  HGB 13.9  HCT 43.1  PLT 318   Recent Labs    03/19/17 0806  NA 137  K 4.5  CL 99*  GLUCOSE 179*  BUN 23*  CREATININE 0.80  CALCIUM 10.0   CBG (last 3)  Recent Labs    03/20/17 1639 03/20/17 2104 03/21/17 0648  GLUCAP 146* 140* 134*    Wt Readings from Last 3 Encounters:  03/20/17 (P) 75.4 kg (166 lb 3.6 oz)  03/09/17 70.3 kg (155 lb)  11/16/16 70.3 kg (155 lb)    Physical Exam:  BP 135/65 (BP Location: Right Arm)   Pulse 63   Temp 97.8 F (36.6 C)   Resp 18   Wt (P) 75.4 kg (166 lb 3.6 oz)   SpO2 98%   BMI (P) 21.93 kg/m  Constitutional: He appears well-developed and well-nourished.  HENT: Bilateral burr hole sites c/d/i Eyes: EOM are normal. No discharge.  Cardiovascular: RRR. No JVD. Respiratory: Effort normal and breath sounds normal.  GI: Bowel sounds are normal. Nondistended Musculoskeletal: No edema or tenderness in extremities  Neurological: He is alert.  Makes good eye contact with examiner.  Follows simple commands.  Motor: B/l UE: 4/5  B/l LE: HF 4/5, KE 4/5, ADF/PF 4+/5 (unchanged) Skin: See above  Psychiatric: He has a normal mood and affect. His behavior is normal.   Assessment/Plan: 1. Functional deficits secondary to bilateral subdural hematomas with midline shift. Status post burr hole evacuation 03/09/2017 which require 3+ hours per day of interdisciplinary therapy in a comprehensive inpatient rehab setting. Physiatrist is providing close team supervision and 24 hour management of active medical  problems listed below. Physiatrist and rehab team continue to assess barriers to discharge/monitor patient progress toward functional and medical goals.  Function:  Bathing Bathing position   Position: Wheelchair/chair at sink  Bathing parts Body parts bathed by patient: Right arm, Left arm, Chest, Abdomen, Front perineal area, Right upper leg, Left upper leg Body parts bathed by helper: Buttocks, Right lower leg, Left lower leg, Back  Bathing assist Assist Level: Touching or steadying assistance(Pt > 75%)(Mod assist)      Upper Body Dressing/Undressing Upper body dressing   What is the patient wearing?: Pull over shirt/dress     Pull over shirt/dress - Perfomed by patient: Thread/unthread right sleeve, Thread/unthread left sleeve, Put head through opening Pull over shirt/dress - Perfomed by helper: Pull shirt over trunk        Upper body assist Assist Level: (Min assist)      Lower Body Dressing/Undressing Lower body dressing   What is the patient wearing?: Pants, Non-skid slipper socks     Pants- Performed by patient: Pull pants up/down, Thread/unthread right pants leg Pants- Performed by helper: Thread/unthread left pants leg   Non-skid slipper socks- Performed by helper: Don/doff right sock, Don/doff left sock       Shoes - Performed by helper: Don/doff right shoe, Don/doff left shoe          Lower body assist Assist for lower body  dressing: (Max assist)      Toileting Toileting   Toileting steps completed by patient: Adjust clothing after toileting, Performs perineal hygiene Toileting steps completed by helper: Adjust clothing prior to toileting Toileting Assistive Devices: Grab bar or rail  Toileting assist Assist level: Touching or steadying assistance (Pt.75%)   Transfers Chair/bed transfer   Chair/bed transfer method: Ambulatory Chair/bed transfer assist level: Maximal assist (Pt 25 - 49%/lift and lower) Chair/bed transfer assistive device: Environmental health practitioner     Max distance: 30 ft Assist level: Touching or steadying assistance (Pt > 75%)   Wheelchair          Cognition Comprehension Comprehension assist level: Understands basic 75 - 89% of the time/ requires cueing 10 - 24% of the time  Expression Expression assist level: Expresses basic 75 - 89% of the time/requires cueing 10 - 24% of the time. Needs helper to occlude trach/needs to repeat words.  Social Interaction Social Interaction assist level: Interacts appropriately 75 - 89% of the time - Needs redirection for appropriate language or to initiate interaction.  Problem Solving Problem solving assist level: Solves basic 50 - 74% of the time/requires cueing 25 - 49% of the time  Memory Memory assist level: Recognizes or recalls 50 - 74% of the time/requires cueing 25 - 49% of the time    Medical Problem List and Plan: 1.  Decreased functional mobility secondary to bilateral subdural hematomas with midline shift. Status post burr hole evacuation 03/09/2017   Cont CIR  2.  DVT Prophylaxis/Anticoagulation: SCDs. Patient is ambulatory 3. Pain Management: Lyrica 50 mg twice a day, Hydrocodone and Robaxin as needed 4. Mood/Alzheimer's. Exelon patch 9.5 mg daily 5. Neuropsych: This patient is not fully capable of making decisions on his own behalf. 6. Skin/Wound Care: Routine skin checks 7. Fluids/Electrolytes/Nutrition: Routine I&O's    BUN/CR trending up on 3/4    Encourage fluids 8. Seizure prophylaxis. Keppra 500 mg twice a day, d/ced on 3/4 9. Diabetes mellitus peripheral neuropathy. Hemoglobin A1c 6.9. Check blood sugars before meals and at bedtime. Diabetic teaching   Glucophage 500 mg twice a day, increased to 850 twice a day on 2/28, increased to 1000 BID on 3/4.   ? Slight improvement, will consider further medication changes tomorrow if necessary    CBG (last 3)  Recent Labs    03/20/17 1639 03/20/17 2104 03/21/17 0648  GLUCAP 146* 140* 134*    10. Hypertension. Altace 2.5 mg daily    Vitals:   03/20/17 1357 03/21/17 0200  BP: 122/62 135/65  Pulse: 73 63  Resp: 18 18  Temp: (!) 97.3 F (36.3 C) 97.8 F (36.6 C)  SpO2: 97% 98%    Controlled 3/6 11. Hyperlipidemia. Lipitor/Zetia 12. Constipation. Laxative assistance 13. Hypoalbuminemia   Supplement initiated on 2/27 14. ABLA: Resolved   Hb 13.9 on 3/4   Cont to monitor 15.  Urinary incont with urgency    Timed toileting   UA unremarkable  LOS (Days) 8 A FACE TO FACE EVALUATION WAS PERFORMED  Daniel [name] Lorie Phenix 03/21/2017 8:19 AM

## 2017-03-21 NOTE — Progress Notes (Signed)
Physical Therapy Session Note  Patient Details  Name: Daniel [name] MRN: [id number] Date of Birth: Apr 11, 1935  Today's Date: 03/21/2017 PT Individual Time: 0805-0900 PT Individual Time Calculation (min): 55 min   Short Term Goals: Week 2:  PT Short Term Goal 1 (Week 2): =LTG  Skilled Therapeutic Interventions/Progress Updates: Pt presented in bed with nsg present receiving meds. Pt c/o neuropathic pain in LE and muscular pain in HS/buttock region (premedicated). Pt noted to have soiled brief and wet sheets. Pt performed supine to sit with HOB elevated and bed rail. Performed with min guard and increased time. Pt required mod verbal cues for lateral wt shift to facilitate scooting to EOB with increased time. PTA threaded pants total assist for time management and pt performed sit to stand from elevated bed minA with RW while PTA performed peri-hygiene and changed brief. Pt required modA for pulling up pants with pt noted to have some increased posterior lean which required modA for correction. Performed ambulatory transfer to w/c with noted decreased eccentric control upon descent. Pt changed shirt with set up due to shirt being wet. Donned shoes maxA with use of shoe horn, increased assist possibly required due to use of grip socks vs regular socks. Pt ambulated 62f with RW and w/c follow requiring modA for sit to stand from w/c, noted with shortened step length and poor foot clearance. Pt propelled w/c 567fwith BUE for endurance and cardiovascular activity. Performed ambulatory transfer w/c to recliner requiring modA sit to stand from w/c and pt remained in w/c at end of session, positioned to comfort, with call bell within reach and current needs met.      Therapy Documentation Precautions:  Precautions Precautions: Fall Restrictions Weight Bearing Restrictions: No General: PT Amount of Missed Time (min): 45 Minutes PT Missed Treatment Reason: Patient fatigue Vital Signs:   Pain: Pain  Assessment Pain Score: 2    See Function Navigator for Current Functional Status.   Therapy/Group: Individual Therapy  Kimley Apsey  Ioanna Colquhoun, PTA  03/21/2017, 12:35 PM

## 2017-03-21 NOTE — Progress Notes (Signed)
Nutrition Follow-up  INTERVENTION:   - Continue Glucerna Shake PO TID, each supplement provides 220 kcal and 10 grams of protein - Continue Pro-Stat PO BID, each supplement provides 100 kcal and 15 grams protein - Continue MVI with minerals daily - Magic cup TID with meals, each supplement provides 290 kcal and 9 grams of protein - RD put note in [facility] for pt to receive a soup with lunch and dinner meals.  NUTRITION DIAGNOSIS:   Increased nutrient needs related to chronic illness, wound healing(Alzheimer's, surgical incisions, Stage 2 pressure injury) as evidenced by estimated needs.  Ongoing  GOAL:   Patient will meet greater than or equal to 90% of their needs  Improving  MONITOR:   PO intake, Supplement acceptance, Skin, Weight trends  REASON FOR ASSESSMENT:   Malnutrition Screening Tool   ASSESSMENT:   82 y.o male with PMH significant for Alzheimer's dementia, DM, HTN, diabetic neuropathy, and subdural hygroma that has been closely monitored since 11/2016. S/P evacuation of hygroma on 2/22.  Spoke with pt and family member at bedside. Pt reports that he still has a poor appetite. Per meal completion in chart, pt has consumed 25-100% of meals since 03/19/17. Pt has enjoyed Glucerna oral nutrition supplements and is interested in receiving Magic Cup with meals. RD to order via [facility] as Magic Cup is stocked on the unit. Pt is unsure whether he has been receiving Pro-Stat.  Pt's family member requested that RD order soups. Pt stated he would consume soups if present on his meal tray. RD will put note in [facility] for pt to receive soup with lunch and dinner meals.  Pt continues to deny N/V/D.  RD noted weight has increased since current admission. History of moderate PCM noted.  Medications reviewed and include: Oscal with vitamin D daily, 2000 units vitamin D daily, sliding scale Novolog, 1000 mg Metformin BID, MVI with minerals daily, 40 mg Protonix  daily, Senokot daily  Labs reviewed (03/19/17): chloride 99 (L), BUN 23 (H), hemoglobin A1C 6.9 on 09/06/17 CBG's: 143, 134, 140, 146, 159, 138 since 03/20/17  Diet Order:  Diet regular Room service appropriate? Yes; Fluid consistency: Thin  EDUCATION NEEDS:   No education needs have been identified at this time  Skin:  Skin Assessment: Skin Integrity Issues: Skin Integrity Issues:: Stage II, Incisions Stage II: buttocks Incisions: x2  Last BM:  03/21/17 large type 6  Height:   Ht Readings from Last 1 Encounters:  03/09/17 6\' 1"  (1.854 m)    Weight:   Wt Readings from Last 1 Encounters:  03/20/17 166 lb 3.6 oz (75.4 kg)    Ideal Body Weight:  83.6 kg  BMI:  Body mass index is 21.93 kg/m.  Estimated Nutritional Needs:   Kcal:  2100-2300  Protein:  105-115g  Fluid:  >2.1L    Gaynell Face, MS, RD, LDN Pager: [phone] Weekend/After Hours: [phone]

## 2017-03-22 ENCOUNTER — Inpatient Hospital Stay (HOSPITAL_COMMUNITY): Payer: PPO | Admitting: Occupational Therapy

## 2017-03-22 ENCOUNTER — Inpatient Hospital Stay (HOSPITAL_COMMUNITY): Payer: PPO

## 2017-03-22 ENCOUNTER — Inpatient Hospital Stay (HOSPITAL_COMMUNITY): Payer: PPO | Admitting: Physical Therapy

## 2017-03-22 DIAGNOSIS — F0390 Unspecified dementia without behavioral disturbance: Secondary | ICD-10-CM

## 2017-03-22 LAB — GLUCOSE, CAPILLARY
GLUCOSE-CAPILLARY: 106 mg/dL — AB (ref 65–99)
GLUCOSE-CAPILLARY: 134 mg/dL — AB (ref 65–99)
Glucose-Capillary: 149 mg/dL — ABNORMAL HIGH (ref 65–99)
Glucose-Capillary: 146 mg/dL — ABNORMAL HIGH (ref 65–99)

## 2017-03-22 MED ORDER — GLIMEPIRIDE 1 MG PO TABS
1.0000 mg | ORAL_TABLET | Freq: Every day | ORAL | Status: DC
  Administered 2017-03-22 – 2017-03-24 (×3): 1 mg via ORAL
  Filled 2017-03-22 (×3): qty 1

## 2017-03-22 MED ORDER — TAMSULOSIN HCL 0.4 MG PO CAPS
0.4000 mg | ORAL_CAPSULE | Freq: Every day | ORAL | Status: DC
  Administered 2017-03-22 – 2017-03-28 (×7): 0.4 mg via ORAL
  Filled 2017-03-22 (×7): qty 1

## 2017-03-22 NOTE — Patient Care Conference (Signed)
Inpatient RehabilitationTeam Conference and Plan of Care Update Date: 03/21/2017   Time: 2:35 PM    Patient Name: Daniel [name]      Medical Record Number: [id number]  Date of Birth: 1935/02/05 Sex: Male         Room/Bed: 4M03C/4M03C-01 Payor Info: Payor: [employer] / Plan: [employer] / Product Type: *No Product type* /    Admitting Diagnosis: SDH  Admit Date/Time:  03/13/2017  8:09 PM Admission Comments: No comment available   Primary Diagnosis:  <principal problem not specified> Principal Problem: <principal problem not specified>  Patient Active Problem List   Diagnosis Date Noted  . Dementia without behavioral disturbance   . Abnormal BUN-to-creatinine ratio   . Urinary incontinence   . Labile blood glucose   . Type 2 diabetes mellitus with peripheral neuropathy (HCC)   . Subdural hematoma ([place]) 03/13/2017  . Encephalopathy   . Seizure prophylaxis   . Hyperlipidemia   . Slow transit constipation   . Steroid-induced hyperglycemia   . Diabetes mellitus type 2 in nonobese (HCC)   . Tachypnea   . Malnutrition of moderate degree 03/08/2017  . 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 Urinary frequency 11/16/2016  . Postprocedural hypotension   . Dural tear   . Alzheimer's dementia without behavioral disturbance   . Chronic back pain   . Post-operative pain   . Benign essential HTN   . Leukocytosis   . Acute blood loss anemia   . Thrombocytopenia ([place])   . Spondylolisthesis of lumbar region 09/12/2016  . Essential hypertension 09/12/2016  . Surgery, elective   . Diarrhea 06/08/2016  . Memory loss 03/09/2016  . Essential tremor 03/09/2016  . Diabetic neuropathy ([place]) 03/09/2016  . Vitamin D deficiency 03/09/2016    Expected Discharge Date: Expected Discharge Date: 04/04/17  Team Members Present: Physician leading conference: Dr. Delice Lesch Social Worker Present: Lennart Pall, LCSW Nurse Present: Rayetta Humphrey, RN PT Present: Roderic Ovens, PT OT Present:  [name], OT SLP Present: Weston Anna, SLP PPS Coordinator present : Daiva Nakayama, RN, CRRN     Current Status/Progress Goal Weekly Team Focus  Medical   Decreased functional mobility secondary to bilateral subdural hematomas with midline shift. Status post burr hole evacuation 03/09/2017  Improve mobility, cognition, BP, urinary incontinence, DM  See above   Bowel/Bladder   has had multiple incontinent episode of both in the last 2 nights, can not hold urine to pull his brief back & cannot hold the urinal himself without accidents, urgency, LBM 03/20/17, loose X2, total assist  mod assist  timed toileting   Swallow/Nutrition/ Hydration             ADL's   Mod-Max A sit > stand, Max assist LB dressing, Mod assist bathing, Min assist UB dressing  Supervision overall, may downgrade dynamic standing to min assist  ADL retraining, sit <> stand, dynamic standing balance, activity tolerance   Mobility   mod/max A with transfers, min A gait and stairs  downgraded to min A  activity tolerance, sit <> stand, family ed   Communication             Safety/Cognition/ Behavioral Observations            Pain   no c/o pain during shift, has tylenol, norco & robaxin prn  pain scale <3  assess & treat as needed   Skin   stage II to left buttock, bil incisions with staples to the scalp  No further skin  breakdown or infection with mod assist.  assess skin q shift    Rehab Goals Patient on target to meet rehab goals: No *See Care Plan and progress notes for long and short-term goals.     Barriers to Discharge  Current Status/Progress Possible Resolutions Date Resolved   Physician    Medical stability;Other (comments)  Cognition  See above  Therapies, optimize DM/HTN meds, alzheimer's meds, workup for incontinence      Nursing  Wound Care;Incontinence               PT                    OT                  SLP                SW Decreased caregiver support;Home environment  access/layout Anticipate wife will change plan to SNF following team conference.            Discharge Planning/Teaching Needs:  Pt making very limited progress and wife reports that she feels he may need to d/c to SNF as he will likely not reach initially targeted goals of supervision      Team Discussion:  MD monitoring DM, BP and encourage fluids;  incont b/b.  MD recommends timed toileting and to check PVRs.  Team reports minimal change/ improvement this week in therapies.  Still mod -max assist with mobility and ADLs.  PT goals being downgraded to min assist.  Poor participation and poor carryover.  SW anticipates wife will change plan to SNF.  Revisions to Treatment Plan:  Several goals downgraded;  Likely change of d/c plan to SNF.    Continued Need for Acute Rehabilitation Level of Care: The patient requires daily medical management by a physician with specialized training in physical medicine and rehabilitation for the following conditions: Daily direction of a multidisciplinary physical rehabilitation program to ensure safe treatment while eliciting the highest outcome that is of practical value to the patient.: Yes Daily medical management of patient stability for increased activity during participation in an intensive rehabilitation regime.: Yes Daily analysis of laboratory values and/or radiology reports with any subsequent need for medication adjustment of medical intervention for : Neurological problems;Mood/behavior problems;Blood pressure problems;Diabetes problems  Korry Dalgleish 03/22/2017, 3:32 PM

## 2017-03-22 NOTE — Progress Notes (Signed)
[place] PHYSICAL MEDICINE & REHABILITATION     PROGRESS NOTE  Subjective/Complaints:  Patient seen lying in bed this morning. He states he slept well overnight.  ROS: Denies CP, SOB, N/V/D.  Objective: Vital Signs: Blood pressure 116/63, pulse 69, temperature 98.2 F (36.8 C), temperature source Oral, resp. rate 17, weight 75.4 kg (166 lb 3.6 oz), SpO2 98 %. No results found. No results for input(s): WBC, HGB, HCT, PLT in the last 72 hours. No results for input(s): NA, K, CL, GLUCOSE, BUN, CREATININE, CALCIUM in the last 72 hours.  Invalid input(s): CO CBG (last 3)  Recent Labs    03/21/17 1627 03/21/17 2115 03/22/17 0632  GLUCAP 163* 158* 149*    Wt Readings from Last 3 Encounters:  03/20/17 75.4 kg (166 lb 3.6 oz)  03/09/17 70.3 kg (155 lb)  11/16/16 70.3 kg (155 lb)    Physical Exam:  BP 116/63 (BP Location: Right Arm)   Pulse 69   Temp 98.2 F (36.8 C) (Oral)   Resp 17   Wt 75.4 kg (166 lb 3.6 oz)   SpO2 98%   BMI 21.93 kg/m  Constitutional: He appears well-developed and well-nourished.  HENT: Bilateral burr hole sites c/d/i Eyes: EOM are normal. No discharge.  Cardiovascular: RRR. No JVD. Respiratory: Effort normal and breath sounds normal.  GI: Bowel sounds are normal. Nondistended Musculoskeletal: No edema or tenderness in extremities  Neurological: He is alert.  Makes good eye contact with examiner.  Follows simple commands.  Motor: B/l UE: 4/5  B/l LE: HF 4/5, KE 4/5, ADF/PF 4+/5 (stable) Skin: See above  Psychiatric: He has a normal mood and affect. His behavior is normal.   Assessment/Plan: 1. Functional deficits secondary to bilateral subdural hematomas with midline shift. Status post burr hole evacuation 03/09/2017 which require 3+ hours per day of interdisciplinary therapy in a comprehensive inpatient rehab setting. Physiatrist is providing close team supervision and 24 hour management of active medical problems listed below. Physiatrist  and rehab team continue to assess barriers to discharge/monitor patient progress toward functional and medical goals.  Function:  Bathing Bathing position   Position: Wheelchair/chair at sink  Bathing parts Body parts bathed by patient: Right arm, Left arm, Chest, Abdomen, Front perineal area, Right upper leg, Left upper leg Body parts bathed by helper: Buttocks, Right lower leg, Left lower leg, Back  Bathing assist Assist Level: Touching or steadying assistance(Pt > 75%)(Mod assist)      Upper Body Dressing/Undressing Upper body dressing   What is the patient wearing?: Pull over shirt/dress     Pull over shirt/dress - Perfomed by patient: Thread/unthread right sleeve, Thread/unthread left sleeve, Put head through opening Pull over shirt/dress - Perfomed by helper: Pull shirt over trunk        Upper body assist Assist Level: (Min assist)      Lower Body Dressing/Undressing Lower body dressing   What is the patient wearing?: Pants, Non-skid slipper socks     Pants- Performed by patient: Pull pants up/down, Thread/unthread right pants leg Pants- Performed by helper: Thread/unthread left pants leg   Non-skid slipper socks- Performed by helper: Don/doff right sock, Don/doff left sock       Shoes - Performed by helper: Don/doff right shoe, Don/doff left shoe          Lower body assist Assist for lower body dressing: (Max assist)      Toileting Toileting   Toileting steps completed by patient: Adjust clothing after toileting Toileting steps completed  by helper: Adjust clothing prior to toileting Toileting Assistive Devices: Grab bar or rail  Toileting assist Assist level: Touching or steadying assistance (Pt.75%)   Transfers Chair/bed transfer   Chair/bed transfer method: Ambulatory Chair/bed transfer assist level: Maximal assist (Pt 25 - 49%/lift and lower) Chair/bed transfer assistive device: Medical sales representative     Max distance: 30 ft Assist  level: Touching or steadying assistance (Pt > 75%)   Wheelchair          Cognition Comprehension Comprehension assist level: Understands basic 75 - 89% of the time/ requires cueing 10 - 24% of the time  Expression Expression assist level: Expresses basic 75 - 89% of the time/requires cueing 10 - 24% of the time. Needs helper to occlude trach/needs to repeat words.  Social Interaction Social Interaction assist level: Interacts appropriately 75 - 89% of the time - Needs redirection for appropriate language or to initiate interaction.  Problem Solving Problem solving assist level: Solves basic 50 - 74% of the time/requires cueing 25 - 49% of the time  Memory Memory assist level: Recognizes or recalls 50 - 74% of the time/requires cueing 25 - 49% of the time    Medical Problem List and Plan: 1.  Decreased functional mobility secondary to bilateral subdural hematomas with midline shift. Status post burr hole evacuation 03/09/2017   Cont CIR  2.  DVT Prophylaxis/Anticoagulation: SCDs. Patient is ambulatory 3. Pain Management: Lyrica 50 mg twice a day, Hydrocodone and Robaxin as needed 4. Mood/Alzheimer's. Exelon patch 9.5 mg daily 5. Neuropsych: This patient is not fully capable of making decisions on his own behalf. 6. Skin/Wound Care: Routine skin checks 7. Fluids/Electrolytes/Nutrition: Routine I&O's    BUN/CR trending up on 3/4    Encourage fluids 8. Seizure prophylaxis. Keppra 500 mg twice a day, d/ced on 3/4 9. Diabetes mellitus peripheral neuropathy. Hemoglobin A1c 6.9. Check blood sugars before meals and at bedtime. Diabetic teaching   Glucophage 500 mg twice a day, increased to 850 twice a day on 2/28, increased to 1000 BID on 3/4.   Amaryl 1 mg started on 2/7    CBG (last 3)  Recent Labs    03/21/17 1627 03/21/17 2115 03/22/17 0632  GLUCAP 163* 158* 149*   10. Hypertension. Altace 2.5 mg daily    Vitals:   03/21/17 0200 03/21/17 1550  BP: 135/65 116/63  Pulse: 63 69   Resp: 18 17  Temp: 97.8 F (36.6 C) 98.2 F (36.8 C)  SpO2: 98% 98%    Controlled 3/7 11. Hyperlipidemia. Lipitor/Zetia 12. Constipation. Laxative assistance 13. Hypoalbuminemia   Supplement initiated on 2/27 14. ABLA: Resolved   Hb 13.9 on 3/4   Cont to monitor 15.  Urinary incont with urgency    Timed toileting  PVR indicating retention  Flomax started on 2/7   UA unremarkable  LOS (Days) 9 A FACE TO FACE EVALUATION WAS PERFORMED  Ankit Lorie Phenix 03/22/2017 8:11 AM

## 2017-03-22 NOTE — Progress Notes (Signed)
Physical Therapy Session Note  Patient Details  Name: Daniel [name] MRN: [id number] Date of Birth: Jun 09, 1935  Today's Date: 03/22/2017 PT Individual Time: 1115-1200 PT Individual Time Calculation (min): 45 min   Short Term Goals: Week 2:  PT Short Term Goal 1 (Week 2): =LTG  Skilled Therapeutic Interventions/Progress Updates:    Handoff from OT. Total assist to transport to gym for time management. Transferred to Nustep with mod assist for sit -> stand with focus on technique just practiced with OT (less lifting assist needed than in past) with facilitation for anterior weighshift and upright posture. Nustep for reciprocal movement pattern retraining through BUE and BLE to help with carryover to gait and general strengthening and endurance x 8 min total (4 min and 4 min) requiring break to perform urination with urinal with assist needed. Repeated sit <> stand re-training throughout session with focus on foot placement (widening BOS) and pushing up through BUE and then head upright for transitional movement improving to min to mod assist for sit <> stands including transfer back to recliner at end of session. Gait x 30' with min assist for balance and max cues for positioning of RW and tactile and verbal cues for upright posture. Pt maintains flexed posture and shuffled gait pattern. When counting steps outloud by PT, pt able to increase step length and maintain more fluid gait pattern. Functional UE strengthening task to propel w/c x 50' in hallway with focus on not veering to the L. Positioned in recliner for lunch at end of session.   Therapy Documentation Precautions:  Precautions Precautions: Fall Restrictions Weight Bearing Restrictions: No    Pain: Denies pain. Reports legs feel so heavy.    See Function Navigator for Current Functional Status.   Therapy/Group: Individual Therapy  Canary Brim Ivory Broad, PT, DPT  03/22/2017, 12:09 PM

## 2017-03-22 NOTE — Progress Notes (Signed)
Social Work Patient ID: Daniel [name], male   DOB: November 27, 1935, 82 y.o.   MRN: [id number]   Met with pt and wife yesterday afternoon following team conference.  Discussed limited improvements this week, currently level of function and most goals being downgraded to min assist overall.  Wife notes that she also feels there has been limited change and states, "we're just going to have to go to a skilled nursing center again..."  We discussed local facilities.  I also explained that I need to speak with insurance to see if they will consider SNF coverage again.  Wife notes that they do have a long-term care policy and, if health insurance won't cover, they can pay privately until that policy would begin.  Will begin SNF bed search and keep tx team posted on progress.  Takeysha Bonk, LCSW

## 2017-03-22 NOTE — Progress Notes (Signed)
Occupational Therapy Note  Patient Details  Name: Daniel [name] MRN: [id number] Date of Birth: November 16, 1935  Today's Date: 03/22/2017 OT Individual Time: 7591-6384 OT Individual Time Calculation (min): 40 min   No c/o pain  Pt seen this session to focus on safe and effective sit to stand movement patterns.  Pt received sitting in recliner and he needed cues to scoot hips closer to edge of seat, foot placement, hand placement and forward lean with trunk elongation as he would maintain a hunched posture.  Pt worked with cues on technique.  He then used RW to ambulate to sink with cues for walker placement. He stood at sink for several minutes to work on grooming and then needed to sit and rest.  Worked on sit to stand from w/c level.  Demonstrated and explained to pt purpose of these techniques.  Pt's PT arrived for his next session.     [place] 03/22/2017, 12:27 PM

## 2017-03-22 NOTE — Progress Notes (Signed)
Occupational Therapy Weekly Progress Note  Patient Details  Name: Daniel [name] MRN: [id number] Date of Birth: 1935/08/18  Beginning of progress report period: March 14, 2017 End of progress report period: March 22, 2017  Today's Date: 03/22/2017 OT Individual Time: 1330-1430 OT Individual Time Calculation (min): 60 min    Patient has met 1 of 3 short term goals.  Pt is making slow progress towards goals limited by decreased participation secondary to fatigue and cognitive impairments.  Pt currently requires mod-max assist for sit <> stand due to weakness and decreased anterior weight shift as well as decreased ability to recall new information.  Once upright pt requires min assist for mobility and min guard for standing balance. Pt demonstrating decreased sequencing with self-care tasks, question due to Alzheimers vs different routine as not cleared medically to bathe at shower level.  Pt with limited motivation to participate in therapy sessions due to fatigue, requiring max encouragement.   Patient continues to demonstrate the following deficits: muscle weakness, decreased coordination and decreased motor planning, decreased attention and decreased memory and decreased sitting balance, decreased standing balance, decreased postural control and decreased balance strategies and therefore will continue to benefit from skilled OT intervention to enhance overall performance with BADL and Reduce care partner burden.  Patient not progressing toward long term goals.  See goal revision..  Plan of care revisions: Downgraded bathing, LB dressing, toileting, and dynamic standing balance to min assist.  OT Short Term Goals Week 1:  OT Short Term Goal 1 (Week 1): Pt will complete Sit<>stand with consistent Mod A in preparation for BADL tasks OT Short Term Goal 1 - Progress (Week 1): Progressing toward goal OT Short Term Goal 2 (Week 1): Pt will complete toilet transfers with consitent Mod A to reduce  caregiver burden  OT Short Term Goal 2 - Progress (Week 1): Progressing toward goal OT Short Term Goal 3 (Week 1): Pt will tolerate standing at the sink for 1 minute to increase activity tolerance for BADL tasks OT Short Term Goal 3 - Progress (Week 1): Met Week 2:  OT Short Term Goal 1 (Week 2): Pt will complete Sit<>stand with consistent Mod A in preparation for BADL tasks OT Short Term Goal 2 (Week 2): Pt will complete toilet transfers to 3 in 1 over toilet with Mod A to reduce caregiver burden  OT Short Term Goal 3 (Week 2): Pt will complete LB dressing with Mod A and Mod cues for sequencing  Skilled Therapeutic Interventions/Progress Updates:    Treatment session with focus on sit > stand, standing balance, and functional mobility.  Pt received upright in recliner reporting fatigue but willing to engage in therapy session.  Mod assist sit > stand throughout session with increased time and multimodal cues for anterior weight shift. Ambulated 62' with RW with increased time and cues for safety with RW placement.  Engaged in standing activity at high-low table with focus on upright standing posture and increased standing tolerance.  Pt stood for 7 mins and 5 mins requiring seated rest break between.  Returned to room and transferred back to bed with mod assist stand pivot.  Left semi-reclined in bed with all needs in reach.  Therapy Documentation Precautions:  Precautions Precautions: Fall Restrictions Weight Bearing Restrictions: No Pain:  Pt with no c/o pain  See Function Navigator for Current Functional Status.   Therapy/Group: Individual Therapy  [name] 03/22/2017, 8:38 AM

## 2017-03-22 NOTE — Progress Notes (Signed)
Physical Therapy Session Note  Patient Details  Name: Daniel [name] MRN: [id number] Date of Birth: 1935/04/13  Today's Date: 03/22/2017 PT Individual Time: 0915-1000 PT Individual Time Calculation (min): 45 min   Short Term Goals: Week 2:  PT Short Term Goal 1 (Week 2): =LTG  Skilled Therapeutic Interventions/Progress Updates:    Pt supine in bed, agreeable to participate in therapy session. Supine to sit with HOB elevated and use of bedrails, SBA with increased time and cues needed. Assisted pt with donning pants and shoes. Sit to stand Mod Assist to RW, stand pivot transfer bed to w/c with Min A and R. Ambulation x 40 ft with RW and Min Assist, decreased B step length, flexed trunk, and verbal cues needed to keep RW closer to body for increased safety and balance. Ascend/descend 4 stairs with left handrail laterally, Min Assist, v/c for step-to gait pattern. Manual w/c propulsion x 50 ft with Min Assist to avoid obstacles on the left. Pt left seated in recliner in room with needs in reach and QRB in place.  Therapy Documentation Precautions:  Precautions Precautions: Fall Restrictions Weight Bearing Restrictions: No  See Function Navigator for Current Functional Status.   Therapy/Group: Individual Therapy  Excell Seltzer, PT, DPT  03/22/2017, 4:05 PM

## 2017-03-23 ENCOUNTER — Inpatient Hospital Stay (HOSPITAL_COMMUNITY): Payer: PPO | Admitting: Occupational Therapy

## 2017-03-23 ENCOUNTER — Inpatient Hospital Stay (HOSPITAL_COMMUNITY): Payer: PPO

## 2017-03-23 ENCOUNTER — Inpatient Hospital Stay (HOSPITAL_COMMUNITY): Payer: PPO | Admitting: Physical Therapy

## 2017-03-23 LAB — GLUCOSE, CAPILLARY
Glucose-Capillary: 113 mg/dL — ABNORMAL HIGH (ref 65–99)
Glucose-Capillary: 147 mg/dL — ABNORMAL HIGH (ref 65–99)
Glucose-Capillary: 139 mg/dL — ABNORMAL HIGH (ref 65–99)
Glucose-Capillary: 143 mg/dL — ABNORMAL HIGH (ref 65–99)

## 2017-03-23 NOTE — Progress Notes (Signed)
[facility] PHYSICAL MEDICINE & REHABILITATION     PROGRESS NOTE  Subjective/Complaints:  [name] seen lying in bed this morning. Daniel [name] states Daniel [name] slept well overnight. Daniel [name] states Daniel [name] is still sleepy this morning.  ROS: Denies CP, SOB, N/V/D.  Objective: Vital Signs: Blood pressure 134/66, pulse 66, temperature 97.6 F (36.4 C), temperature source Oral, resp. rate 16, weight 75.4 kg (166 lb 3.6 oz), SpO2 98 %. No results found. No results for input(s): WBC, HGB, HCT, PLT in the last 72 hours. No results for input(s): NA, K, CL, GLUCOSE, BUN, CREATININE, CALCIUM in the last 72 hours.  Invalid input(s): CO CBG (last 3)  Recent Labs    03/22/17 1644 03/22/17 2217 03/23/17 0659  GLUCAP 134* 146* 113*    Wt Readings from Last 3 Encounters:  03/20/17 75.4 kg (166 lb 3.6 oz)  03/09/17 70.3 kg (155 lb)  11/16/16 70.3 kg (155 lb)    Physical Exam:  BP 134/66 (BP Location: Right Arm)   Pulse 66   Temp 97.6 F (36.4 C) (Oral)   Resp 16   Wt 75.4 kg (166 lb 3.6 oz)   SpO2 98%   BMI 21.93 kg/m  Constitutional: Daniel [name] appears well-developed and well-nourished.  HENT: Bilateral burr hole sites c/d/i Eyes: EOM are normal. No discharge.  Cardiovascular: RRR. No JVD. Respiratory: Effort normal and breath sounds normal.  GI: Bowel sounds are normal. Nondistended Musculoskeletal: No edema or tenderness in extremities  Neurological: Daniel [name] is alert.  Makes good eye contact with examiner.  Follows simple commands.  Motor: B/l UE: 4/5  B/l LE: HF 4/5, KE 4/5, ADF/PF 4+/5 (unchanged) Skin: See above  Psychiatric: Daniel [name] has a normal mood and affect. Daniel [name] behavior is normal.   Assessment/Plan: 1. Functional deficits secondary to bilateral subdural hematomas with midline shift. Status post burr hole evacuation 03/09/2017 which require 3+ hours per day of interdisciplinary therapy in a comprehensive inpatient rehab setting. Physiatrist is providing close team supervision and 24 hour management of active  medical problems listed below. Physiatrist and rehab team continue to assess barriers to discharge/monitor [name] progress toward functional and medical goals.  Function:  Bathing Bathing position   Position: Wheelchair/chair at sink  Bathing parts Body parts bathed by [name]: Right arm, Left arm, Chest, Abdomen, Front perineal area, Right upper leg, Left upper leg Body parts bathed by helper: Buttocks, Right lower leg, Left lower leg, Back  Bathing assist Assist Level: Touching or steadying assistance(Pt > 75%)(Mod assist)      Upper Body Dressing/Undressing Upper body dressing   What is the [name] wearing?: Pull over shirt/dress     Pull over shirt/dress - Perfomed by [name]: Thread/unthread right sleeve, Thread/unthread left sleeve, Put head through opening Pull over shirt/dress - Perfomed by helper: Pull shirt over trunk        Upper body assist Assist Level: (Min assist)      Lower Body Dressing/Undressing Lower body dressing   What is the [name] wearing?: Pants, Non-skid slipper socks     Pants- Performed by [name]: Pull pants up/down, Thread/unthread right pants leg Pants- Performed by helper: Thread/unthread left pants leg   Non-skid slipper socks- Performed by helper: Don/doff right sock, Don/doff left sock       Shoes - Performed by helper: Don/doff right shoe, Don/doff left shoe          Lower body assist Assist for lower body dressing: (Max assist)      Toileting Toileting   Toileting steps completed by  [name]: Adjust clothing prior to toileting, Performs perineal hygiene, Adjust clothing after toileting Toileting steps completed by helper: Adjust clothing prior to toileting, Performs perineal hygiene Toileting Assistive Devices: Grab bar or rail  Toileting assist Assist level: Supervision or verbal cues   Transfers Chair/bed transfer   Chair/bed transfer method: Stand pivot Chair/bed transfer assist level: Touching or steadying assistance  (Pt > 75%) Chair/bed transfer assistive device: Armrests, Medical sales representative     Max distance: 40 ft Assist level: Touching or steadying assistance (Pt > 75%)   Wheelchair   Type: Manual Max wheelchair distance: 50 ft Assist Level: Touching or steadying assistance (Pt > 75%)  Cognition Comprehension Comprehension assist level: Understands basic 90% of the time/cues < 10% of the time  Expression Expression assist level: Expresses basic 75 - 89% of the time/requires cueing 10 - 24% of the time. Needs helper to occlude trach/needs to repeat words.  Social Interaction Social Interaction assist level: Interacts appropriately 75 - 89% of the time - Needs redirection for appropriate language or to initiate interaction.  Problem Solving Problem solving assist level: Solves basic 50 - 74% of the time/requires cueing 25 - 49% of the time  Memory Memory assist level: Recognizes or recalls 50 - 74% of the time/requires cueing 25 - 49% of the time    Medical Problem List and Plan: 1.  Decreased functional mobility secondary to bilateral subdural hematomas with midline shift. Status post burr hole evacuation 03/09/2017   Cont CIR  2.  DVT Prophylaxis/Anticoagulation: SCDs. [name] is ambulatory 3. Pain Management: Lyrica 50 mg twice a day, Hydrocodone and Robaxin as needed 4. Mood/Alzheimer's. Exelon patch 9.5 mg daily 5. Neuropsych: This [name] is not fully capable of making decisions on Daniel [name] own behalf. 6. Skin/Wound Care: Routine skin checks 7. Fluids/Electrolytes/Nutrition: Routine I&O's    BUN/CR trending up on 3/4    Encourage fluids 8. Seizure prophylaxis. Keppra 500 mg twice a day, d/ced on 3/4 9. Diabetes mellitus peripheral neuropathy. Hemoglobin A1c 6.9. Check blood sugars before meals and at bedtime. Diabetic teaching   Glucophage 500 mg twice a day, increased to 850 twice a day on 2/28, increased to 1000 BID on 3/4.   Amaryl 1 mg started on 3/7   Improving on 3/8     CBG (last 3)  Recent Labs    03/22/17 1644 03/22/17 2217 03/23/17 0659  GLUCAP 134* 146* 113*   10. Hypertension. Altace 2.5 mg daily    Vitals:   03/22/17 1452 03/23/17 0546  BP: 101/64 134/66  Pulse: 88 66  Resp: 20 16  Temp: 98.1 F (36.7 C) 97.6 F (36.4 C)  SpO2: 98% 98%    Controlled 3/8 11. Hyperlipidemia. Lipitor/Zetia 12. Constipation. Laxative assistance 13. Hypoalbuminemia   Supplement initiated on 2/27 14. ABLA: Resolved   Hb 13.9 on 3/4   Cont to monitor 15.  Urinary incont with urgency    Timed toileting  Flomax started on 2/7   UA unremarkable  PVRs ?improving  LOS (Days) 10 A FACE TO FACE EVALUATION WAS PERFORMED  Daniel [name] Lorie Phenix 03/23/2017 8:35 AM

## 2017-03-23 NOTE — Progress Notes (Signed)
Occupational Therapy Session Note  Patient Details  Name: Daniel [name] MRN: [id number] Date of Birth: 07/21/1935  Today's Date: 03/23/2017 OT Individual Time: 1500-1600 OT Individual Time Calculation (min): 60 min    Short Term Goals: Week 2:  OT Short Term Goal 1 (Week 2): Pt will complete Sit<>stand with consistent Mod A in preparation for BADL tasks OT Short Term Goal 2 (Week 2): Pt will complete toilet transfers to 3 in 1 over toilet with Mod A to reduce caregiver burden  OT Short Term Goal 3 (Week 2): Pt will complete LB dressing with Mod A and Mod cues for sequencing  Skilled Therapeutic Interventions/Progress Updates:    1:1. Pt asleep upon arrival but easily aroused. Pt requires min A-mod lifting A for all sit to stand/stand pivot transfers throughout session with tactile cues for weight shift forward. Pt propels w/c to dayroom with increased time and VC for steering to improve BUE strength and endurance required for BADLs. Pt completes seated pipe tree activity with min question cues to notice errors in figure based on picture. Pt stands to complete folding 6 towels at high low table with touching A for sitting balance once up in standing. Exited session with pt seated in bed, exit alarm on, call light in reach and all needs met.  Therapy Documentation Precautions:  Precautions Precautions: Fall Restrictions Weight Bearing Restrictions: No  See Function Navigator for Current Functional Status.   Therapy/Group: Individual Therapy   M  03/23/2017, 4:10 PM 

## 2017-03-23 NOTE — Progress Notes (Signed)
Occupational Therapy Session Note  Patient Details  Name: Daniel [name] MRN: [id number] Date of Birth: 1935-12-15  Today's Date: 03/23/2017 OT Individual Time: 1003-1045 OT Individual Time Calculation (min): 42 min    Short Term Goals: Week 2:  OT Short Term Goal 1 (Week 2): Pt will complete Sit<>stand with consistent Mod A in preparation for BADL tasks OT Short Term Goal 2 (Week 2): Pt will complete toilet transfers to 3 in 1 over toilet with Mod A to reduce caregiver burden  OT Short Term Goal 3 (Week 2): Pt will complete LB dressing with Mod A and Mod cues for sequencing  Skilled Therapeutic Interventions/Progress Updates:    1:1. Pt direct handoff at sink from previous OT. Pt incontinent of bladder in brief and stands to wash peri.buttocks with steadying A and Vc for hand placement. Pt completes bathing at sit to stand level with A to wash B feet. Pt dons pull over shirt with supervision and pants with touching A for advancing pants past hips. Pt stands at sink with touching A fading to supervision/VC for L knee extension to shave with intermittant UE support for balacne. Exited session with pt seated in reclinerl, call light in reach and QRB donned  Therapy Documentation Precautions:  Precautions Precautions: Fall Restrictions Weight Bearing Restrictions: No  See Function Navigator for Current Functional Status.   Therapy/Group: Individual Therapy  [name] 03/23/2017, 10:45 AM

## 2017-03-23 NOTE — Progress Notes (Signed)
Social Work Patient ID: Daniel [name], male   DOB: 1935-05-02, 82 y.o.   MRN: [id number]   Have spoken with CM at [facility], and discussed pt/family desire to pursue SNF placement.  She will present this information to the medical director and contact me if a MD peer-to-peer is needed or if they will go ahead and approve SNF coverage.  Will prepare FL2 and await info from insurance.  Zackaria Burkey, LCSW

## 2017-03-23 NOTE — Progress Notes (Signed)
Occupational Therapy Session Note  Patient Details  Name: Daniel [name] MRN: [id number] Date of Birth: 1935-05-28  Today's Date: 03/23/2017 OT Individual Time: 0935-1003 OT Individual Time Calculation (min): 28 min    Short Term Goals: Week 2:  OT Short Term Goal 1 (Week 2): Pt will complete Sit<>stand with consistent Mod A in preparation for BADL tasks OT Short Term Goal 2 (Week 2): Pt will complete toilet transfers to 3 in 1 over toilet with Mod A to reduce caregiver burden  OT Short Term Goal 3 (Week 2): Pt will complete LB dressing with Mod A and Mod cues for sequencing  Skilled Therapeutic Interventions/Progress Updates:    Pt completed transfer from supine to sit EOB with min assist.  He was then able to complete stand pivot transfer from the EOB with mod assist and mod demonstrational cueing for hand placement, to the wheelchair.  He reported the need to use the toilet, so pushed wheelchair in the bathroom and had pt complete stand pivot transfer to the elevated toilet with grab bars and min assist.  He completed toilet hygiene and clothing management with min assist as well.  Mod assist for sit to stand from lower toilet with use of the grab bars.  Next positioned pt over at the sink for work on bathing tasks.  He was able to wash his hands and his face with setup.  Pt left with next therapist to finish bathing and dressing tasks at the sink.      Therapy Documentation Precautions:  Precautions Precautions: Fall Restrictions Weight Bearing Restrictions: No  Pain: Pain Assessment Pain Assessment: No/denies pain ADL: See Function Navigator for Current Functional Status.   Therapy/Group: Individual Therapy  Clio Gerhart,Eydan OTR/L 03/23/2017, 12:53 PM

## 2017-03-23 NOTE — Progress Notes (Signed)
Physical Therapy Note  Patient Details  Name: Daniel [name] MRN: [id number] Date of Birth: 15-Nov-1935 Today's Date: 03/23/2017    Time: 1300-1400 60 minutes  1:1 Pt c/o back/buttock pain when sitting edge of recliner to don shoes, eases when out of position.  Attempted to have pt don shoes with use of long handled shoe horn, pt c/o pain and is unable to complete task, PT provides total A to don shoes.  Sit to stand from recliner with mod A.  Gait 80' x 2, 50' x 2 with RW and close supervision, cues to stay close to RW.  W/c mobility 100' x 2 with cues for steering and to avoid obstacles.  Sit to stand from w/c, nustep and standard chair all multiple reps with mod A this session.   nustep x 6 minutes with 1 seated rest break at level 4.  Pt continues to require increased time for all activities and frequent rests due to fatigue. Pt requires min A with sit to supine and positioning in bed this session. Pt left in bed with alarm on, needs at hand.  Coreon Simkins 03/23/2017, 2:55 PM

## 2017-03-24 ENCOUNTER — Inpatient Hospital Stay (HOSPITAL_COMMUNITY): Payer: PPO | Admitting: Physical Therapy

## 2017-03-24 ENCOUNTER — Inpatient Hospital Stay (HOSPITAL_COMMUNITY): Payer: PPO | Admitting: Occupational Therapy

## 2017-03-24 LAB — GLUCOSE, CAPILLARY
GLUCOSE-CAPILLARY: 137 mg/dL — AB (ref 65–99)
GLUCOSE-CAPILLARY: 100 mg/dL — AB (ref 65–99)
GLUCOSE-CAPILLARY: 122 mg/dL — AB (ref 65–99)
Glucose-Capillary: 130 mg/dL — ABNORMAL HIGH (ref 65–99)
Glucose-Capillary: 133 mg/dL — ABNORMAL HIGH (ref 65–99)

## 2017-03-24 MED ORDER — GLIMEPIRIDE 1 MG PO TABS
1.0000 mg | ORAL_TABLET | Freq: Once | ORAL | Status: AC
  Administered 2017-03-24: 1 mg via ORAL
  Filled 2017-03-24: qty 1

## 2017-03-24 MED ORDER — GLIMEPIRIDE 1 MG PO TABS
2.0000 mg | ORAL_TABLET | Freq: Every day | ORAL | Status: DC
Start: 2017-03-25 — End: 2017-03-28
  Administered 2017-03-25 – 2017-03-28 (×4): 2 mg via ORAL
  Filled 2017-03-24 (×4): qty 2

## 2017-03-24 NOTE — Progress Notes (Signed)
Occupational Therapy Session Note  Patient Details  Name: ARAS ALBARRAN MRN: [id number] Date of Birth: Jul 17, 1935  Today's Date: 03/24/2017 OT Individual Time: 1430-1500 OT Individual Time Calculation (min): 30 min    Short Term Goals: Week 2:  OT Short Term Goal 1 (Week 2): Pt will complete Sit<>stand with consistent Mod A in preparation for BADL tasks OT Short Term Goal 2 (Week 2): Pt will complete toilet transfers to 3 in 1 over toilet with Mod A to reduce caregiver burden  OT Short Term Goal 3 (Week 2): Pt will complete LB dressing with Mod A and Mod cues for sequencing  Skilled Therapeutic Interventions/Progress Updates:    Treatment session with focus on sit <> stand.  Pt received supine in bed requiring increased time and encouragement to come to EOB (with supervision).  Engaged in sit <> stand x5 with mod assist for each.  Multimodal cues for anterior weight shift and counting to 3 for sit > stand to increase initiation and weight shift.  Tactile cues to achieve full upright posture after each stand.  Returned to bed with supervision and engaged in bridging for strengthening and to scoot up in bed.  Therapy Documentation Precautions:  Precautions Precautions: Fall Restrictions Weight Bearing Restrictions: No Pain:  Pt with no c/o pain  See Function Navigator for Current Functional Status.   Therapy/Group: Individual Therapy  [name] 03/24/2017, 3:58 PM

## 2017-03-24 NOTE — Progress Notes (Signed)
[place] PHYSICAL MEDICINE & REHABILITATION     PROGRESS NOTE  Subjective/Complaints:  Patient seen lying in bed this morning. He states he slept well overnight.  ROS: Denies CP, SOB, N/V/D.  Objective: Vital Signs: Blood pressure 121/62, pulse 66, temperature 97.9 F (36.6 C), temperature source Oral, resp. rate (!) 21, weight 75.4 kg (166 lb 3.6 oz), SpO2 96 %. No results found. No results for input(s): WBC, HGB, HCT, PLT in the last 72 hours. No results for input(s): NA, K, CL, GLUCOSE, BUN, CREATININE, CALCIUM in the last 72 hours.  Invalid input(s): CO CBG (last 3)  Recent Labs    03/24/17 0616 03/24/17 0623 03/24/17 1134  GLUCAP 130* 133* 137*    Wt Readings from Last 3 Encounters:  03/20/17 75.4 kg (166 lb 3.6 oz)  03/09/17 70.3 kg (155 lb)  11/16/16 70.3 kg (155 lb)    Physical Exam:  BP 121/62 (BP Location: Right Arm)   Pulse 66   Temp 97.9 F (36.6 C) (Oral)   Resp (!) 21   Wt 75.4 kg (166 lb 3.6 oz)   SpO2 96%   BMI 21.93 kg/m  Constitutional: He appears well-developed and well-nourished.  HENT: Bilateral burr hole sites c/d/i Eyes: EOM are normal. No discharge.  Cardiovascular: RRR. No JVD. Respiratory: Effort normal and breath sounds normal.  GI: Bowel sounds are normal. Nondistended Musculoskeletal: No edema or tenderness in extremities  Neurological: He is alert.  Makes good eye contact with examiner.  Follows simple commands.  Motor: B/l UE: 4/5  B/l LE: HF 4/5, KE 4/5, ADF/PF 4+/5 (stable) Skin: See above  Psychiatric: He has a normal mood and affect. His behavior is normal.   Assessment/Plan: 1. Functional deficits secondary to bilateral subdural hematomas with midline shift. Status post burr hole evacuation 03/09/2017 which require 3+ hours per day of interdisciplinary therapy in a comprehensive inpatient rehab setting. Physiatrist is providing close team supervision and 24 hour management of active medical problems listed  below. Physiatrist and rehab team continue to assess barriers to discharge/monitor patient progress toward functional and medical goals.  Function:  Bathing Bathing position   Position: Wheelchair/chair at sink  Bathing parts Body parts bathed by patient: Right arm, Left arm, Chest, Abdomen, Front perineal area, Right upper leg, Left upper leg Body parts bathed by helper: Buttocks, Right lower leg, Left lower leg, Back  Bathing assist Assist Level: Touching or steadying assistance(Pt > 75%)(Mod assist)      Upper Body Dressing/Undressing Upper body dressing   What is the patient wearing?: Pull over shirt/dress     Pull over shirt/dress - Perfomed by patient: Thread/unthread right sleeve, Thread/unthread left sleeve, Put head through opening Pull over shirt/dress - Perfomed by helper: Pull shirt over trunk        Upper body assist Assist Level: (Min assist)      Lower Body Dressing/Undressing Lower body dressing   What is the patient wearing?: Pants, Non-skid slipper socks     Pants- Performed by patient: Pull pants up/down, Thread/unthread right pants leg Pants- Performed by helper: Thread/unthread left pants leg   Non-skid slipper socks- Performed by helper: Don/doff right sock, Don/doff left sock       Shoes - Performed by helper: Don/doff right shoe, Don/doff left shoe          Lower body assist Assist for lower body dressing: (Max assist)      Toileting Toileting   Toileting steps completed by patient: Adjust clothing prior to toileting,  Performs perineal hygiene, Adjust clothing after toileting Toileting steps completed by helper: Adjust clothing prior to toileting, Performs perineal hygiene Toileting Assistive Devices: Grab bar or rail  Toileting assist Assist level: Touching or steadying assistance (Pt.75%)   Transfers Chair/bed transfer   Chair/bed transfer method: Stand pivot Chair/bed transfer assist level: Moderate assist (Pt 50 - 74%/lift or  lower) Chair/bed transfer assistive device: Armrests, Medical sales representative     Max distance: 40 ft Assist level: Touching or steadying assistance (Pt > 75%)   Wheelchair   Type: Manual Max wheelchair distance: 50 ft Assist Level: Touching or steadying assistance (Pt > 75%)  Cognition Comprehension Comprehension assist level: Understands basic 90% of the time/cues < 10% of the time  Expression Expression assist level: Expresses basic 75 - 89% of the time/requires cueing 10 - 24% of the time. Needs helper to occlude trach/needs to repeat words.  Social Interaction Social Interaction assist level: Interacts appropriately 75 - 89% of the time - Needs redirection for appropriate language or to initiate interaction.  Problem Solving Problem solving assist level: Solves basic 50 - 74% of the time/requires cueing 25 - 49% of the time  Memory Memory assist level: Recognizes or recalls 50 - 74% of the time/requires cueing 25 - 49% of the time    Medical Problem List and Plan: 1.  Decreased functional mobility secondary to bilateral subdural hematomas with midline shift. Status post burr hole evacuation 03/09/2017   Cont CIR    Discussed case with physician at [facility] as well as case Freight forwarder on 3/8 2.  DVT Prophylaxis/Anticoagulation: SCDs. Patient is ambulatory 3. Pain Management: Lyrica 50 mg twice a day, Hydrocodone and Robaxin as needed 4. Mood/Alzheimer's. Exelon patch 9.5 mg daily 5. Neuropsych: This patient is not fully capable of making decisions on his own behalf. 6. Skin/Wound Care: Routine skin checks 7. Fluids/Electrolytes/Nutrition: Routine I&O's    BUN/CR trending up on 3/4    Encourage fluids   Labs ordered for Monday 8. Seizure prophylaxis. Keppra 500 mg twice a day, d/ced on 3/4 9. Diabetes mellitus peripheral neuropathy. Hemoglobin A1c 6.9. Check blood sugars before meals and at bedtime. Diabetic teaching   Glucophage 500 mg twice a day,  increased to 850 twice a day on 2/28, increased to 1000 BID on 3/4.   Amaryl 1 mg started on 3/7, increased to 2 mg on 3/9   CBG (last 3)  Recent Labs    03/24/17 0616 03/24/17 0623 03/24/17 1134  GLUCAP 130* 133* 137*   10. Hypertension. Altace 2.5 mg daily    Vitals:   03/23/17 1500 03/24/17 0055  BP: 112/60 121/62  Pulse: 76 66  Resp: 17 (!) 21  Temp: (!) 97.5 F (36.4 C) 97.9 F (36.6 C)  SpO2: 99% 96%    Controlled 3/9 11. Hyperlipidemia. Lipitor/Zetia 12. Constipation. Laxative assistance 13. Hypoalbuminemia   Supplement initiated on 2/27 14. ABLA: Resolved   Hb 13.9 on 3/4   Cont to monitor 15.  Urinary incont with urgency    Timed toileting  Flomax started on 2/7   UA unremarkable  PVRs ?improving    LOS (Days) 11 A FACE TO FACE EVALUATION WAS PERFORMED  Deriona Altemose Lorie Phenix 03/24/2017 12:36 PM

## 2017-03-24 NOTE — Progress Notes (Signed)
Physical Therapy Session Note  Patient Details  Name: Daniel [name] MRN: [id number] Date of Birth: 24-Sep-1935  Today's Date: 03/24/2017 PT Individual Time: 1015-1045 PT Individual Time Calculation (min): 30 min   Short Term Goals: Week 2:  PT Short Term Goal 1 (Week 2): =LTG  Skilled Therapeutic Interventions/Progress Updates:    Pt semi-reclined in bed, agreeable to participate in therapy session. Supine to sit with SBA with increased time, use of bedrails, HOB elevated. Sit to stand Mod A to RW, assisted pt with pulling up pants while standing with RW. Stand pivot transfer bed to w/c with Min A and RW. Per pt request therapy session focus on ADL tasks. Pt performs ADL tasks such as brushing his teeth and shaving while seated in w/c at sink. Pt is setup assist for ADL tasks. Stand pivot transfer w/c to recliner with Mod A to stand from w/c, Min A for transfer with RW once standing. Pt left seated in recliner in room with needs in reach, QRB in place.  Therapy Documentation Precautions:  Precautions Precautions: Fall Restrictions Weight Bearing Restrictions: No  See Function Navigator for Current Functional Status.  Therapy/Group: Individual Therapy  Excell Seltzer, PT, DPT  03/24/2017, 12:38 PM

## 2017-03-25 ENCOUNTER — Inpatient Hospital Stay (HOSPITAL_COMMUNITY): Payer: PPO

## 2017-03-25 ENCOUNTER — Inpatient Hospital Stay (HOSPITAL_COMMUNITY): Payer: PPO | Admitting: Occupational Therapy

## 2017-03-25 LAB — GLUCOSE, CAPILLARY
GLUCOSE-CAPILLARY: 103 mg/dL — AB (ref 65–99)
Glucose-Capillary: 187 mg/dL — ABNORMAL HIGH (ref 65–99)
Glucose-Capillary: 137 mg/dL — ABNORMAL HIGH (ref 65–99)
Glucose-Capillary: 79 mg/dL (ref 65–99)

## 2017-03-25 NOTE — Progress Notes (Signed)
[place] PHYSICAL MEDICINE & REHABILITATION     PROGRESS NOTE  Subjective/Complaints:  Patient seen lying in bed this morning. He states he slept well overnight but he is still sleepy this morning.  ROS: Denies CP, SOB, N/V/D.  Objective: Vital Signs: Blood pressure 110/87, pulse 68, temperature 97.6 F (36.4 C), temperature source Oral, resp. rate 16, weight 75.4 kg (166 lb 3.6 oz), SpO2 97 %. No results found. No results for input(s): WBC, HGB, HCT, PLT in the last 72 hours. No results for input(s): NA, K, CL, GLUCOSE, BUN, CREATININE, CALCIUM in the last 72 hours.  Invalid input(s): CO CBG (last 3)  Recent Labs    03/24/17 1641 03/24/17 2146 03/25/17 0624  GLUCAP 100* 122* 103*    Wt Readings from Last 3 Encounters:  03/20/17 75.4 kg (166 lb 3.6 oz)  03/09/17 70.3 kg (155 lb)  11/16/16 70.3 kg (155 lb)    Physical Exam:  BP 110/87 (BP Location: Right Arm)   Pulse 68   Temp 97.6 F (36.4 C) (Oral)   Resp 16   Wt 75.4 kg (166 lb 3.6 oz)   SpO2 97%   BMI 21.93 kg/m  Constitutional: He appears well-developed and well-nourished.  HENT: Bilateral burr hole sites c/d/i Eyes: EOM are normal. No discharge.  Cardiovascular: RRR. No JVD. Respiratory: Effort normal and breath sounds normal.  GI: Bowel sounds are normal. Nondistended Musculoskeletal: No edema or tenderness in extremities  Neurological: He is alert.  Makes good eye contact with examiner.  Follows simple commands.  Motor: B/l UE: 4/5  B/l LE: HF 4/5, KE 4/5, ADF/PF 4+/5 (unchanged) Skin: See above  Psychiatric: He has a normal mood and affect. His behavior is normal.   Assessment/Plan: 1. Functional deficits secondary to bilateral subdural hematomas with midline shift. Status post burr hole evacuation 03/09/2017 which require 3+ hours per day of interdisciplinary therapy in a comprehensive inpatient rehab setting. Physiatrist is providing close team supervision and 24 hour management of active medical  problems listed below. Physiatrist and rehab team continue to assess barriers to discharge/monitor patient progress toward functional and medical goals.  Function:  Bathing Bathing position   Position: Wheelchair/chair at sink  Bathing parts Body parts bathed by patient: Right arm, Left arm, Chest, Abdomen, Front perineal area, Right upper leg, Left upper leg Body parts bathed by helper: Buttocks, Right lower leg, Left lower leg, Back  Bathing assist Assist Level: Touching or steadying assistance(Pt > 75%)(Mod assist)      Upper Body Dressing/Undressing Upper body dressing   What is the patient wearing?: Pull over shirt/dress     Pull over shirt/dress - Perfomed by patient: Thread/unthread right sleeve, Thread/unthread left sleeve, Put head through opening Pull over shirt/dress - Perfomed by helper: Pull shirt over trunk        Upper body assist Assist Level: (Min assist)      Lower Body Dressing/Undressing Lower body dressing   What is the patient wearing?: Pants, Non-skid slipper socks     Pants- Performed by patient: Pull pants up/down, Thread/unthread right pants leg Pants- Performed by helper: Thread/unthread left pants leg   Non-skid slipper socks- Performed by helper: Don/doff right sock, Don/doff left sock       Shoes - Performed by helper: Don/doff right shoe, Don/doff left shoe          Lower body assist Assist for lower body dressing: (Max assist)      Toileting Toileting   Toileting steps completed by patient:  Adjust clothing prior to toileting, Performs perineal hygiene, Adjust clothing after toileting Toileting steps completed by helper: Adjust clothing prior to toileting, Performs perineal hygiene, Adjust clothing after toileting Toileting Assistive Devices: Grab bar or rail  Toileting assist Assist level: Touching or steadying assistance (Pt.75%)   Transfers Chair/bed transfer   Chair/bed transfer method: Stand pivot Chair/bed transfer assist  level: Touching or steadying assistance (Pt > 75%) Chair/bed transfer assistive device: Walker, Air cabin crew     Max distance: 40 ft Assist level: Touching or steadying assistance (Pt > 75%)   Wheelchair   Type: Manual Max wheelchair distance: 50 ft Assist Level: Touching or steadying assistance (Pt > 75%)  Cognition Comprehension Comprehension assist level: Understands basic 90% of the time/cues < 10% of the time  Expression Expression assist level: Expresses basic 90% of the time/requires cueing < 10% of the time.  Social Interaction Social Interaction assist level: Interacts appropriately 90% of the time - Needs monitoring or encouragement for participation or interaction.  Problem Solving Problem solving assist level: Solves basic 90% of the time/requires cueing < 10% of the time  Memory Memory assist level: Recognizes or recalls 90% of the time/requires cueing < 10% of the time    Medical Problem List and Plan: 1.  Decreased functional mobility secondary to bilateral subdural hematomas with midline shift. Status post burr hole evacuation 03/09/2017   Cont CIR    Discussed case with physician at [facility] as well as case Freight forwarder on 3/8 2.  DVT Prophylaxis/Anticoagulation: SCDs. Patient is ambulatory 3. Pain Management: Lyrica 50 mg twice a day, Hydrocodone and Robaxin as needed 4. Mood/Alzheimer's. Exelon patch 9.5 mg daily 5. Neuropsych: This patient is not fully capable of making decisions on his own behalf. 6. Skin/Wound Care: Routine skin checks 7. Fluids/Electrolytes/Nutrition: Routine I&O's    BUN/CR trending up on 3/4    Encourage fluids   Labs ordered for tomorrow 8. Seizure prophylaxis. Keppra 500 mg twice a day, d/ced on 3/4 9. Diabetes mellitus peripheral neuropathy. Hemoglobin A1c 6.9. Check blood sugars before meals and at bedtime. Diabetic teaching   Glucophage 500 mg twice a day, increased to 850 twice a day on 2/28,  increased to 1000 BID on 3/4.   Amaryl 1 mg started on 3/7, increased to 2 mg on 3/9   CBG (last 3)  Recent Labs    03/24/17 1641 03/24/17 2146 03/25/17 0624  GLUCAP 100* 122* 103*    Improving on 3/10 10. Hypertension. Altace 2.5 mg daily    Vitals:   03/24/17 1600 03/25/17 0143  BP: 120/64 110/87  Pulse: 69 68  Resp: 18 16  Temp: 98.2 F (36.8 C) 97.6 F (36.4 C)  SpO2: 98% 97%    Controlled 3/10 11. Hyperlipidemia. Lipitor/Zetia 12. Constipation. Laxative assistance 13. Hypoalbuminemia   Supplement initiated on 2/27 14. ABLA: Resolved   Hb 13.9 on 3/4   Cont to monitor 15.  Urinary incont with urgency    Timed toileting  Flomax started on 2/7   UA unremarkable  PVRs ?improving    LOS (Days) 12 A FACE TO FACE EVALUATION WAS PERFORMED  Ankit Lorie Phenix 03/25/2017 8:24 AM

## 2017-03-25 NOTE — Progress Notes (Signed)
Occupational Therapy Session Note  Patient Details  Name: Daniel [name] MRN: [id number] Date of Birth: March 30, 1935  Today's Date: 03/25/2017 OT Individual Time: 9937-1696 OT Individual Time Calculation (min): 57 min    Short Term Goals: Week 1:  OT Short Term Goal 1 (Week 1): Pt will complete Sit<>stand with consistent Mod A in preparation for BADL tasks OT Short Term Goal 1 - Progress (Week 1): Progressing toward goal OT Short Term Goal 2 (Week 1): Pt will complete toilet transfers with consitent Mod A to reduce caregiver burden  OT Short Term Goal 2 - Progress (Week 1): Progressing toward goal OT Short Term Goal 3 (Week 1): Pt will tolerate standing at the sink for 1 minute to increase activity tolerance for BADL tasks OT Short Term Goal 3 - Progress (Week 1): Met  Skilled Therapeutic Interventions/Progress Updates:    1:1. Pt agreeable to bathe and dress at shower level. Pt reporting pain in L hip but does not want medication. PT completes all stand pivot transfer at MOD A level with lifting assistance and VC for anterior weight shift. Pt bathes with VC for sequencing bathing body parts and min A for standing balance when washing buttocks. Pt dresses at sit to stand level with supervision for shirt and touching A to don pants while completing clothing management. Pt dons socks crossing into figure 4 with touching A for LLE only to maintain positioning. Exited session with pt seated in bed, exit alarm on, call light in reach and all needs met.  Therapy Documentation Precautions:  Precautions Precautions: Fall Restrictions Weight Bearing Restrictions: No  See Function Navigator for Current Functional Status.   Therapy/Group: Individual Therapy  [name] 03/25/2017, 1:57 PM

## 2017-03-25 NOTE — Plan of Care (Signed)
  RH BLADDER ELIMINATION RH STG MANAGE BLADDER WITH ASSISTANCE Description STG Manage Bladder With Assistance min  03/25/2017 1832 - Not Progressing by Erie Noe, LPN  Resident has had several incontinent episodes this shift. Educated pt to call for staff when needing to use urinal or has a BM and can get on the [facility] or toilet. Pt uses brief before calling for staff.

## 2017-03-25 NOTE — Progress Notes (Signed)
Occupational Therapy Session Note  Patient Details  Name: Daniel [name] MRN: [id number] Date of Birth: 1935-09-08  Today's Date: 03/25/2017 OT Individual Time: 1450-1540 OT Individual Time Calculation (min): 50 min   Skilled Therapeutic Interventions/Progress Updates:    Pt greeted supine in bed, no c/o pain and agreeable to tx. Requesting to work on LE strengthening. Tx focus on activity tolerance, UE/LE strengthening, and balance during modified tap dancing activity. Pt transitioning to EOB with extra time and supervision. He recalled various tap dance steps while sitting, elevating LEs interchangeably and pumping ankles during heel-toe taps. He intermittently incorporated UE movement. Transitioned to dancing while standing with bilateral UE support on RW (Mod A sit<stand). Pt swaying hips and tapping toes while weight-shifting forward to view feet. 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 Afterwards pt transitioned back to bed, boosted himself up with instruction, and was left with all needs within reach and bed alarm set.   Therapy Documentation Precautions:  Precautions Precautions: Fall Restrictions Weight Bearing Restrictions: No Vital Signs: Therapy Vitals Temp: 97.6 F (36.4 C) Temp Source: Oral Pulse Rate: 74 Resp: 16 BP: (!) 117/58 Patient Position (if appropriate): Lying Oxygen Therapy SpO2: 98 % O2 Device: Room Air Pain: No c/o pain during session    ADL: ADL ADL Comments: Please see functional navigator     See Function Navigator for Current Functional Status.   Therapy/Group: Individual Therapy  Eduin Friedel A Onia Shiflett 03/25/2017, 4:47 PM

## 2017-03-26 ENCOUNTER — Inpatient Hospital Stay (HOSPITAL_COMMUNITY): Payer: PPO | Admitting: Occupational Therapy

## 2017-03-26 ENCOUNTER — Inpatient Hospital Stay (HOSPITAL_COMMUNITY): Payer: PPO | Admitting: Physical Therapy

## 2017-03-26 DIAGNOSIS — R0989 Other specified symptoms and signs involving the circulatory and respiratory systems: Secondary | ICD-10-CM

## 2017-03-26 LAB — BASIC METABOLIC PANEL
ANION GAP: 13 (ref 5–15)
BUN: 20 mg/dL (ref 6–20)
CALCIUM: 10.2 mg/dL (ref 8.9–10.3)
CO2: 24 mmol/L (ref 22–32)
Chloride: 99 mmol/L — ABNORMAL LOW (ref 101–111)
Creatinine, Ser: 0.86 mg/dL (ref 0.61–1.24)
GFR calc Af Amer: >60 mL/min (ref >60)
Glucose, Bld: 129 mg/dL — ABNORMAL HIGH (ref 65–99)
Potassium: 4.1 mmol/L (ref 3.5–5.1)
SODIUM: 136 mmol/L (ref 135–145)

## 2017-03-26 LAB — CBC WITH DIFFERENTIAL/PLATELET
BASOS ABS: 0 10*3/uL (ref 0.0–0.1)
BASOS PCT: 0 %
EOS PCT: 8 %
Eosinophils Absolute: 0.9 10*3/uL — ABNORMAL HIGH (ref 0.0–0.7)
HEMATOCRIT: 42.2 % (ref 39.0–52.0)
Hemoglobin: 13.6 g/dL (ref 13.0–17.0)
Lymphocytes Relative: 15 %
Lymphs Abs: 1.7 10*3/uL (ref 0.7–4.0)
MCH: 29.1 pg (ref 26.0–34.0)
MCHC: 32.2 g/dL (ref 30.0–36.0)
MCV: 90.4 fL (ref 78.0–100.0)
MONO ABS: 0.6 10*3/uL (ref 0.1–1.0)
Monocytes Relative: 6 %
NEUTROS ABS: 8 10*3/uL — AB (ref 1.7–7.7)
Neutrophils Relative %: 71 %
PLATELETS: 334 10*3/uL (ref 150–400)
RBC: 4.67 MIL/uL (ref 4.22–5.81)
RDW: 14 % (ref 11.5–15.5)
WBC: 11.2 10*3/uL — AB (ref 4.0–10.5)

## 2017-03-26 LAB — GLUCOSE, CAPILLARY
GLUCOSE-CAPILLARY: 132 mg/dL — AB (ref 65–99)
Glucose-Capillary: 118 mg/dL — ABNORMAL HIGH (ref 65–99)
Glucose-Capillary: 128 mg/dL — ABNORMAL HIGH (ref 65–99)
Glucose-Capillary: 124 mg/dL — ABNORMAL HIGH (ref 65–99)

## 2017-03-26 NOTE — NC FL2 (Signed)
[place] LEVEL OF CARE SCREENING TOOL     IDENTIFICATION  Patient Name: Daniel [name] Birthdate: 09/25/1935 Sex: male Admission Date (Current Location): 03/13/2017  [facility] and [place] Number:  Herbalist and Address:  [facility], [place] [address] [place], [place] [address]      Provider Number: [id number]  Attending Physician Name and Address:  Jamse Arn, MD  Relative Name and Phone Number:       Current Level of Care: Other (Comment)(Acute Inpatient Rehab) Recommended Level of Care: [place] Prior Approval Number:    Date Approved/Denied:   PASRR Number: [id number]  Discharge Plan: SNF    Current Diagnoses: Patient Active Problem List   Diagnosis Date Noted  . Labile blood pressure   . Dementia without behavioral disturbance   . Abnormal BUN-to-creatinine ratio   . Urinary incontinence   . Labile blood glucose   . Type 2 diabetes mellitus with peripheral neuropathy (HCC)   . Subdural hematoma ([place]) 03/13/2017  . Encephalopathy   . Seizure prophylaxis   . Hyperlipidemia   . Slow transit constipation   . Steroid-induced hyperglycemia   . Diabetes mellitus type 2 in nonobese (HCC)   . Tachypnea   . Malnutrition of moderate degree 03/08/2017  . 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 Urinary frequency 11/16/2016  . Postprocedural hypotension   . Dural tear   . Alzheimer's dementia without behavioral disturbance   . Chronic back pain   . Post-operative pain   . Benign essential HTN   . Leukocytosis   . Acute blood loss anemia   . Thrombocytopenia ([place])   . Spondylolisthesis of lumbar region 09/12/2016  . Essential hypertension 09/12/2016  . Surgery, elective   . Diarrhea 06/08/2016  . Memory loss 03/09/2016  . Essential tremor 03/09/2016  . Diabetic neuropathy ([place]) 03/09/2016  . Vitamin D deficiency 03/09/2016    Orientation RESPIRATION BLADDER Height & Weight     Self, Situation,  Place  Normal Incontinent Weight: 75.4 kg (166 lb 3.6 oz) Height:     BEHAVIORAL SYMPTOMS/MOOD NEUROLOGICAL BOWEL NUTRITION STATUS      Incontinent Diet(regular)  AMBULATORY STATUS COMMUNICATION OF NEEDS Skin   Limited Assist Verbally PU Stage and Appropriate Care, Surgical wounds(stage 1 buttocks) PU Stage 1 Dressing: (q3 days and prn if soiled)                     Personal Care Assistance Level of Assistance  Bathing, Feeding, Dressing Bathing Assistance: Limited assistance Feeding assistance: (supervision) Dressing Assistance: Limited assistance     Functional Limitations Info             SPECIAL CARE FACTORS FREQUENCY  PT (By licensed PT), OT (By licensed OT), Bowel and bladder program, Speech therapy     PT Frequency: 5x/wk OT Frequency: 5x/wk Bowel and Bladder Program Frequency: qd   Speech Therapy Frequency: 5x/wk      Contractures Contractures Info: Not present    Additional Factors Info  Psychotropic, Insulin Sliding Scale Code Status Info: Full Allergies Info: donepezil, tape Psychotropic Info: exelon Insulin Sliding Scale Info: see med list       Current Medications (03/26/2017):  This is the current hospital active medication list Current Facility-Administered Medications  Medication Dose Route Frequency Provider Last Rate Last Dose  . acetaminophen (TYLENOL) tablet 650 mg  650 mg Oral Q4H PRN Cathlyn Parsons, PA-C   650 mg at 03/18/17 2014  Or  . acetaminophen (TYLENOL) suppository 650 mg  650 mg Rectal Q4H PRN Angiulli, Lavon Paganini, PA-C      . atorvastatin (LIPITOR) tablet 40 mg  40 mg Oral q1800 AngiulliLavon Paganini, PA-C   40 mg at 03/25/17 1726  . bisacodyl (DULCOLAX) suppository 10 mg  10 mg Rectal Daily PRN Angiulli, Lavon Paganini, PA-C      . calcium-vitamin D (OSCAL WITH D) 500-200 MG-UNIT per tablet 1 tablet  1 tablet Oral Q breakfast Cathlyn Parsons, PA-C   1 tablet at 03/26/17 3149  . cholecalciferol (VITAMIN D) tablet 2,000 Units  2,000  Units Oral Daily Cathlyn Parsons, PA-C   2,000 Units at 03/26/17 7026  . ezetimibe (ZETIA) tablet 10 mg  10 mg Oral QPM AngiulliLavon Paganini, PA-C   10 mg at 03/25/17 1725  . feeding supplement (GLUCERNA SHAKE) (GLUCERNA SHAKE) liquid 237 mL  237 mL Oral TID BM AngiulliLavon Paganini, PA-C   237 mL at 03/26/17 0934  . feeding supplement (PRO-STAT SUGAR FREE 64) liquid 30 mL  30 mL Oral BID Jamse Arn, MD   30 mL at 03/26/17 0833  . glimepiride (AMARYL) tablet 2 mg  2 mg Oral Q breakfast Jamse Arn, MD   2 mg at 03/26/17 3785  . HYDROcodone-acetaminophen (NORCO/VICODIN) 5-325 MG per tablet 1 tablet  1 tablet Oral Q4H PRN Cathlyn Parsons, PA-C   1 tablet at 03/15/17 1353  . insulin aspart (novoLOG) injection 0-5 Units  0-5 Units Subcutaneous QHS Angiulli, Lavon Paganini, PA-C      . metFORMIN (GLUCOPHAGE) tablet 1,000 mg  1,000 mg Oral BID WC Jamse Arn, MD   1,000 mg at 03/26/17 0833  . methocarbamol (ROBAXIN) tablet 500 mg  500 mg Oral Q6H PRN Cathlyn Parsons, PA-C   500 mg at 03/18/17 2014  . multivitamin with minerals tablet 1 tablet  1 tablet Oral Daily Cathlyn Parsons, PA-C   1 tablet at 03/26/17 8850  . ondansetron (ZOFRAN) tablet 4 mg  4 mg Oral Q6H PRN Angiulli, Lavon Paganini, PA-C       Or  . ondansetron [facility]) injection 4 mg  4 mg Intravenous Q6H PRN Angiulli, Lavon Paganini, PA-C      . ondansetron [facility]) tablet 4 mg  4 mg Oral Q4H PRN Angiulli, Lavon Paganini, PA-C       Or  . ondansetron [facility]) injection 4 mg  4 mg Intravenous Q4H PRN Angiulli, Lavon Paganini, PA-C      . pantoprazole (PROTONIX) EC tablet 40 mg  40 mg Oral QHS Cathlyn Parsons, PA-C   40 mg at 03/25/17 2103  . pregabalin (LYRICA) capsule 50 mg  50 mg Oral BID Cathlyn Parsons, PA-C   50 mg at 03/26/17 2774  . ramipril (ALTACE) capsule 2.5 mg  2.5 mg Oral Daily Cathlyn Parsons, PA-C   2.5 mg at 03/26/17 1287  . rivastigmine (EXELON) 9.5 mg/24hr 9.5 mg  9.5 mg Transdermal Daily Cathlyn Parsons, PA-C   9.5  mg at 03/26/17 0836  . senna (SENOKOT) tablet 8.6 mg  1 tablet Oral BID Cathlyn Parsons, PA-C   8.6 mg at 03/26/17 8676  . sorbitol 70 % solution 30 mL  30 mL Oral Daily PRN Angiulli, Lavon Paganini, PA-C      . tamsulosin (FLOMAX) capsule 0.4 mg  0.4 mg Oral QPC breakfast Jamse Arn, MD   0.4 mg at 03/26/17 [phone]  Discharge Medications: Please see discharge summary for a list of discharge medications.  Relevant Imaging Results:  Relevant Lab Results:   Additional Information SS#: [id number]; we have pt using a quick release belt for safety, however, he has not made any attempts to get up unassisted.  Could use hi-lo bed in SNF.  Joscelyn Hardrick, LCSW

## 2017-03-26 NOTE — Progress Notes (Signed)
[place] PHYSICAL MEDICINE & REHABILITATION     PROGRESS NOTE  Subjective/Complaints:  Patient seen lying in bed this morning. He slept well overnight. He asks that I make him better.  ROS: Denies CP, SOB, N/V/D.  Objective: Vital Signs: Blood pressure (!) 142/64, pulse 74, temperature 98.3 F (36.8 C), temperature source Oral, resp. rate 17, weight 75.4 kg (166 lb 3.6 oz), SpO2 97 %. No results found. No results for input(s): WBC, HGB, HCT, PLT in the last 72 hours. No results for input(s): NA, K, CL, GLUCOSE, BUN, CREATININE, CALCIUM in the last 72 hours.  Invalid input(s): CO CBG (last 3)  Recent Labs    03/25/17 1646 03/25/17 2100 03/26/17 0649  GLUCAP 137* 79 118*    Wt Readings from Last 3 Encounters:  03/20/17 75.4 kg (166 lb 3.6 oz)  03/09/17 70.3 kg (155 lb)  11/16/16 70.3 kg (155 lb)    Physical Exam:  BP (!) 142/64 (BP Location: Right Arm)   Pulse 74   Temp 98.3 F (36.8 C) (Oral)   Resp 17   Wt 75.4 kg (166 lb 3.6 oz)   SpO2 97%   BMI 21.93 kg/m  Constitutional: He appears well-developed and well-nourished.  HENT: Bilateral burr hole sites c/d/i Eyes: EOM are normal. No discharge.  Cardiovascular: RRR. No JVD. Respiratory: Effort normal and breath sounds normal.  GI: Bowel sounds are normal. Nondistended Musculoskeletal: No edema or tenderness in extremities  Neurological: He is alert.  Makes good eye contact with examiner.  Follows simple commands.  Motor: B/l UE: 4/5  B/l LE: HF 4/5, KE 4/5, ADF/PF 4+/5 (stable) Skin: See above  Psychiatric: He has a normal mood and affect. His behavior is normal.   Assessment/Plan: 1. Functional deficits secondary to bilateral subdural hematomas with midline shift. Status post burr hole evacuation 03/09/2017 which require 3+ hours per day of interdisciplinary therapy in a comprehensive inpatient rehab setting. Physiatrist is providing close team supervision and 24 hour management of active medical problems  listed below. Physiatrist and rehab team continue to assess barriers to discharge/monitor patient progress toward functional and medical goals.  Function:  Bathing Bathing position   Position: Wheelchair/chair at sink  Bathing parts Body parts bathed by patient: Right arm, Left arm, Chest, Abdomen, Front perineal area, Right upper leg, Left upper leg, Right lower leg, Left lower leg, Buttocks Body parts bathed by helper: Back  Bathing assist Assist Level: Touching or steadying assistance(Pt > 75%)      Upper Body Dressing/Undressing Upper body dressing   What is the patient wearing?: Pull over shirt/dress     Pull over shirt/dress - Perfomed by patient: Thread/unthread right sleeve, Thread/unthread left sleeve, Put head through opening Pull over shirt/dress - Perfomed by helper: Pull shirt over trunk        Upper body assist Assist Level: (Min assist)      Lower Body Dressing/Undressing Lower body dressing   What is the patient wearing?: Pants, Non-skid slipper socks     Pants- Performed by patient: Pull pants up/down, Thread/unthread right pants leg, Thread/unthread left pants leg Pants- Performed by helper: Thread/unthread left pants leg Non-skid slipper socks- Performed by patient: Don/doff right sock Non-skid slipper socks- Performed by helper: Don/doff left sock       Shoes - Performed by helper: Don/doff right shoe, Don/doff left shoe          Lower body assist Assist for lower body dressing: (Max assist)      Psychologist, occupational  Toileting steps completed by patient: Adjust clothing prior to toileting, Performs perineal hygiene, Adjust clothing after toileting Toileting steps completed by helper: Adjust clothing prior to toileting, Performs perineal hygiene, Adjust clothing after toileting Toileting Assistive Devices: Grab bar or rail  Toileting assist Assist level: Touching or steadying assistance (Pt.75%)   Transfers Chair/bed transfer   Chair/bed  transfer method: Stand pivot Chair/bed transfer assist level: Touching or steadying assistance (Pt > 75%) Chair/bed transfer assistive device: Walker, Air cabin crew     Max distance: 40 ft Assist level: Touching or steadying assistance (Pt > 75%)   Wheelchair   Type: Manual Max wheelchair distance: 50 ft Assist Level: Touching or steadying assistance (Pt > 75%)  Cognition Comprehension Comprehension assist level: Understands basic 90% of the time/cues < 10% of the time  Expression Expression assist level: Expresses basic 90% of the time/requires cueing < 10% of the time.  Social Interaction Social Interaction assist level: Interacts appropriately 90% of the time - Needs monitoring or encouragement for participation or interaction.  Problem Solving Problem solving assist level: Solves basic 90% of the time/requires cueing < 10% of the time  Memory Memory assist level: Recognizes or recalls 90% of the time/requires cueing < 10% of the time    Medical Problem List and Plan: 1.  Decreased functional mobility secondary to bilateral subdural hematomas with midline shift. Status post burr hole evacuation 03/09/2017   Cont CIR    Discussed case with physician at [employer] as well as case Freight [employer]er on 3/8 2.  DVT Prophylaxis/Anticoagulation: SCDs. Patient is ambulatory 3. Pain Management: Lyrica 50 mg twice a day, Hydrocodone and Robaxin as needed 4. Mood/Alzheimer's. Exelon patch 9.5 mg daily 5. Neuropsych: This patient is not fully capable of making decisions on his own behalf. 6. Skin/Wound Care: Routine skin checks 7. Fluids/Electrolytes/Nutrition: Routine I&O's    BUN/CR trending up on 3/4    Encourage fluids   Labs pending 8. Seizure prophylaxis. Keppra 500 mg twice a day, d/ced on 3/4 9. Diabetes mellitus peripheral neuropathy. Hemoglobin A1c 6.9. Check blood sugars before meals and at bedtime. Diabetic teaching   Glucophage 500 mg twice a day,  increased to 850 twice a day on 2/28, increased to 1000 BID on 3/4.   Amaryl 1 mg started on 3/7, increased to 2 mg on 3/9   CBG (last 3)  Recent Labs    03/25/17 1646 03/25/17 2100 03/26/17 0649  GLUCAP 137* 79 118*    Labile on 3/11 10. Hypertension. Altace 2.5 mg daily    Vitals:   03/25/17 1500 03/26/17 0135  BP: (!) 117/58 (!) 142/64  Pulse: 74 74  Resp: 16 17  Temp: 97.6 F (36.4 C) 98.3 F (36.8 C)  SpO2: 98% 97%    Labile on 3/11 11. Hyperlipidemia. Lipitor/Zetia 12. Constipation. Laxative assistance 13. Hypoalbuminemia   Supplement initiated on 2/27 14. ABLA: Resolved   Hb 13.9 on 3/4   Cont to monitor 15.  Urinary incont with urgency    Timed toileting  Flomax started on 2/7   UA unremarkable  PVRs ?improving    LOS (Days) 13 A FACE TO FACE EVALUATION WAS PERFORMED  Christina Gintz Lorie Phenix 03/26/2017 8:19 AM

## 2017-03-26 NOTE — Progress Notes (Signed)
Social Work Patient ID: Daniel [name], male   DOB: 01-May-1935, 82 y.o.   MRN: [id number]  Have received notification from [facility] [employer] that they will NOT cover SNF level of care as they feel pt is at a "custodial" level.  Wife has been notified and she wishes still to pursue placement and is prepared to pay facility privately until his long term care policy becomes active.  FL2 sent out to area facilities.    Of note, therapies reporting pt better over the weekend.  Await additional reports today and, if pt's overall picture appears much better, will ask insurance to review again to determine if they might reconsider coverage of SNF.  Will keep team posted.  Korrine Sicard, LCSW

## 2017-03-26 NOTE — Progress Notes (Addendum)
Occupational Therapy Session Note  Patient Details  Name: Daniel [name] MRN: [id number] Date of Birth: [birth year]  Today's Date: 03/26/2017 OT Individual Time: 4496-7591 OT Individual Time Calculation (min): 60 min    Short Term Goals: Week 2:  OT Short Term Goal 1 (Week 2): Pt will complete Sit<>stand with consistent Mod A in preparation for BADL tasks OT Short Term Goal 2 (Week 2): Pt will complete toilet transfers to 3 in 1 over toilet with Mod A to reduce caregiver burden  OT Short Term Goal 3 (Week 2): Pt will complete LB dressing with Mod A and Mod cues for sequencing  Skilled Therapeutic Interventions/Progress Updates:    Pt received in bed and declined shower today as he said he only showers every 2-3 days at home due to dry skin.  Pt did say he needed to change his pants.  Pt was in a brief and he stated that he did not think it was wet, but it was quite soaked.  He sat to EOB with S.  Sit to stand from lowest bed height with mod A but then could stand with min A once bed elevated so his hips were slightly higher than his hips.  Pt worked on standing while self cleansing his perineal area and bottom with close S to steadying A with RW.  He donned his pants with S.  His hips were too stiff to cross his legs so he needed A with donning socks (should try a sock aid next session) and donning shoes. Pt used a long shoe horn with mod A.   Pt agreeable to practicing toilet transfers although he is convinced he will rarely use it due to bowel incontinence and he prefers the urinal.  Education with pt that continence may develop as pt works on his mobility.  A BSC place over toilet. Pt ambulated with RW to toilet with touching A and then was able to sit to stand with S from the toilet.  He then ambulated to sink to brush teeth, he requested to sit in w/c as 'that is how he does it".  Tried to remind pt that he was able to stand and brush his teeth the other day. Pt still wanted to sit. Sat in w/c  with quick release belt on.  Pt was very talkative this entire session and telling many stories.  Pt sitting at sink while still working on his teeth. RN came to room to A pt.  Overall, pt is demonstrating great improvement in alertness, cognition, participation, mobility and balance.   Therapy Documentation Precautions:  Precautions Precautions: Fall Restrictions Weight Bearing Restrictions: No  Pain: Pain Assessment Pain Assessment: No/denies pain Pain Score: 4  Pain Type: Neuropathic pain Pain Location: Hip(sciatic pain) Pain Orientation: Left Pain Descriptors / Indicators: Aching Pain Onset: With Activity Pain Intervention(s): Rest ADL: ADL ADL Comments: Please see functional navigator  See Function Navigator for Current Functional Status.   Therapy/Group: Individual Therapy  [place] 03/26/2017, 10:31 AM

## 2017-03-26 NOTE — Progress Notes (Signed)
Occupational Therapy Session Note  Patient Details  Name: Daniel [name] MRN: [id number] Date of Birth: Jul 27, 1935  Today's Date: 03/26/2017 OT Individual Time: 6962-9528 OT Individual Time Calculation (min): 90 min   Short Term Goals: Week 2:  OT Short Term Goal 1 (Week 2): Pt will complete Sit<>stand with consistent Mod A in preparation for BADL tasks OT Short Term Goal 2 (Week 2): Pt will complete toilet transfers to 3 in 1 over toilet with Mod A to reduce caregiver burden  OT Short Term Goal 3 (Week 2): Pt will complete LB dressing with Mod A and Mod cues for sequencing  Skilled Therapeutic Interventions/Progress Updates:    Pt greeted supine in bed, asleep, easily woken and agreeable to therapy. Pt completed toilet transfer at ambulatory level with RW and steady assist. Able to manage clothing himself x2 while standing near elevated toilet. Afterwards pt self propelled from room to ramp in [employer] to increase UB strength/activity tolerance. He was escorted remainder of way outside due to fatigue and c/o arthritic pain in hands. Worked on dynamic balance and functional ambulation with device while in outdoor community setting. Pt navigating over uneven surfaces and around environmental barriers with Min A. Cues required for promoting upright postural alignment and to stay inside of walker. Sit<stand from community bench completed with extra time and Mod A with manual cues for forward weight shifting. Increased strength/coordination challenge with having pt maneuver w/c in similar terrain afterwards, and required overall Mod A. Once back in [facility], he self propelled up ramp with extra time. Escorted him remainder of way back due to fatigue. Pt then transferred back to bed and transitioned to supine. Assisted him with repositioning for comfort and ensuring all needs were beside him. Pt left with bed alarm set.   Therapy Documentation Precautions:  Precautions Precautions:  Fall Restrictions Weight Bearing Restrictions: No Vital Signs: Therapy Vitals Temp: 97.7 F (36.5 C) Temp Source: Oral Pulse Rate: 76 Resp: 16 BP: 119/65 Patient Position (if appropriate): Lying Oxygen Therapy SpO2: 97 % O2 Device: Room Air Pain: C/o arthritic hand pain and pain in buttocks when sitting on bench    ADL: ADL ADL Comments: Please see functional navigator     See Function Navigator for Current Functional Status.   Therapy/Group: Individual Therapy  Estalene Bergey A Jamerion Cabello 03/26/2017, 3:44 PM

## 2017-03-26 NOTE — Progress Notes (Signed)
Physical Therapy Note  Patient Details  Name: Daniel [name] MRN: [id number] Date of Birth: 05/05/1935 Today's Date: 03/26/2017    Time: 1025-1121 56 minutes  1:1 Pt c/o pain in buttocks 3/10, relieves with position changes.  Pt performs sit to stand from w/c with min A.  Gait 50' x 2 with focus on turns and safety with turning to sit with supervision with RW, min cues.  Sit to stand training from declining height surfaces starting at 25'' and progressing down to 22.5''.  Pt able to stand from all these levels with supervision.  At 36'' pt requires min A to stand.  Pt performs nustep x 8 minutes level 4 for UE/LE strength and endurance. Pt able to stand from nustep with supervision and increased time. Pt much improved vs last week. Pt able to perform transfers at min A/supervision level versus mod/max A last week.  Pt left in room in w/c with quick release belt donned and needs at hand.   Stephaney Steven 03/26/2017, 11:25 AM

## 2017-03-26 NOTE — Plan of Care (Signed)
  Progressing RH BLADDER ELIMINATION RH STG MANAGE BLADDER WITH ASSISTANCE Description STG Manage Bladder With Assistance min  03/26/2017 1429 - Progressing by Glean Salen, RN Flowsheets Taken 03/26/2017 1429  STG: Pt will manage bladder with assistance 4-Minimal assistance RH SKIN INTEGRITY RH STG SKIN FREE OF INFECTION/BREAKDOWN Description Skin free of infection/breakdown with min assistance  03/26/2017 1429 - Progressing by Glean Salen, RN RH STG MAINTAIN SKIN INTEGRITY WITH ASSISTANCE Description STG Maintain Skin Integrity With min Assistance.  03/26/2017 1429 - Progressing by Glean Salen, RN Flowsheets Taken 03/26/2017 1429  STG: Maintain skin integrity with assistance 6-Modified independent RH STG ABLE TO PERFORM INCISION/WOUND CARE W/ASSISTANCE Description STG Able To Perform Incision/Wound Care With min Assistance.  03/26/2017 1429 - Progressing by Glean Salen, RN Flowsheets Taken 03/26/2017 1429  STG: Pt will be able to perform incision/wound care with assistance 6-Modified independent RH SAFETY RH STG ADHERE TO SAFETY PRECAUTIONS W/ASSISTANCE/DEVICE Description STG Adhere to Safety Precautions With supervision Assistance/Device.  03/26/2017 1429 - Progressing by Glean Salen, RN Flowsheets Taken 03/26/2017 1429  STG:Pt will adhere to safety precautions with assistance/device 4-Minimal assistance RH STG DECREASED RISK OF FALL WITH ASSISTANCE Description STG Decreased Risk of Fall With supervision Assistance.  03/26/2017 1429 - Progressing by Glean Salen, RN Flowsheets Taken 03/26/2017 1429  KLK:[id number] risk of fall  with assistance/device 4-Minimal assistance RH COGNITION-NURSING RH STG USES MEMORY AIDS/STRATEGIES W/ASSIST TO PROBLEM SOLVE Description STG Uses Memory Aids/Strategies With mod Assistance to Problem Solve.  03/26/2017 1429 - Progressing by Glean Salen, RN Flowsheets Taken 03/26/2017 1429  STG: Uses memory aids/strategies with  assistance 5-Supervision/cueing RH STG ANTICIPATES NEEDS/CALLS FOR ASSIST W/ASSIST/CUES Description STG Anticipates Needs/Calls for Assist With min Assistance/Cues.  03/26/2017 1429 - Progressing by Glean Salen, RN Flowsheets Taken 03/26/2017 1429  STG: Anticipates needs/calls for assistance with assistance/cues 5-Supervision/cueing RH PAIN MANAGEMENT RH STG PAIN MANAGED AT OR BELOW PT'S PAIN GOAL Description Pain < or = 4 with min assistance  03/26/2017 1429 - Progressing by Glean Salen, RN

## 2017-03-27 ENCOUNTER — Inpatient Hospital Stay (HOSPITAL_COMMUNITY): Payer: PPO | Admitting: Physical Therapy

## 2017-03-27 ENCOUNTER — Inpatient Hospital Stay (HOSPITAL_COMMUNITY): Payer: PPO | Admitting: Occupational Therapy

## 2017-03-27 DIAGNOSIS — D72829 Elevated white blood cell count, unspecified: Secondary | ICD-10-CM

## 2017-03-27 LAB — GLUCOSE, CAPILLARY
GLUCOSE-CAPILLARY: 100 mg/dL — AB (ref 65–99)
GLUCOSE-CAPILLARY: 137 mg/dL — AB (ref 65–99)
Glucose-Capillary: 126 mg/dL — ABNORMAL HIGH (ref 65–99)
Glucose-Capillary: 95 mg/dL (ref 65–99)

## 2017-03-27 MED ORDER — GLIMEPIRIDE 2 MG PO TABS: 2.0000 mg | ORAL_TABLET | Freq: Every day | ORAL | Status: DC

## 2017-03-27 MED ORDER — TAMSULOSIN HCL 0.4 MG PO CAPS: 0.4000 mg | ORAL_CAPSULE | Freq: Every day | ORAL | Status: DC

## 2017-03-27 MED ORDER — METFORMIN HCL 1000 MG PO TABS: 1000.0000 mg | ORAL_TABLET | Freq: Two times a day (BID) | ORAL | Status: DC

## 2017-03-27 MED ORDER — LYRICA 50 MG PO CAPS: 50.0000 mg | ORAL_CAPSULE | Freq: Two times a day (BID) | ORAL | 0 refills | Status: DC

## 2017-03-27 MED ORDER — CALCIUM CARBONATE-VITAMIN D 500-200 MG-UNIT PO TABS: 1.0000 | ORAL_TABLET | Freq: Every day | ORAL | Status: AC

## 2017-03-27 MED ORDER — RIVASTIGMINE 9.5 MG/24HR TD PT24: 9.5000 mg | MEDICATED_PATCH | Freq: Every day | TRANSDERMAL | 0 refills | Status: DC

## 2017-03-27 MED ORDER — HYDROCODONE-ACETAMINOPHEN 5-325 MG PO TABS: 1.0000 | ORAL_TABLET | ORAL | 0 refills | Status: DC | PRN

## 2017-03-27 MED ORDER — METHOCARBAMOL 500 MG PO TABS: 500.0000 mg | ORAL_TABLET | Freq: Four times a day (QID) | ORAL | 0 refills | Status: DC | PRN

## 2017-03-27 MED ORDER — EZETIMIBE 10 MG PO TABS: 10.0000 mg | ORAL_TABLET | Freq: Every evening | ORAL | 3 refills | Status: DC

## 2017-03-27 NOTE — Progress Notes (Signed)
Physical Therapy Session Note  Patient Details  Name: Daniel [name] MRN: [id number] Date of Birth: [birth year]  Today's Date: 03/27/2017 PT Individual Time: 1040-1103 PT Individual Time Calculation (min): 23 min   Short Term Goals: Week 2:  PT Short Term Goal 1 (Week 2): =LTG  Skilled Therapeutic Interventions/Progress Updates:  Pt received in bed & agreeable to tx. Pt with c/o L sciatic pain but reports this is not new & rest breaks taken PRN. Pt reports need to void & elects to use urinal from bed level; pt with continent void. Pt requires assistance to fasten L side tabs on brief. Pt transfers supine>sitting EOB with bed rails & HOB significantly elevated. Pt requires assistance to thread pants on BLE and to pull them over hips. Pt transfers sit>stand with steady assist and cuing for more anterior weight shifting to assist; pt requires steady<>min assist for standing balance. Pt transfers to w/c with RW & steady assist with decreased eccentric control stand>sit. Pt is able to don socks with extra time and difficulty placing legs in figure 4 pattern 2/2 sciatic pain, which also caused pt to require assistance for donning tennis shoes. Provided pt with shoe horn but pt unable to manipulate AE to assist with donning shoes & ultimately requires max assist, as well as to tie laces. At end of session pt left in handoff to OT.  Therapy Documentation Precautions:  Precautions Precautions: Fall Restrictions Weight Bearing Restrictions: No   See Function Navigator for Current Functional Status.   Therapy/Group: Individual Therapy  Waunita Schooner 03/27/2017, 12:21 PM

## 2017-03-27 NOTE — Progress Notes (Signed)
Physical Therapy Note  Patient Details  Name: Daniel [name] MRN: [id number] Date of Birth: 10-14-1935 Today's Date: 03/27/2017    Time: 1300-1353 53 minutes  1:1 No c/o pain.  Pt performs gait with RW x 50' with supervision, min a for sit to stand.  Standing step ups to 4'' step, tap ups to 4'' step, hip abduction, hip extension all 2 x 10.  Seated with 3# weighted bar 15 x shoulder press, chest press, bicep curl, circles CW/CCW, shoulder horiz abd/add.  Pt propels w/c back to room with supervision, increased time.   Zikeria Keough 03/27/2017, 1:49 PM

## 2017-03-27 NOTE — Discharge Summary (Signed)
NAMEMarland Kitchen  Daniel [name], Daniel [name] NO.:  [id number]  MEDICAL RECORD NO.:  [id number]  LOCATION:  4M03C                        FACILITY:  [facility]  PHYSICIAN:  Daniel Lesch, MD        DATE OF BIRTH:  Sep 28, 1935  DATE OF ADMISSION:  03/13/2017 DATE OF DISCHARGE:  03/28/2017                              DISCHARGE SUMMARY   DISCHARGE DIAGNOSES: 1. Bilateral subdural hematomas with midline shift, status post burr     hole evacuation March 09, 2017. 2. SCDs for DVT prophylaxis. 3. Pain management. 4. Alzheimer disease. 5. Seizure prophylaxis. 6. Diabetes mellitus. 7. Peripheral neuropathy. 8. Hypertension. 9. Hyperlipidemia. 10.Constipation. 11.Acute blood loss anemia. 12.Urinary incontinence. 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  HISTORY OF PRESENT ILLNESS:  This 82 year old right-handed male with history of Alzheimer disease, diabetes mellitus, hypertension, as well as history of subdural hygroma monitored since November 2018.  Lives with spouse and son.  Reported to be independent prior to admission. Presented March 06, 2017, for shuffling gait instability generalized decline over 10-14 days.  Followup cranial CT scan showed bilateral subdural hematomas, bilateral acute-on-chronic subdural hematomas, more prominent acute component in the left subdural hematoma.  There was a 4 mm left to right midline shift.  Underwent bilateral burr hole evacuation, chronic subdural hematoma on March 09, 2017, per Dr. Kathyrn [name].  Followup cranial CT scan showed some volume right extra- axial pneumocephalus measuring 3 cm transaxial dimension.  Continue to monitor.  No further surgical intervention was needed.  Maintained on Keppra for seizure prophylaxis.  Physical and occupational therapy ongoing.  The patient was admitted for a comprehensive rehab program.  PAST MEDICAL HISTORY:  See discharge diagnoses.  SOCIAL HISTORY:  Lives with spouse, as well as son.  FUNCTIONAL STATUS UPON  ADMISSION TO REHAB SERVICES:  Moderate assist for stand, pivot transfers, moderate assist side lying to sitting.  Max total assist activities of daily living.  PHYSICAL EXAMINATION:  VITAL SIGNS:  Blood pressure 151/71, pulse 64, temperature 97, and respirations 19. GENERAL:  This was an alert male, in no acute distress.  Bilateral burr hole sites were clean and dry. HEENT:  EOMs intact. NECK:  Supple.  Nontender.  No JVD. CARDIAC:  Rate controlled. ABDOMEN:  Soft, nontender.  Good bowel sounds. LUNGS:  Clear to auscultation without wheeze. NEURO:  He followed simple commands. EXTREMITIES:  Bilateral upper extremities 4/5.  Bilateral lower extremities 3/5 at hip flexors, 4-/5 knee extension, 4+/5 ankle dorsi plantar flexion.  REHABILITATION HOSPITAL COURSE:  The patient was admitted to Inpatient Rehab Services with therapies initiated on a 80-hour daily basis, consisting of physical therapy, occupational therapy, and rehabilitation nursing.  The following issues were addressed during the patient's rehabilitation stay.  Pertaining to [name] bilateral subdural hematomas.  He had undergone burr hole evacuation.  He would follow up with Neurosurgery.  He remained on SCDs for DVT prophylaxis.  Noted history of Alzheimer disease, he remained on Exelon patch daily. Patient appeared to be close to baseline.  Pain management with the use of Lyrica 50 mg twice daily as well as hydrocodone and Robaxin as needed.  The patient with no seizure noted; his Keppra was discontinued March 19, 2017.  Diabetes mellitus, peripheral neuropathy.  Hemoglobin A1c of 6.9, he remained on Glucophage 1000 mg twice daily, Amaryl was increased to 2 mg daily.  Blood pressures overall controlled on Altace. Bouts of constipation resolved with laxative assistance.  The patient did have a stage II left buttocks followed by wound care nurse with foam dressing to the buttocks with silicone foam change every 3 days  as needed.  The patient received weekly collaborative interdisciplinary team conferences to discuss estimated length of stay, family teaching, any barriers to discharge.  He was ambulating 50 feet, supervision rolling walker, minimal assist sit to stand, standing steps of 4 inches, activities of daily living, and homemaking.  Working with energy conservation techniques.  He would sit at the edge of the bed with supervision.  Needing some assistance for lower body dressing.  He could don his pants with supervision.  After long discussions with family felt skilled nursing facility was needed, they could not provide the necessary physical assistance and discharge was set for March 28, 2017.  DISCHARGE MEDICATIONS: 1. Lipitor 40 mg p.o. daily. 2. Os-Cal one tablet daily. 3. Vitamin D 2000 units p.o. daily. 4. Zetia 10 mg p.o. daily. 5. Amaryl 2 mg at breakfast. 6. Glucophage 1000 mg p.o. b.i.d. 7. Multivitamin 1 tablet p.o. daily. 8. Protonix 40 mg p.o. at bedtime. 9. Lyrica 50 mg p.o. b.i.d. 10.Altace 2.5 mg p.o. daily. 11.Exelon patch 9.5 mg daily. 12.Senokot 1 tablet p.o. b.i.d. 13.Flomax 0.4 mg p.o. daily. 14.Hydrocodone 1 tablet every 4 hours as needed pain. 15.Robaxin 500 mg p.o. every 6 as needed muscle spasms.  DIET:  1800 calorie ADA diet.  FOLLOWUP:  The patient would follow up with Dr. Delice [name] at the Outpatient Rehab Service office as directed; Dr. Kathyrn [name], Neurosurgery call for appointment.  SPECIAL INSTRUCTIONS:  Foam dressing to the buttocks with silicone foam. Change every 3 days as needed.     Lauraine Rinne, P.A.   ______________________________ Daniel Lesch, MD    DA/MEDQ  D:  03/27/2017  T:  03/27/2017  Job:  [id number]  cc:   Lynnell Chad. Shelia Media, M.D. Daniel Lesch, MD Dr. Kathyrn [name]

## 2017-03-27 NOTE — Progress Notes (Signed)
[place] PHYSICAL MEDICINE & REHABILITATION     PROGRESS NOTE  Subjective/Complaints:  Pt seen sitting up in bed this AM reading the newspaper.  He states he slept well overnight.    ROS: Denies CP, SOB, N/V/D.  Objective: Vital Signs: Blood pressure 131/66, pulse 64, temperature (!) 97.5 F (36.4 C), temperature source Oral, resp. rate 15, weight 75.4 kg (166 lb 3.6 oz), SpO2 98 %. No results found. Recent Labs    03/26/17 1204  WBC 11.2*  HGB 13.6  HCT 42.2  PLT 334   Recent Labs    03/26/17 1204  NA 136  K 4.1  CL 99*  GLUCOSE 129*  BUN 20  CREATININE 0.86  CALCIUM 10.2   CBG (last 3)  Recent Labs    03/26/17 1634 03/26/17 2111 03/27/17 0609  GLUCAP 128* 124* 100*    Wt Readings from Last 3 Encounters:  03/20/17 75.4 kg (166 lb 3.6 oz)  03/09/17 70.3 kg (155 lb)  11/16/16 70.3 kg (155 lb)    Physical Exam:  BP 131/66 (BP Location: Right Arm)   Pulse 64   Temp (!) 97.5 F (36.4 C) (Oral)   Resp 15   Wt 75.4 kg (166 lb 3.6 oz)   SpO2 98%   BMI 21.93 kg/m  Constitutional: He appears well-developed and well-nourished.  HENT: Bilateral burr hole sites c/d/i Eyes: EOM are normal. No discharge.  Cardiovascular: RRR. No JVD. Respiratory: Effort normal and breath sounds normal.  GI: Bowel sounds are normal. Nondistended Musculoskeletal: No edema or tenderness in extremities  Neurological: He is alert.  Makes good eye contact with examiner.  Follows simple commands.  Motor: B/l UE: 4/5  B/l LE: HF 4/5, KE 4/5, ADF/PF 4+/5 (unchanged) Skin: See above  Psychiatric: He has a normal mood and affect. His behavior is normal.   Assessment/Plan: 1. Functional deficits secondary to bilateral subdural hematomas with midline shift. Status post burr hole evacuation 03/09/2017 which require 3+ hours per day of interdisciplinary therapy in a comprehensive inpatient rehab setting. Physiatrist is providing close team supervision and 24 hour management of active  medical problems listed below. Physiatrist and rehab team continue to assess barriers to discharge/monitor patient progress toward functional and medical goals.  Function:  Bathing Bathing position   Position: Wheelchair/chair at sink  Bathing parts Body parts bathed by patient: Right arm, Left arm, Chest, Abdomen, Front perineal area, Right upper leg, Left upper leg, Right lower leg, Left lower leg, Buttocks Body parts bathed by helper: Back  Bathing assist Assist Level: Touching or steadying assistance(Pt > 75%)      Upper Body Dressing/Undressing Upper body dressing   What is the patient wearing?: Pull over shirt/dress     Pull over shirt/dress - Perfomed by patient: Thread/unthread right sleeve, Thread/unthread left sleeve, Put head through opening Pull over shirt/dress - Perfomed by helper: Pull shirt over trunk        Upper body assist Assist Level: (Min assist)      Lower Body Dressing/Undressing Lower body dressing   What is the patient wearing?: Pants, Socks, Shoes     Pants- Performed by patient: Pull pants up/down, Thread/unthread right pants leg, Thread/unthread left pants leg Pants- Performed by helper: Thread/unthread left pants leg Non-skid slipper socks- Performed by patient: Don/doff right sock Non-skid slipper socks- Performed by helper: Don/doff left sock   Socks - Performed by helper: Don/doff right sock, Don/doff left sock   Shoes - Performed by helper: Don/doff right shoe, Don/doff  left shoe, Fasten right, Fasten left          Lower body assist Assist for lower body dressing: (Max assist)      Toileting Toileting   Toileting steps completed by patient: Adjust clothing prior to toileting Toileting steps completed by helper: Performs perineal hygiene, Adjust clothing after toileting Toileting Assistive Devices: Grab bar or rail  Toileting assist Assist level: Touching or steadying assistance (Pt.75%)   Transfers Chair/bed transfer   Chair/bed  transfer method: Ambulatory Chair/bed transfer assist level: Touching or steadying assistance (Pt > 75%) Chair/bed transfer assistive device: Walker, Air cabin crew     Max distance: 40 ft Assist level: Touching or steadying assistance (Pt > 75%)   Wheelchair   Type: Manual Max wheelchair distance: 50 ft Assist Level: Touching or steadying assistance (Pt > 75%)  Cognition Comprehension Comprehension assist level: Understands basic 90% of the time/cues < 10% of the time  Expression Expression assist level: Expresses basic needs/ideas: With no assist  Social Interaction Social Interaction assist level: Interacts appropriately 90% of the time - Needs monitoring or encouragement for participation or interaction.  Problem Solving Problem solving assist level: Solves basic problems with no assist  Memory Memory assist level: Recognizes or recalls 90% of the time/requires cueing < 10% of the time    Medical Problem List and Plan: 1.  Decreased functional mobility secondary to bilateral subdural hematomas with midline shift. Status post burr hole evacuation 03/09/2017   Cont CIR    Discussed case with physician at [facility] as well as case Fre[employer] on 3/8   Awaiting SNF 2.  DVT Prophylaxis/Anticoagulation: SCDs. Patient is ambulatory 3. Pain Management: Lyrica 50 mg twice a day, Hydrocodone and Robaxin as needed 4. Mood/Alzheimer's. Exelon patch 9.5 mg daily 5. Neuropsych: This patient is not fully capable of making decisions on his own behalf. 6. Skin/Wound Care: Routine skin checks 7. Fluids/Electrolytes/Nutrition: Routine I&O's    BUN/CR now stable on 3/11    Encourage fluids 8. Seizure prophylaxis. Keppra 500 mg twice a day, d/ced on 3/4 9. Diabetes mellitus peripheral neuropathy. Hemoglobin A1c 6.9. Check blood sugars before meals and at bedtime. Diabetic teaching   Glucophage 500 mg twice a day, increased to 850 twice a day on 2/28, increased  to 1000 BID on 3/4.   Amaryl 1 mg started on 3/7, increased to 2 mg on 3/9   CBG (last 3)  Recent Labs    03/26/17 1634 03/26/17 2111 03/27/17 0609  GLUCAP 128* 124* 100*    Improving on 3/12 10. Hypertension. Altace 2.5 mg daily    Vitals:   03/26/17 1400 03/27/17 0515  BP: 119/65 131/66  Pulse: 76 64  Resp: 16 15  Temp: 97.7 F (36.5 C) (!) 97.5 F (36.4 C)  SpO2: 97% 98%    Relatively controlled on 3/12 11. Hyperlipidemia. Lipitor/Zetia 12. Constipation. Laxative assistance 13. Hypoalbuminemia   Supplement initiated on 2/27 14. ABLA: Resolved   Cont to monitor 15.  Urinary incont with urgency    Timed toileting  Flomax started on 2/7   UA unremarkable  PVRs ?improving 16. Leukocytosis  WBCs 11.2 on 3/11   Cont to monitor    LOS (Days) 14 A FACE TO FACE EVALUATION WAS PERFORMED  Dakotah Orrego Lorie Phenix 03/27/2017 8:40 AM

## 2017-03-27 NOTE — Progress Notes (Signed)
Occupational Therapy Session Note  Patient Details  Name: Daniel [name] MRN: [id number] Date of Birth: [birth year]  Today's Date: 03/27/2017 OT Individual Time: 1100-1200 and 1400-1445 OT Individual Time Calculation (min): 60 min and 45 min   Short Term Goals: Week 2:  OT Short Term Goal 1 (Week 2): Pt will complete Sit<>stand with consistent Mod A in preparation for BADL tasks OT Short Term Goal 2 (Week 2): Pt will complete toilet transfers to 3 in 1 over toilet with Mod A to reduce caregiver burden  OT Short Term Goal 3 (Week 2): Pt will complete LB dressing with Mod A and Mod cues for sequencing  Skilled Therapeutic Interventions/Progress Updates:    1) Treatment session with focus on sit > stand and standing tolerance.  Pt received upright in w/c following PT session.  Pt propelled w/c to therapy gym with supervision for BUE strengthening.  Engaged in sit > stand from therapy mat progressing from 24" to 22" at supervision, requiring min guard at 21.5" and then min assist at 21".  Engaged in standing activity between each sit > stand to increase standing balance and tolerance while utilizing BUE to complete table top task, to decrease reliance on BUE for stability.  Pt ambulated from therapy gym back to room ~150' with RW and min guard.  Increased cues for RW positioning and upright posture as pt fatigued.  Left upright in recliner with quick release belt fastened and all needs in reach.  2) Treatment session with focus on LB dressing and sit > stand.  Pt received upright in w/c reporting fatigue from PT session.  Engaged in [place] with focus on donning/doffing socks and shoes.  Pt able to doff and don sock and shoe on Rt foot in figure 4 position and with use of shoe horn to aid in donning shoe.  Pt demonstrated increased difficulty with Lt foot secondary to pain in sciatic nerve with bending and attempts and crossing leg.  Pt reports he would ask his son or wife for assistance when he was  experiencing pain.  Engaged in sit > stand from w/c with pt requiring mod assist secondary to height of w/c and due to fatigue.  Ambulated to bed with RW with min assist and transferred to bed with min guard.  Pt doffed pants and shoes at edge of bed with assist to doff Lt pant leg and shoe.  Returned to bed and left semi-reclined with all needs in reach.  Therapy Documentation Precautions:  Precautions Precautions: Fall Restrictions Weight Bearing Restrictions: No General:   Vital Signs: Therapy Vitals BP: 118/60 Pain: Pain Assessment Pain Assessment: No/denies pain  See Function Navigator for Current Functional Status.   Therapy/Group: Individual Therapy  [name] 03/27/2017, 12:10 PM

## 2017-03-27 NOTE — Progress Notes (Signed)
Social Work Patient ID: Daniel [name], male   DOB: September 27, 1935, 82 y.o.   MRN: [id number]   Have received SNF bed offer from [facility] who can admit pt tomorrow.  Have discussed with pt and wife and they are accepting offer.  Have also discussed with Dr. Ellard Artis, Medical Director with [facility], who notes that, with pt's progress the past few days, insurance will offer SNF coverage. Alerting tx team.  Lennart Pall, LCSW

## 2017-03-27 NOTE — Discharge Instructions (Signed)
Inpatient Rehab Discharge Instructions  Daniel [name] Discharge date and time: No discharge date for patient encounter.   Activities/Precautions/ Functional Status: Activity: activity as tolerated Diet: diabetic diet Wound Care: keep wound clean and dry Functional status:  ___ No restrictions     ___ Walk up steps independently ___ 24/7 supervision/assistance   ___ Walk up steps with assistance ___ Intermittent supervision/assistance  _x__ Bathe/dress independently ___ Walk with walker     ___ Bathe/dress with assistance ___ Walk Independently    ___ Shower independently ___ Walk with assistance    ___ Shower with assistance ___ No alcohol     ___ Return to work/school ________  Special Instructions:    My questions have been answered and I understand these instructions. I will adhere to these goals and the provided educational materials after my discharge from the hospital.  Patient/Caregiver Signature _______________________________ Date __________  Clinician Signature _______________________________________ Date __________  Please bring this form and your medication list with you to all your follow-up doctor's appointments.

## 2017-03-27 NOTE — Discharge Summary (Signed)
Discharge summary job [phone]

## 2017-03-28 ENCOUNTER — Inpatient Hospital Stay (HOSPITAL_COMMUNITY): Payer: PPO

## 2017-03-28 ENCOUNTER — Inpatient Hospital Stay (HOSPITAL_COMMUNITY): Payer: PPO | Admitting: Occupational Therapy

## 2017-03-28 DIAGNOSIS — E1142 Type 2 diabetes mellitus with diabetic polyneuropathy: Secondary | ICD-10-CM | POA: Diagnosis not present

## 2017-03-28 DIAGNOSIS — R413 Other amnesia: Secondary | ICD-10-CM | POA: Diagnosis not present

## 2017-03-28 DIAGNOSIS — F0150 Vascular dementia without behavioral disturbance: Secondary | ICD-10-CM | POA: Diagnosis not present

## 2017-03-28 DIAGNOSIS — G936 Cerebral edema: Secondary | ICD-10-CM | POA: Diagnosis not present

## 2017-03-28 DIAGNOSIS — I10 Essential (primary) hypertension: Secondary | ICD-10-CM | POA: Diagnosis not present

## 2017-03-28 DIAGNOSIS — R52 Pain, unspecified: Secondary | ICD-10-CM | POA: Diagnosis not present

## 2017-03-28 DIAGNOSIS — Z111 Encounter for screening for respiratory tuberculosis: Secondary | ICD-10-CM | POA: Diagnosis not present

## 2017-03-28 DIAGNOSIS — K5900 Constipation, unspecified: Secondary | ICD-10-CM | POA: Diagnosis not present

## 2017-03-28 DIAGNOSIS — R4182 Altered mental status, unspecified: Secondary | ICD-10-CM | POA: Diagnosis not present

## 2017-03-28 DIAGNOSIS — F0390 Unspecified dementia without behavioral disturbance: Secondary | ICD-10-CM | POA: Diagnosis not present

## 2017-03-28 DIAGNOSIS — N400 Enlarged prostate without lower urinary tract symptoms: Secondary | ICD-10-CM | POA: Diagnosis not present

## 2017-03-28 DIAGNOSIS — R2681 Unsteadiness on feet: Secondary | ICD-10-CM | POA: Diagnosis not present

## 2017-03-28 DIAGNOSIS — S065X9A Traumatic subdural hemorrhage with loss of consciousness of unspecified duration, initial encounter: Secondary | ICD-10-CM | POA: Diagnosis not present

## 2017-03-28 DIAGNOSIS — N401 Enlarged prostate with lower urinary tract symptoms: Secondary | ICD-10-CM | POA: Diagnosis not present

## 2017-03-28 DIAGNOSIS — G301 Alzheimer's disease with late onset: Secondary | ICD-10-CM | POA: Diagnosis not present

## 2017-03-28 DIAGNOSIS — R339 Retention of urine, unspecified: Secondary | ICD-10-CM | POA: Diagnosis not present

## 2017-03-28 DIAGNOSIS — R2689 Other abnormalities of gait and mobility: Secondary | ICD-10-CM | POA: Diagnosis not present

## 2017-03-28 DIAGNOSIS — E785 Hyperlipidemia, unspecified: Secondary | ICD-10-CM | POA: Diagnosis not present

## 2017-03-28 DIAGNOSIS — Z23 Encounter for immunization: Secondary | ICD-10-CM | POA: Diagnosis not present

## 2017-03-28 DIAGNOSIS — E119 Type 2 diabetes mellitus without complications: Secondary | ICD-10-CM | POA: Diagnosis not present

## 2017-03-28 DIAGNOSIS — R7989 Other specified abnormal findings of blood chemistry: Secondary | ICD-10-CM | POA: Diagnosis not present

## 2017-03-28 DIAGNOSIS — M6281 Muscle weakness (generalized): Secondary | ICD-10-CM | POA: Diagnosis not present

## 2017-03-28 DIAGNOSIS — F4323 Adjustment disorder with mixed anxiety and depressed mood: Secondary | ICD-10-CM | POA: Diagnosis not present

## 2017-03-28 DIAGNOSIS — I6200 Nontraumatic subdural hemorrhage, unspecified: Secondary | ICD-10-CM | POA: Diagnosis not present

## 2017-03-28 DIAGNOSIS — S065X0A Traumatic subdural hemorrhage without loss of consciousness, initial encounter: Secondary | ICD-10-CM | POA: Diagnosis not present

## 2017-03-28 DIAGNOSIS — G9340 Encephalopathy, unspecified: Secondary | ICD-10-CM | POA: Diagnosis not present

## 2017-03-28 DIAGNOSIS — M62830 Muscle spasm of back: Secondary | ICD-10-CM | POA: Diagnosis not present

## 2017-03-28 DIAGNOSIS — R32 Unspecified urinary incontinence: Secondary | ICD-10-CM | POA: Diagnosis not present

## 2017-03-28 DIAGNOSIS — F0280 Dementia in other diseases classified elsewhere without behavioral disturbance: Secondary | ICD-10-CM | POA: Diagnosis not present

## 2017-03-28 DIAGNOSIS — G9009 Other idiopathic peripheral autonomic neuropathy: Secondary | ICD-10-CM | POA: Diagnosis not present

## 2017-03-28 DIAGNOSIS — R7309 Other abnormal glucose: Secondary | ICD-10-CM | POA: Diagnosis not present

## 2017-03-28 DIAGNOSIS — E569 Vitamin deficiency, unspecified: Secondary | ICD-10-CM | POA: Diagnosis not present

## 2017-03-28 DIAGNOSIS — R05 Cough: Secondary | ICD-10-CM | POA: Diagnosis not present

## 2017-03-28 DIAGNOSIS — M62838 Other muscle spasm: Secondary | ICD-10-CM | POA: Diagnosis not present

## 2017-03-28 DIAGNOSIS — F419 Anxiety disorder, unspecified: Secondary | ICD-10-CM | POA: Diagnosis not present

## 2017-03-28 DIAGNOSIS — K219 Gastro-esophageal reflux disease without esophagitis: Secondary | ICD-10-CM | POA: Diagnosis not present

## 2017-03-28 LAB — GLUCOSE, CAPILLARY
Glucose-Capillary: 112 mg/dL — ABNORMAL HIGH (ref 65–99)
Glucose-Capillary: 124 mg/dL — ABNORMAL HIGH (ref 65–99)

## 2017-03-28 NOTE — Progress Notes (Signed)
[place] PHYSICAL MEDICINE & REHABILITATION     PROGRESS NOTE  Subjective/Complaints:  Patient seen lying in bed this morning.  He slept well overnight. He is aware that he is being discharged to SNF today.   ROS: Denies CP, SOB, N/V/D.  Objective: Vital Signs: Blood pressure (!) 120/57, pulse 76, temperature 97.8 F (36.6 C), temperature source Oral, resp. rate 17, weight 76 kg (167 lb 8.8 oz), SpO2 97 %. No results found. Recent Labs    03/26/17 1204  WBC 11.2*  HGB 13.6  HCT 42.2  PLT 334   Recent Labs    03/26/17 1204  NA 136  K 4.1  CL 99*  GLUCOSE 129*  BUN 20  CREATININE 0.86  CALCIUM 10.2   CBG (last 3)  Recent Labs    03/27/17 1638 03/27/17 2114 03/28/17 0651  GLUCAP 137* 95 112*    Wt Readings from Last 3 Encounters:  03/28/17 76 kg (167 lb 8.8 oz)  03/09/17 70.3 kg (155 lb)  11/16/16 70.3 kg (155 lb)    Physical Exam:  BP (!) 120/57 (BP Location: Right Arm)   Pulse 76   Temp 97.8 F (36.6 C) (Oral)   Resp 17   Wt 76 kg (167 lb 8.8 oz)   SpO2 97%   BMI 22.11 kg/m  Constitutional: He appears well-developed and well-nourished.  HENT: Bilateral burr hole sites c/d/i Eyes: EOM are normal. No discharge.  Cardiovascular: RRR. No JVD. Respiratory: Effort normal and breath sounds normal.  GI: Bowel sounds are normal. Nondistended Musculoskeletal: No edema or tenderness in extremities  Neurological: He is alert.  Makes good eye contact with examiner.  Follows simple commands.  Motor: B/l UE: 4/5  B/l LE: HF 4/5, KE 4/5, ADF/PF 4+/5 (stable) Skin: See above  Psychiatric: He has a normal mood and affect. His behavior is normal.   Assessment/Plan: 1. Functional deficits secondary to bilateral subdural hematomas with midline shift. Status post burr hole evacuation 03/09/2017 which require 3+ hours per day of interdisciplinary therapy in a comprehensive inpatient rehab setting. Physiatrist is providing close team supervision and 24 hour  management of active medical problems listed below. Physiatrist and rehab team continue to assess barriers to discharge/monitor patient progress toward functional and medical goals.  Function:  Bathing Bathing position   Position: Wheelchair/chair at sink  Bathing parts Body parts bathed by patient: Right arm, Left arm, Chest, Abdomen, Front perineal area, Right upper leg, Left upper leg, Right lower leg, Left lower leg, Buttocks Body parts bathed by helper: Back  Bathing assist Assist Level: Touching or steadying assistance(Pt > 75%)      Upper Body Dressing/Undressing Upper body dressing   What is the patient wearing?: Pull over shirt/dress     Pull over shirt/dress - Perfomed by patient: Thread/unthread right sleeve, Thread/unthread left sleeve, Put head through opening Pull over shirt/dress - Perfomed by helper: Pull shirt over trunk        Upper body assist Assist Level: (Min assist)      Lower Body Dressing/Undressing Lower body dressing   What is the patient wearing?: Socks, Shoes     Pants- Performed by patient: Pull pants up/down, Thread/unthread right pants leg, Thread/unthread left pants leg Pants- Performed by helper: Thread/unthread right pants leg, Thread/unthread left pants leg, Pull pants up/down Non-skid slipper socks- Performed by patient: Don/doff right sock Non-skid slipper socks- Performed by helper: Don/doff left sock Socks - Performed by patient: Don/doff right sock, Don/doff left sock Socks - Performed  by helper: Don/doff right sock, Don/doff left sock   Shoes - Performed by helper: Don/doff right shoe, Don/doff left shoe, Fasten right, Fasten left          Lower body assist Assist for lower body dressing: (Max assist)      Toileting Toileting   Toileting steps completed by patient: Adjust clothing prior to toileting Toileting steps completed by helper: Performs perineal hygiene, Adjust clothing after toileting Toileting Assistive Devices:  Grab bar or rail  Toileting assist Assist level: Touching or steadying assistance (Pt.75%)   Transfers Chair/bed transfer   Chair/bed transfer method: Stand pivot Chair/bed transfer assist level: Touching or steadying assistance (Pt > 75%) Chair/bed transfer assistive device: Medical sales representative     Max distance: 50 Assist level: Supervision or verbal cues   Wheelchair   Type: Manual Max wheelchair distance: 50 ft Assist Level: Supervision or verbal cues  Cognition Comprehension Comprehension assist level: Understands basic 90% of the time/cues < 10% of the time  Expression Expression assist level: Expresses basic needs/ideas: With no assist  Social Interaction Social Interaction assist level: Interacts appropriately 90% of the time - Needs monitoring or encouragement for participation or interaction.  Problem Solving Problem solving assist level: Solves basic problems with no assist  Memory Memory assist level: Recognizes or recalls 90% of the time/requires cueing < 10% of the time    Medical Problem List and Plan: 1.  Decreased functional mobility secondary to bilateral subdural hematomas with midline shift. Status post burr hole evacuation 03/09/2017   D/c today to SNF    Will see patient for hospital follow up in 1 month 2.  DVT Prophylaxis/Anticoagulation: SCDs. Patient is ambulatory 3. Pain Management: Lyrica 50 mg twice a day, Hydrocodone and Robaxin as needed 4. Mood/Alzheimer's. Exelon patch 9.5 mg daily 5. Neuropsych: This patient is not fully capable of making decisions on his own behalf. 6. Skin/Wound Care: Routine skin checks 7. Fluids/Electrolytes/Nutrition: Routine I&O's    BUN/CR now stable on 3/11    Encourage fluids 8. Seizure prophylaxis. Keppra 500 mg twice a day, d/ced on 3/4 9. Diabetes mellitus peripheral neuropathy. Hemoglobin A1c 6.9. Check blood sugars before meals and at bedtime. Diabetic teaching   Glucophage 500 mg twice a day,  increased to 850 twice a day on 2/28, increased to 1000 BID on 3/4.   Amaryl 1 mg started on 3/7, increased to 2 mg on 3/9   CBG (last 3)  Recent Labs    03/27/17 1638 03/27/17 2114 03/28/17 0651  GLUCAP 137* 95 112*    Relatively controlled on 3/13 10. Hypertension. Altace 2.5 mg daily    Vitals:   03/27/17 1500 03/28/17 0045  BP: (!) 104/58 (!) 120/57  Pulse: 74 76  Resp: 18 17  Temp: 97.9 F (36.6 C) 97.8 F (36.6 C)  SpO2: 97% 97%    Relatively controlled on 3/13 11. Hyperlipidemia. Lipitor/Zetia 12. Constipation. Laxative assistance 13. Hypoalbuminemia   Supplement initiated on 2/27 14. ABLA: Resolved   Cont to monitor 15.  Urinary incont with urgency    Timed toileting  Flomax started on 2/7   UA unremarkable 16. Leukocytosis  WBCs 11.2 on 3/11   Cont to monitor    LOS (Days) 15 A FACE TO FACE EVALUATION WAS PERFORMED  Advik Weatherspoon Lorie Phenix 03/28/2017 8:20 AM

## 2017-03-28 NOTE — Progress Notes (Signed)
Occupational Therapy Session Note  Patient Details  Name: Daniel [name] MRN: [id number] Date of Birth: [birth year]  Today's Date: 03/28/2017 OT Individual Time: 1105-1200 OT Individual Time Calculation (min): 55 min    Short Term Goals: Week 2:  OT Short Term Goal 1 (Week 2): Pt will complete Sit<>stand with consistent Mod A in preparation for BADL tasks OT Short Term Goal 2 (Week 2): Pt will complete toilet transfers to 3 in 1 over toilet with Mod A to reduce caregiver burden  OT Short Term Goal 3 (Week 2): Pt will complete LB dressing with Mod A and Mod cues for sequencing  Skilled Therapeutic Interventions/Progress Updates:    Completed ADL retraining at overall Min assist level.  Pt received upright in w/c with wife present, preparing for transfer to SNF for additional rehab.  Pt completed bathing/dressing at sit > stand level at sink with mod encouragement.  Pt with improved ability to complete LB bathing/dressing tasks with increased time.  Completed sit <> stand from w/c with min assist progressing to supervision by end of session.  Ambulated to toilet with RW with min guard/close supervision.  Able to urinate in standing with supervision.  Completed toilet transfers to assess sit <> stand from [facility] over toilet to provide UE support and increase height of surface, with pt able to complete with supervision.  Mod cues for problem solving when donning shoes with shoe horn, but once set up pt able to complete without physical assist.  Grooming tasks completed in sitting per pt request.  Pt's wife present and observing throughout session, reports pleased with pt progress to this point.  Therapy Documentation Precautions:  Precautions Precautions: Fall Restrictions Weight Bearing Restrictions: No Pain:  Pt with no c/o pain  See Function Navigator for Current Functional Status.   Therapy/Group: Individual Therapy  [name] 03/28/2017, 1:09 PM

## 2017-03-28 NOTE — Progress Notes (Signed)
Occupational Therapy Discharge Summary  Patient Details  Name: Daniel [name] MRN: [id number] Date of Birth: 1935/07/13  Patient has met 8 of 9 long term goals due to improved activity tolerance, improved balance, postural control, ability to compensate for deficits, improved attention and improved awareness.  Patient to discharge at [facility] Assist level.  Patient's care partner unavailable to provide the necessary physical and cognitive assistance at discharge.  Pt has demonstrated tremendous progress over last ~week to overall Min Assist level, even demonstrating ability for sit > stand with supervision from higher surfaces.  Pt will benefit from continued therapy at SNF to decrease burden of care.  Reasons goals not met: Pt did not meet shower transfer goal due to decreased functional practice.  Pt was not medically cleared for shower until a few days ago and then pt reporting showering every ~3 days, therefore only completed one actual shower during stay.    Recommendation:  Patient will benefit from ongoing skilled OT services in skilled nursing facility setting to continue to advance functional skills in the area of BADL and Reduce care partner burden.  Equipment: No equipment provided  Reasons for discharge: treatment goals met and discharge from hospital  Patient/family agrees with progress made and goals achieved: Yes  OT Discharge Precautions/Restrictions  Precautions Precautions: Fall Restrictions Weight Bearing Restrictions: No Pain Pain Assessment Pain Assessment: No/denies pain ADL ADL ADL Comments: Please see functional navigator Vision Baseline Vision/History: Wears glasses Wears Glasses: At all times Patient Visual Report: No change from baseline Vision Assessment?: No apparent visual deficits Perception  Perception: Within Functional Limits Praxis Praxis: Intact Cognition Overall Cognitive Status: History of cognitive impairments - at  baseline Arousal/Alertness: Awake/alert Orientation Level: Oriented X4 Sustained Attention: Appears intact Selective Attention: Appears intact Alternating Attention: Impaired Memory: Impaired(baseline deficit, however pt demonstrated ability to recall strategies and familiar therapists over time/with repetition) Awareness: Appears intact Problem Solving: Impaired Problem Solving Impairment: Functional complex;Verbal complex Safety/Judgment: Appears intact Comments: memory, semi-complex problem solving and higher level attention baseline deficits with Alzheimers Sensation Sensation Light Touch: Appears Intact Stereognosis: Appears Intact Hot/Cold: Appears Intact Proprioception: Appears Intact Coordination Gross Motor Movements are Fluid and Coordinated: No Fine Motor Movements are Fluid and Coordinated: No Coordination and Movement Description: Slightly slower coordination Finger Nose Finger Test: dysmetria bilaterally Extremity/Trunk Assessment RUE Assessment RUE Assessment: Within Functional Limits(4-/5) LUE Assessment LUE Assessment: Within Functional Limits(4-/5)   See Function Navigator for Current Functional Status.  [name] 03/28/2017, 2:24 PM

## 2017-03-28 NOTE — Progress Notes (Signed)
Physical Therapy Note  Patient Details  Name: Daniel [name] MRN: [id number] Date of Birth: 20-Mar-1935 Today's Date: 03/28/2017  1000-1045, 45 min individual tx Pain: L sciatica, moderate amount, unrated, worse during sit> stand  Pt asleep but easy to awaken.  He was lethargic throughout session. Pt awaiting d/c to SNF.   Bed mobility in flat bed no rails with mod cues, supervision.  Sit> stand after demo and and mod cues, with mod assist, but LOB posteriorly upon standing.  Pt immediately stated that his L buttock pain "grabs" him upon standing and prevents him from standing all the way up.  Gait training on level tile x 50' with multiple turns, supervision.  Up/down 12 steps 2 rails, min guard assist, with self -selected step through method.  Pt limited by L sciatic pain.  Pt verbose throughout session, and time constraints prevented pt from addressing car transfer.  Pt left resting in w/c with quick release belt applied and all needs within reach; wife and ANF staff in room.  See function navigator for current status.  Victoria Euceda 03/28/2017, 8:59 AM

## 2017-03-28 NOTE — Progress Notes (Signed)
Report called to RN at [facility]. Patient belongings accounted for, transferred by ambulance. No complaints at this time.

## 2017-03-28 NOTE — Progress Notes (Signed)
Social Work  Discharge Note  The overall goal for the admission was met for:   Discharge location: No - plan changed to SNF as wife does not feel she can meet care needs  Length of Stay: Yes - 15 days  Discharge activity level: No - still require some mod assist  Home/community participation: No  Services provided included: MD, RD, PT, OT, SLP, RN, TR, Pharmacy and SW  Financial Services: Private Insurance: [employer]  Follow-up services arranged: Other: SNF at [facility]  Comments (or additional information):  Patient/Family verbalized understanding of follow-up arrangements: Yes  Individual responsible for coordination of the follow-up plan: pt/wife  Confirmed correct DME delivered: NA    Daniel [name]

## 2017-03-29 DIAGNOSIS — R52 Pain, unspecified: Secondary | ICD-10-CM | POA: Diagnosis not present

## 2017-03-29 DIAGNOSIS — N400 Enlarged prostate without lower urinary tract symptoms: Secondary | ICD-10-CM | POA: Diagnosis not present

## 2017-03-29 DIAGNOSIS — K5900 Constipation, unspecified: Secondary | ICD-10-CM | POA: Diagnosis not present

## 2017-03-29 DIAGNOSIS — I10 Essential (primary) hypertension: Secondary | ICD-10-CM | POA: Diagnosis not present

## 2017-03-29 DIAGNOSIS — M62830 Muscle spasm of back: Secondary | ICD-10-CM | POA: Diagnosis not present

## 2017-03-29 DIAGNOSIS — G9009 Other idiopathic peripheral autonomic neuropathy: Secondary | ICD-10-CM | POA: Diagnosis not present

## 2017-03-29 DIAGNOSIS — E569 Vitamin deficiency, unspecified: Secondary | ICD-10-CM | POA: Diagnosis not present

## 2017-03-29 DIAGNOSIS — S065X0A Traumatic subdural hemorrhage without loss of consciousness, initial encounter: Secondary | ICD-10-CM | POA: Diagnosis not present

## 2017-03-29 DIAGNOSIS — M6281 Muscle weakness (generalized): Secondary | ICD-10-CM | POA: Diagnosis not present

## 2017-03-29 DIAGNOSIS — E785 Hyperlipidemia, unspecified: Secondary | ICD-10-CM | POA: Diagnosis not present

## 2017-03-29 DIAGNOSIS — E119 Type 2 diabetes mellitus without complications: Secondary | ICD-10-CM | POA: Diagnosis not present

## 2017-03-29 DIAGNOSIS — K219 Gastro-esophageal reflux disease without esophagitis: Secondary | ICD-10-CM | POA: Diagnosis not present

## 2017-04-02 DIAGNOSIS — E119 Type 2 diabetes mellitus without complications: Secondary | ICD-10-CM | POA: Diagnosis not present

## 2017-04-02 DIAGNOSIS — E785 Hyperlipidemia, unspecified: Secondary | ICD-10-CM | POA: Diagnosis not present

## 2017-04-02 DIAGNOSIS — K5900 Constipation, unspecified: Secondary | ICD-10-CM | POA: Diagnosis not present

## 2017-04-02 DIAGNOSIS — N400 Enlarged prostate without lower urinary tract symptoms: Secondary | ICD-10-CM | POA: Diagnosis not present

## 2017-04-02 DIAGNOSIS — M6281 Muscle weakness (generalized): Secondary | ICD-10-CM | POA: Diagnosis not present

## 2017-04-02 DIAGNOSIS — I10 Essential (primary) hypertension: Secondary | ICD-10-CM | POA: Diagnosis not present

## 2017-04-02 DIAGNOSIS — M62830 Muscle spasm of back: Secondary | ICD-10-CM | POA: Diagnosis not present

## 2017-04-02 DIAGNOSIS — K219 Gastro-esophageal reflux disease without esophagitis: Secondary | ICD-10-CM | POA: Diagnosis not present

## 2017-04-02 DIAGNOSIS — R52 Pain, unspecified: Secondary | ICD-10-CM | POA: Diagnosis not present

## 2017-04-02 DIAGNOSIS — G9009 Other idiopathic peripheral autonomic neuropathy: Secondary | ICD-10-CM | POA: Diagnosis not present

## 2017-04-02 DIAGNOSIS — S065X0A Traumatic subdural hemorrhage without loss of consciousness, initial encounter: Secondary | ICD-10-CM | POA: Diagnosis not present

## 2017-04-02 DIAGNOSIS — E569 Vitamin deficiency, unspecified: Secondary | ICD-10-CM | POA: Diagnosis not present

## 2017-04-04 ENCOUNTER — Other Ambulatory Visit: Payer: Self-pay | Admitting: Licensed Clinical Social Worker

## 2017-04-04 DIAGNOSIS — K219 Gastro-esophageal reflux disease without esophagitis: Secondary | ICD-10-CM | POA: Diagnosis not present

## 2017-04-04 DIAGNOSIS — S065X0A Traumatic subdural hemorrhage without loss of consciousness, initial encounter: Secondary | ICD-10-CM | POA: Diagnosis not present

## 2017-04-04 DIAGNOSIS — M6281 Muscle weakness (generalized): Secondary | ICD-10-CM | POA: Diagnosis not present

## 2017-04-04 DIAGNOSIS — K5900 Constipation, unspecified: Secondary | ICD-10-CM | POA: Diagnosis not present

## 2017-04-04 DIAGNOSIS — G9009 Other idiopathic peripheral autonomic neuropathy: Secondary | ICD-10-CM | POA: Diagnosis not present

## 2017-04-04 DIAGNOSIS — I10 Essential (primary) hypertension: Secondary | ICD-10-CM | POA: Diagnosis not present

## 2017-04-04 DIAGNOSIS — E785 Hyperlipidemia, unspecified: Secondary | ICD-10-CM | POA: Diagnosis not present

## 2017-04-04 DIAGNOSIS — R339 Retention of urine, unspecified: Secondary | ICD-10-CM | POA: Diagnosis not present

## 2017-04-04 DIAGNOSIS — E119 Type 2 diabetes mellitus without complications: Secondary | ICD-10-CM | POA: Diagnosis not present

## 2017-04-04 DIAGNOSIS — N400 Enlarged prostate without lower urinary tract symptoms: Secondary | ICD-10-CM | POA: Diagnosis not present

## 2017-04-04 DIAGNOSIS — M62830 Muscle spasm of back: Secondary | ICD-10-CM | POA: Diagnosis not present

## 2017-04-04 DIAGNOSIS — R52 Pain, unspecified: Secondary | ICD-10-CM | POA: Diagnosis not present

## 2017-04-04 NOTE — Patient Outreach (Addendum)
[facility]) Care Management  04/04/2017  Daniel [name] 09-03-1935 [id number]  Assessment- CSW received from patient's insurance for [facility] involvement while patient is at [facility]. CSW arrived at patient's room and introduced self, reason for visit and of [place]. Patient provided two HIPPA verifications successfully. Patient has a history of Alzheimer's disease. Patient hesitant about signing consent and agreeing to [facility] services at this time as his caregiver and main support (spouse) is currently hospitalized at [facility]. Patient reports that his spouse usually makes all of his decisions for him and he would feel more comfortable if [facility] CSW came back in two-three weeks to follow up, talk about program again to see if family is interested in [place] at that time. CSW respects patient's wishes and request. CSW provided [facility] Care Management Folder and left CSW's contact number.  Plan-CSW will complete SNF visit within two-three weeks to re-introduce patient to [place].   Eula Fried, BSW, MSW, [email or web address] Phone: [phone] Fax: [phone]

## 2017-04-05 NOTE — Progress Notes (Signed)
Physical Therapy Discharge Summary  Patient Details  Name: Daniel [name] MRN: [id number] Date of Birth: February 04, 1935   Late entry for d/c to SNF on 03/28/17.  Patient has met 3 of 7 long term goals due to improved activity tolerance, improved balance, increased range of motion, functional use of  right lower extremity and left lower extremity and improved awareness.  Patient to discharge at an ambulatory level Mod Assist to stand, min assist to ambulate.   Patient's care partner unavailable to provide the necessary physical assistance at discharge. Pt discharging to SNF.  Reasons goals not met: sciatic pain limited pt in mobility, in addition to low activity tolerance due to bil SDH  Recommendation:  Patient will benefit from ongoing skilled PT services in skilled nursing facility setting to continue to advance safe functional mobility, address ongoing impairments in pain, mobility, balance activity tolerance, locomotion, and minimize fall risk.  Equipment: No equipment provided  Reasons for discharge: treatment goals met and discharge from hospital  Patient/family agrees with progress made and goals achieved: Yes  PT Discharge Precautions/Restrictions Precautions Precautions: Fall(late entry for 3/13) Restrictions Weight Bearing Restrictions: No  Pain Pain Assessment Pain Score: (moderate, unrated; during sit> stand) Pain Type: Neuropathic pain(late entry) Pain Location: Buttocks Pain Orientation: Left Pain Descriptors / Indicators: Burning Vision/Perception- no deficits; wears glasses at all times     Cognition Overall Cognitive Status: History of cognitive impairments - at baseline(late entry for 3/13) Arousal/Alertness: Awake/alert Orientation Level: Oriented X4 Sustained Attention: Appears intact Selective Attention: Appears intact Alternating Attention: Impaired Memory: Impaired(baseline deficit, however pt demonstrated ability to recall strategies and familiar  therapists over time/with repetition) Awareness: Appears intact Problem Solving: Impaired Problem Solving Impairment: Functional complex;Verbal complex Safety/Judgment: Appears intact Comments: memory, semi-complex problem solving and higher level attention baseline deficits with Alzheimers Sensation- lt touch and proprioception appear intact   Motor  Motor Motor: Other (comment)(late entry for 3/13) Motor - Skilled Clinical Observations: mild deficits in motor planning  Mobility-  Late entry for 3/13 Bed Mobility Bed Mobility: Rolling Right;Rolling Left;Right Sidelying to Sit;Sit to Supine(late entry for 3/13) Rolling Right: 5: Supervision Rolling Left: 5: Supervision Right Sidelying to Sit: 5: Supervision Right Sidelying to Sit Details: Verbal cues for technique Supine to Sit: 5: Supervision Supine to Sit Details: Verbal cues for technique Sit to Supine: 5: Supervision Sit to Supine - Details: Verbal cues for technique Transfers Transfers: Yes Sit to Stand: 3: Mod assist(due to sciatica) Sit to Stand Details: Manual facilitation for weight shifting;Verbal cues for sequencing;Verbal cues for technique Sit to Stand Details (indicate cue type and reason): limited by pain Stand Pivot Transfers: 4: Min assist;With armrests Stand Pivot Transfer Details: Manual facilitation for weight shifting;Verbal cues for technique Locomotion  Ambulation Ambulation: Yes(late entry for 3/13) Ambulation/Gait Assistance: 4: Min assist Ambulation Distance (Feet): 50 Feet Assistive device: Rolling walker Ambulation/Gait Assistance Details: Verbal cues for safe use of DME/AE;Manual facilitation for weight shifting;Verbal cues for precautions/safety Gait Gait: Yes Gait Pattern: Impaired Gait Pattern: Trunk flexed;Narrow base of support;Step-through pattern;Antalgic Gait velocity: decreased Stairs / Additional Locomotion Stairs: Yes Stairs Assistance: 4: Min assist Stairs Assistance Details:  Manual facilitation for weight shifting;Verbal cues for technique Stair Management Technique: Two rails Number of Stairs: 12 Height of Stairs: 4(and 6") Ramp: Not tested (comment) Curb: Not tested (comment) Architect: Yes Wheelchair Assistance: 5: Investment banker, operational Details: Tactile cues for initiation Wheelchair Propulsion: Both lower extermities Distance: 50  Trunk/Postural Assessment  Cervical Assessment Cervical Assessment:  Within Functional Limits(late entry for 3/13; forward head, mild) Thoracic Assessment Thoracic Assessment: Within Functional Limits Lumbar Assessment Lumbar Assessment: Exceptions to WFL(posterior pelvic tilt preference) Postural Control Postural Control: Deficits on evaluation Postural Limitations: decreased limits of stability  Balance Balance Balance Assessed: Yes(late entry for 3/13) Static Sitting Balance Static Sitting - Balance Support: Feet supported Static Sitting - Level of Assistance: 5: Stand by assistance Dynamic Sitting Balance Dynamic Sitting - Balance Support: Feet supported;During functional activity Dynamic Sitting - Level of Assistance: 5: Stand by assistance Static Standing Balance Static Standing - Balance Support: Right upper extremity supported Static Standing - Level of Assistance: 5: Stand by assistance Dynamic Standing Balance Dynamic Standing - Balance Support: During functional activity;Right upper extremity supported Dynamic Standing - Level of Assistance: 5: Stand by assistance Extremity Assessment - late entry for 3/13     RLE Assessment RLE Assessment: Within Functional Limits(MMT not performed due to time constraints) LLE Assessment LLE Assessment: Within Functional Limits(MMT not performed due to time constraints)   See Function Navigator for Current Functional Status.  Daniel [name] 04/05/2017, 3:47 PM

## 2017-04-05 NOTE — Plan of Care (Signed)
Pt achieved stairs goal of min assist x 8 steps.

## 2017-04-06 DIAGNOSIS — E119 Type 2 diabetes mellitus without complications: Secondary | ICD-10-CM | POA: Diagnosis not present

## 2017-04-06 DIAGNOSIS — G9009 Other idiopathic peripheral autonomic neuropathy: Secondary | ICD-10-CM | POA: Diagnosis not present

## 2017-04-06 DIAGNOSIS — K5900 Constipation, unspecified: Secondary | ICD-10-CM | POA: Diagnosis not present

## 2017-04-06 DIAGNOSIS — R52 Pain, unspecified: Secondary | ICD-10-CM | POA: Diagnosis not present

## 2017-04-06 DIAGNOSIS — M6281 Muscle weakness (generalized): Secondary | ICD-10-CM | POA: Diagnosis not present

## 2017-04-06 DIAGNOSIS — I10 Essential (primary) hypertension: Secondary | ICD-10-CM | POA: Diagnosis not present

## 2017-04-06 DIAGNOSIS — K219 Gastro-esophageal reflux disease without esophagitis: Secondary | ICD-10-CM | POA: Diagnosis not present

## 2017-04-06 DIAGNOSIS — S065X0A Traumatic subdural hemorrhage without loss of consciousness, initial encounter: Secondary | ICD-10-CM | POA: Diagnosis not present

## 2017-04-06 DIAGNOSIS — N400 Enlarged prostate without lower urinary tract symptoms: Secondary | ICD-10-CM | POA: Diagnosis not present

## 2017-04-06 DIAGNOSIS — M62830 Muscle spasm of back: Secondary | ICD-10-CM | POA: Diagnosis not present

## 2017-04-06 DIAGNOSIS — E785 Hyperlipidemia, unspecified: Secondary | ICD-10-CM | POA: Diagnosis not present

## 2017-04-06 DIAGNOSIS — G936 Cerebral edema: Secondary | ICD-10-CM | POA: Diagnosis not present

## 2017-04-09 DIAGNOSIS — D649 Anemia, unspecified: Secondary | ICD-10-CM | POA: Diagnosis not present

## 2017-04-09 DIAGNOSIS — Z79899 Other long term (current) drug therapy: Secondary | ICD-10-CM | POA: Diagnosis not present

## 2017-04-09 DIAGNOSIS — I10 Essential (primary) hypertension: Secondary | ICD-10-CM | POA: Diagnosis not present

## 2017-04-12 DIAGNOSIS — E785 Hyperlipidemia, unspecified: Secondary | ICD-10-CM | POA: Diagnosis not present

## 2017-04-12 DIAGNOSIS — F419 Anxiety disorder, unspecified: Secondary | ICD-10-CM | POA: Diagnosis not present

## 2017-04-12 DIAGNOSIS — S065X0A Traumatic subdural hemorrhage without loss of consciousness, initial encounter: Secondary | ICD-10-CM | POA: Diagnosis not present

## 2017-04-12 DIAGNOSIS — I6200 Nontraumatic subdural hemorrhage, unspecified: Secondary | ICD-10-CM | POA: Diagnosis not present

## 2017-04-12 DIAGNOSIS — N400 Enlarged prostate without lower urinary tract symptoms: Secondary | ICD-10-CM | POA: Diagnosis not present

## 2017-04-12 DIAGNOSIS — R52 Pain, unspecified: Secondary | ICD-10-CM | POA: Diagnosis not present

## 2017-04-12 DIAGNOSIS — G936 Cerebral edema: Secondary | ICD-10-CM | POA: Diagnosis not present

## 2017-04-12 DIAGNOSIS — G9009 Other idiopathic peripheral autonomic neuropathy: Secondary | ICD-10-CM | POA: Diagnosis not present

## 2017-04-12 DIAGNOSIS — K219 Gastro-esophageal reflux disease without esophagitis: Secondary | ICD-10-CM | POA: Diagnosis not present

## 2017-04-12 DIAGNOSIS — E119 Type 2 diabetes mellitus without complications: Secondary | ICD-10-CM | POA: Diagnosis not present

## 2017-04-12 DIAGNOSIS — E569 Vitamin deficiency, unspecified: Secondary | ICD-10-CM | POA: Diagnosis not present

## 2017-04-12 DIAGNOSIS — R2681 Unsteadiness on feet: Secondary | ICD-10-CM | POA: Diagnosis not present

## 2017-04-12 DIAGNOSIS — M6281 Muscle weakness (generalized): Secondary | ICD-10-CM | POA: Diagnosis not present

## 2017-04-12 DIAGNOSIS — M62830 Muscle spasm of back: Secondary | ICD-10-CM | POA: Diagnosis not present

## 2017-04-12 DIAGNOSIS — I10 Essential (primary) hypertension: Secondary | ICD-10-CM | POA: Diagnosis not present

## 2017-04-16 DIAGNOSIS — R2681 Unsteadiness on feet: Secondary | ICD-10-CM | POA: Diagnosis not present

## 2017-04-16 DIAGNOSIS — M6281 Muscle weakness (generalized): Secondary | ICD-10-CM | POA: Diagnosis not present

## 2017-04-16 DIAGNOSIS — I6200 Nontraumatic subdural hemorrhage, unspecified: Secondary | ICD-10-CM | POA: Diagnosis not present

## 2017-04-18 DIAGNOSIS — J040 Acute laryngitis: Secondary | ICD-10-CM | POA: Diagnosis not present

## 2017-04-18 DIAGNOSIS — J029 Acute pharyngitis, unspecified: Secondary | ICD-10-CM | POA: Diagnosis not present

## 2017-04-18 DIAGNOSIS — R05 Cough: Secondary | ICD-10-CM | POA: Diagnosis not present

## 2017-04-19 ENCOUNTER — Other Ambulatory Visit: Payer: Self-pay | Admitting: Licensed Clinical Social Worker

## 2017-04-19 NOTE — Patient Outreach (Addendum)
[place] [facility]) Care Management  04/19/2017  RION CATALA June 22, 1935 [id number]  Assessment- CSW completed call to [facility] SNF and asked to speak with SNF social worker [place]. CSW was able to reach [facility] successfully and asked for any updates on patient. [facility] CSW was informed that patient is no longer working with therapy at facility and is paying out of pocket to stay there at this time under long term care. Patient is not eligible for [place] now that he has been placed under long term care.   Plan-CSW will close case at this time.   Eula Fried, BSW, MSW, [email or web address] Phone: [phone] Fax: [phone]

## 2017-04-26 DIAGNOSIS — R197 Diarrhea, unspecified: Secondary | ICD-10-CM | POA: Diagnosis not present

## 2017-04-27 DIAGNOSIS — A0472 Enterocolitis due to Clostridium difficile, not specified as recurrent: Secondary | ICD-10-CM | POA: Diagnosis not present

## 2017-04-27 DIAGNOSIS — R197 Diarrhea, unspecified: Secondary | ICD-10-CM | POA: Diagnosis not present

## 2017-05-01 ENCOUNTER — Other Ambulatory Visit: Payer: Self-pay | Admitting: Neurological Surgery

## 2017-05-01 DIAGNOSIS — G960 Cerebrospinal fluid leak: Principal | ICD-10-CM

## 2017-05-01 DIAGNOSIS — M5416 Radiculopathy, lumbar region: Secondary | ICD-10-CM

## 2017-05-01 DIAGNOSIS — G9608 Other cranial cerebrospinal fluid leak: Secondary | ICD-10-CM

## 2017-05-02 ENCOUNTER — Inpatient Hospital Stay: Payer: PPO | Admitting: Physical Medicine & Rehabilitation

## 2017-05-02 DIAGNOSIS — R197 Diarrhea, unspecified: Secondary | ICD-10-CM | POA: Diagnosis not present

## 2017-05-02 DIAGNOSIS — G936 Cerebral edema: Secondary | ICD-10-CM | POA: Diagnosis not present

## 2017-05-02 DIAGNOSIS — E119 Type 2 diabetes mellitus without complications: Secondary | ICD-10-CM | POA: Diagnosis not present

## 2017-05-02 DIAGNOSIS — H6122 Impacted cerumen, left ear: Secondary | ICD-10-CM | POA: Diagnosis not present

## 2017-05-02 DIAGNOSIS — S065X0A Traumatic subdural hemorrhage without loss of consciousness, initial encounter: Secondary | ICD-10-CM | POA: Diagnosis not present

## 2017-05-02 DIAGNOSIS — I10 Essential (primary) hypertension: Secondary | ICD-10-CM | POA: Diagnosis not present

## 2017-05-02 DIAGNOSIS — G9009 Other idiopathic peripheral autonomic neuropathy: Secondary | ICD-10-CM | POA: Diagnosis not present

## 2017-05-02 DIAGNOSIS — K5900 Constipation, unspecified: Secondary | ICD-10-CM | POA: Diagnosis not present

## 2017-05-02 DIAGNOSIS — N400 Enlarged prostate without lower urinary tract symptoms: Secondary | ICD-10-CM | POA: Diagnosis not present

## 2017-05-02 DIAGNOSIS — M6281 Muscle weakness (generalized): Secondary | ICD-10-CM | POA: Diagnosis not present

## 2017-05-02 DIAGNOSIS — R52 Pain, unspecified: Secondary | ICD-10-CM | POA: Diagnosis not present

## 2017-05-02 DIAGNOSIS — M62830 Muscle spasm of back: Secondary | ICD-10-CM | POA: Diagnosis not present

## 2017-05-03 DIAGNOSIS — F419 Anxiety disorder, unspecified: Secondary | ICD-10-CM | POA: Diagnosis not present

## 2017-05-08 DIAGNOSIS — H6122 Impacted cerumen, left ear: Secondary | ICD-10-CM | POA: Diagnosis not present

## 2017-05-08 DIAGNOSIS — N400 Enlarged prostate without lower urinary tract symptoms: Secondary | ICD-10-CM | POA: Diagnosis not present

## 2017-05-08 DIAGNOSIS — G936 Cerebral edema: Secondary | ICD-10-CM | POA: Diagnosis not present

## 2017-05-08 DIAGNOSIS — G9009 Other idiopathic peripheral autonomic neuropathy: Secondary | ICD-10-CM | POA: Diagnosis not present

## 2017-05-08 DIAGNOSIS — M6281 Muscle weakness (generalized): Secondary | ICD-10-CM | POA: Diagnosis not present

## 2017-05-08 DIAGNOSIS — R52 Pain, unspecified: Secondary | ICD-10-CM | POA: Diagnosis not present

## 2017-05-08 DIAGNOSIS — M62830 Muscle spasm of back: Secondary | ICD-10-CM | POA: Diagnosis not present

## 2017-05-08 DIAGNOSIS — S065X0A Traumatic subdural hemorrhage without loss of consciousness, initial encounter: Secondary | ICD-10-CM | POA: Diagnosis not present

## 2017-05-08 DIAGNOSIS — K5900 Constipation, unspecified: Secondary | ICD-10-CM | POA: Diagnosis not present

## 2017-05-08 DIAGNOSIS — E119 Type 2 diabetes mellitus without complications: Secondary | ICD-10-CM | POA: Diagnosis not present

## 2017-05-08 DIAGNOSIS — R197 Diarrhea, unspecified: Secondary | ICD-10-CM | POA: Diagnosis not present

## 2017-05-08 DIAGNOSIS — I10 Essential (primary) hypertension: Secondary | ICD-10-CM | POA: Diagnosis not present

## 2017-05-10 DIAGNOSIS — H6123 Impacted cerumen, bilateral: Secondary | ICD-10-CM | POA: Diagnosis not present

## 2017-05-10 DIAGNOSIS — F4323 Adjustment disorder with mixed anxiety and depressed mood: Secondary | ICD-10-CM | POA: Diagnosis not present

## 2017-05-10 DIAGNOSIS — F419 Anxiety disorder, unspecified: Secondary | ICD-10-CM | POA: Diagnosis not present

## 2017-05-10 DIAGNOSIS — F0280 Dementia in other diseases classified elsewhere without behavioral disturbance: Secondary | ICD-10-CM | POA: Diagnosis not present

## 2017-05-10 DIAGNOSIS — G301 Alzheimer's disease with late onset: Secondary | ICD-10-CM | POA: Diagnosis not present

## 2017-05-14 ENCOUNTER — Ambulatory Visit
Admission: RE | Admit: 2017-05-14 | Discharge: 2017-05-14 | Disposition: A | Discharge status: Home / Self Care | Payer: PPO | Source: Ambulatory Visit | Attending: Neurological Surgery | Admitting: Neurological Surgery

## 2017-05-14 DIAGNOSIS — M5116 Intervertebral disc disorders with radiculopathy, lumbar region: Secondary | ICD-10-CM | POA: Diagnosis not present

## 2017-05-14 DIAGNOSIS — G9608 Other cranial cerebrospinal fluid leak: Secondary | ICD-10-CM

## 2017-05-14 DIAGNOSIS — G960 Cerebrospinal fluid leak: Principal | ICD-10-CM

## 2017-05-14 DIAGNOSIS — I6200 Nontraumatic subdural hemorrhage, unspecified: Secondary | ICD-10-CM | POA: Diagnosis not present

## 2017-05-14 DIAGNOSIS — M48061 Spinal stenosis, lumbar region without neurogenic claudication: Secondary | ICD-10-CM | POA: Diagnosis not present

## 2017-05-14 DIAGNOSIS — M5416 Radiculopathy, lumbar region: Secondary | ICD-10-CM

## 2017-05-16 DIAGNOSIS — M5416 Radiculopathy, lumbar region: Secondary | ICD-10-CM | POA: Diagnosis not present

## 2017-05-17 DIAGNOSIS — H6123 Impacted cerumen, bilateral: Secondary | ICD-10-CM | POA: Diagnosis not present

## 2017-05-18 DIAGNOSIS — M6281 Muscle weakness (generalized): Secondary | ICD-10-CM | POA: Diagnosis not present

## 2017-05-18 DIAGNOSIS — R2681 Unsteadiness on feet: Secondary | ICD-10-CM | POA: Diagnosis not present

## 2017-05-18 DIAGNOSIS — I6200 Nontraumatic subdural hemorrhage, unspecified: Secondary | ICD-10-CM | POA: Diagnosis not present

## 2017-05-23 DIAGNOSIS — R52 Pain, unspecified: Secondary | ICD-10-CM | POA: Diagnosis not present

## 2017-05-23 DIAGNOSIS — S065X0A Traumatic subdural hemorrhage without loss of consciousness, initial encounter: Secondary | ICD-10-CM | POA: Diagnosis not present

## 2017-05-23 DIAGNOSIS — M6281 Muscle weakness (generalized): Secondary | ICD-10-CM | POA: Diagnosis not present

## 2017-05-31 DIAGNOSIS — F419 Anxiety disorder, unspecified: Secondary | ICD-10-CM | POA: Diagnosis not present

## 2017-07-03 ENCOUNTER — Other Ambulatory Visit: Payer: Self-pay | Admitting: Neurological Surgery

## 2017-07-03 DIAGNOSIS — G9608 Other cranial cerebrospinal fluid leak: Secondary | ICD-10-CM

## 2017-07-03 DIAGNOSIS — G960 Cerebrospinal fluid leak: Principal | ICD-10-CM

## 2017-07-24 ENCOUNTER — Ambulatory Visit
Admission: RE | Admit: 2017-07-24 | Discharge: 2017-07-24 | Disposition: A | Discharge status: Home / Self Care | Payer: PPO | Source: Ambulatory Visit | Attending: Neurological Surgery | Admitting: Neurological Surgery

## 2017-07-24 DIAGNOSIS — G960 Cerebrospinal fluid leak: Principal | ICD-10-CM

## 2017-07-24 DIAGNOSIS — G9608 Other cranial cerebrospinal fluid leak: Secondary | ICD-10-CM

## 2017-07-24 DIAGNOSIS — I6200 Nontraumatic subdural hemorrhage, unspecified: Secondary | ICD-10-CM | POA: Diagnosis not present

## 2017-07-25 DIAGNOSIS — G960 Cerebrospinal fluid leak: Secondary | ICD-10-CM | POA: Diagnosis not present

## 2017-07-31 DIAGNOSIS — H524 Presbyopia: Secondary | ICD-10-CM | POA: Diagnosis not present

## 2017-07-31 DIAGNOSIS — Z961 Presence of intraocular lens: Secondary | ICD-10-CM | POA: Diagnosis not present

## 2017-07-31 DIAGNOSIS — E119 Type 2 diabetes mellitus without complications: Secondary | ICD-10-CM | POA: Diagnosis not present

## 2017-07-31 DIAGNOSIS — H52203 Unspecified astigmatism, bilateral: Secondary | ICD-10-CM | POA: Diagnosis not present

## 2017-08-01 ENCOUNTER — Telehealth: Payer: Self-pay | Admitting: *Deleted

## 2017-08-01 NOTE — Telephone Encounter (Signed)
Pt. presented to the office with a handwritten med list and asked that our med list in Epic be updated, which I have done/fim

## 2017-08-20 DIAGNOSIS — K219 Gastro-esophageal reflux disease without esophagitis: Secondary | ICD-10-CM | POA: Diagnosis not present

## 2017-08-20 DIAGNOSIS — E119 Type 2 diabetes mellitus without complications: Secondary | ICD-10-CM | POA: Diagnosis not present

## 2017-08-20 DIAGNOSIS — E785 Hyperlipidemia, unspecified: Secondary | ICD-10-CM | POA: Diagnosis not present

## 2017-08-20 DIAGNOSIS — Z7982 Long term (current) use of aspirin: Secondary | ICD-10-CM | POA: Diagnosis not present

## 2017-08-20 DIAGNOSIS — Z125 Encounter for screening for malignant neoplasm of prostate: Secondary | ICD-10-CM | POA: Diagnosis not present

## 2017-08-20 DIAGNOSIS — I10 Essential (primary) hypertension: Secondary | ICD-10-CM | POA: Diagnosis not present

## 2017-08-23 DIAGNOSIS — Z0000 Encounter for general adult medical examination without abnormal findings: Secondary | ICD-10-CM | POA: Diagnosis not present

## 2017-08-23 DIAGNOSIS — I10 Essential (primary) hypertension: Secondary | ICD-10-CM | POA: Diagnosis not present

## 2017-08-23 DIAGNOSIS — K589 Irritable bowel syndrome without diarrhea: Secondary | ICD-10-CM | POA: Diagnosis not present

## 2017-08-23 DIAGNOSIS — K5730 Diverticulosis of large intestine without perforation or abscess without bleeding: Secondary | ICD-10-CM | POA: Diagnosis not present

## 2017-08-23 DIAGNOSIS — M549 Dorsalgia, unspecified: Secondary | ICD-10-CM | POA: Diagnosis not present

## 2017-08-23 DIAGNOSIS — L219 Seborrheic dermatitis, unspecified: Secondary | ICD-10-CM | POA: Diagnosis not present

## 2017-08-23 DIAGNOSIS — E1149 Type 2 diabetes mellitus with other diabetic neurological complication: Secondary | ICD-10-CM | POA: Diagnosis not present

## 2017-08-23 DIAGNOSIS — M159 Polyosteoarthritis, unspecified: Secondary | ICD-10-CM | POA: Diagnosis not present

## 2017-08-23 DIAGNOSIS — K219 Gastro-esophageal reflux disease without esophagitis: Secondary | ICD-10-CM | POA: Diagnosis not present

## 2017-08-23 DIAGNOSIS — G309 Alzheimer's disease, unspecified: Secondary | ICD-10-CM | POA: Diagnosis not present

## 2017-08-23 DIAGNOSIS — E785 Hyperlipidemia, unspecified: Secondary | ICD-10-CM | POA: Diagnosis not present

## 2017-08-23 DIAGNOSIS — E041 Nontoxic single thyroid nodule: Secondary | ICD-10-CM | POA: Diagnosis not present

## 2017-09-03 DIAGNOSIS — E785 Hyperlipidemia, unspecified: Secondary | ICD-10-CM | POA: Diagnosis not present

## 2017-09-03 DIAGNOSIS — M159 Polyosteoarthritis, unspecified: Secondary | ICD-10-CM | POA: Diagnosis not present

## 2017-09-03 DIAGNOSIS — I10 Essential (primary) hypertension: Secondary | ICD-10-CM | POA: Diagnosis not present

## 2017-09-03 DIAGNOSIS — E1149 Type 2 diabetes mellitus with other diabetic neurological complication: Secondary | ICD-10-CM | POA: Diagnosis not present

## 2017-10-12 ENCOUNTER — Other Ambulatory Visit: Payer: Self-pay | Admitting: Neurological Surgery

## 2017-10-12 DIAGNOSIS — G960 Cerebrospinal fluid leak: Principal | ICD-10-CM

## 2017-10-12 DIAGNOSIS — G9608 Other cranial cerebrospinal fluid leak: Secondary | ICD-10-CM

## 2017-10-18 DIAGNOSIS — L570 Actinic keratosis: Secondary | ICD-10-CM | POA: Diagnosis not present

## 2017-10-18 DIAGNOSIS — D0439 Carcinoma in situ of skin of other parts of face: Secondary | ICD-10-CM | POA: Diagnosis not present

## 2017-10-18 DIAGNOSIS — L821 Other seborrheic keratosis: Secondary | ICD-10-CM | POA: Diagnosis not present

## 2017-10-18 DIAGNOSIS — D485 Neoplasm of uncertain behavior of skin: Secondary | ICD-10-CM | POA: Diagnosis not present

## 2017-10-24 ENCOUNTER — Ambulatory Visit
Admission: RE | Admit: 2017-10-24 | Discharge: 2017-10-24 | Disposition: A | Discharge status: Home / Self Care | Payer: PPO | Source: Ambulatory Visit | Attending: Neurological Surgery | Admitting: Neurological Surgery

## 2017-10-24 DIAGNOSIS — G9608 Other cranial cerebrospinal fluid leak: Secondary | ICD-10-CM

## 2017-10-24 DIAGNOSIS — G960 Cerebrospinal fluid leak: Principal | ICD-10-CM

## 2017-10-24 DIAGNOSIS — I6200 Nontraumatic subdural hemorrhage, unspecified: Secondary | ICD-10-CM | POA: Diagnosis not present

## 2017-10-25 DIAGNOSIS — R030 Elevated blood-pressure reading, without diagnosis of hypertension: Secondary | ICD-10-CM | POA: Diagnosis not present

## 2017-10-25 DIAGNOSIS — G960 Cerebrospinal fluid leak: Secondary | ICD-10-CM | POA: Diagnosis not present

## 2017-10-25 DIAGNOSIS — M5416 Radiculopathy, lumbar region: Secondary | ICD-10-CM | POA: Diagnosis not present

## 2017-11-06 DIAGNOSIS — Z8679 Personal history of other diseases of the circulatory system: Secondary | ICD-10-CM | POA: Diagnosis not present

## 2017-11-06 DIAGNOSIS — K589 Irritable bowel syndrome without diarrhea: Secondary | ICD-10-CM | POA: Diagnosis not present

## 2017-11-06 DIAGNOSIS — R197 Diarrhea, unspecified: Secondary | ICD-10-CM | POA: Diagnosis not present

## 2017-11-06 DIAGNOSIS — M545 Low back pain: Secondary | ICD-10-CM | POA: Diagnosis not present

## 2017-11-06 DIAGNOSIS — K219 Gastro-esophageal reflux disease without esophagitis: Secondary | ICD-10-CM | POA: Diagnosis not present

## 2017-11-16 DIAGNOSIS — M5416 Radiculopathy, lumbar region: Secondary | ICD-10-CM | POA: Diagnosis not present

## 2017-11-16 DIAGNOSIS — Z981 Arthrodesis status: Secondary | ICD-10-CM | POA: Diagnosis not present

## 2017-11-21 ENCOUNTER — Encounter: Payer: Self-pay | Admitting: Neurology

## 2017-11-21 ENCOUNTER — Ambulatory Visit (INDEPENDENT_AMBULATORY_CARE_PROVIDER_SITE_OTHER): Payer: PPO | Admitting: Neurology

## 2017-11-21 VITALS — BP 132/71 | HR 57 | Ht 73.0 in | Wt 173.0 lb

## 2017-11-21 DIAGNOSIS — F0280 Dementia in other diseases classified elsewhere without behavioral disturbance: Secondary | ICD-10-CM

## 2017-11-21 DIAGNOSIS — G960 Cerebrospinal fluid leak: Secondary | ICD-10-CM

## 2017-11-21 DIAGNOSIS — G301 Alzheimer's disease with late onset: Secondary | ICD-10-CM

## 2017-11-21 DIAGNOSIS — G8929 Other chronic pain: Secondary | ICD-10-CM | POA: Diagnosis not present

## 2017-11-21 DIAGNOSIS — R350 Frequency of micturition: Secondary | ICD-10-CM | POA: Diagnosis not present

## 2017-11-21 DIAGNOSIS — M549 Dorsalgia, unspecified: Secondary | ICD-10-CM

## 2017-11-21 DIAGNOSIS — R413 Other amnesia: Secondary | ICD-10-CM

## 2017-11-21 DIAGNOSIS — G9608 Other cranial cerebrospinal fluid leak: Secondary | ICD-10-CM

## 2017-11-21 MED ORDER — RIVASTIGMINE 9.5 MG/24HR TD PT24: 9.5000 mg | MEDICATED_PATCH | Freq: Every day | TRANSDERMAL | 3 refills | Status: DC

## 2017-11-21 NOTE — Progress Notes (Signed)
[facility]  PATIENT: Daniel [name] DOB: [birth year]  REFERRING DOCTOR OR PCP:  Deland Pretty SOURCE: patient, notes from Dr. Shelia Media, labs, imaging reports, CT image  _________________________________   HISTORICAL  CHIEF COMPLAINT:  Chief Complaint  Patient presents with  . Follow-up    RM 12 with alone. Last seen 11/16/16.   . Memory Loss    Today's Green 23/30.     HISTORY OF PRESENT ILLNESS:  Daniel [name] is a 82 y.o. man with memory loss.  Update 11/21/2017: He feels his memory is a little worse than last year.  He has more trouble with names and dates.   He reads and sometimes gets online.    He feels his memory comprehension.    He has Exelon patches and tolerates them ok except mild diarrhea.     He is walking with his cane but has no recent falls.    He can walk a half mile with his cane.   No visual hallucinations.    He is sleeping well, with omset of only a minute or two.   He has urinary frequency and urgency so gets up a few times a night.   He falls right back asleep.      He does not talk in his sleep and no active dreams.        Montreal Cognitive Assessment  11/21/2017 06/08/2016 03/09/2016  Visuospatial/ Executive (0/5) 5 5 5   Naming (0/3) 3 3 3   Attention: Read list of digits (0/2) 2 2 2   Attention: Read list of letters (0/1) 1 1 1   Attention: Serial 7 subtraction starting at 100 (0/3) 1 3 3   Language: Repeat phrase (0/2) 2 2 2   Language : Fluency (0/1) 1 1 1   Abstraction (0/2) 2 2 2   Delayed Recall (0/5) 0 3 2  Orientation (0/6) 6 6 5   Total 23 28 26   Adjusted Score (based on education) 23 28 26      Update 11/16/2016:   Memory worsened after his spinal surgery (was under x 6 hours, reportedly) but has slowly returned practically back to the baseline before surgery.Marland Kitchen   He tolerated Exelon patches better than the Aricept.    He has not driven since the surgery.     He has trouble with recalling names and has decreased focus and  attention.  He had fusion L3-L5 and he needed dural repair.   He reports a headache, especially in the right neck.     The headache is most notable when he is sitting up a long time.       He falls asleep easily but wakes up a few times to urinate every night.    He does not have loud snoring    There are no REM behavior disorder symptoms.    From 06/08/2016:  At the initial visit, donepezil was started and he is doing better going from a MoCA score of 26 to 28.    He has had a lot of diarrhea.  This is inconsistent but occurs at least a few times a week and he has had fecal incontinence.        He often has feelings of Deja vu when they are out of the house or when watching TV.     As an example, they were looking at a map of [place] and he swore he had been there even though he had not.    He will be having lumbar surgery at L3-L5  soon by Dr. Ellene Route.   He sleeps well most nights.   He snores There are no witnessed gasps/snorts or pauses.      He does not have REM behavior disorder symptoms.  He drives and sometimes misses turns but has never gotten lost.  He denies depression but is sometimes irritable.   He is diabetic and has mild diabetic polyneuropathy, helped by Lyrica.    He used to be very active and did a lot of dancing and ran 10K.        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   He saw Dr. Jannifer Franklin and had an evaluation with MMSE = 29.   The MRI brain was normal.   He did not worsen but still noted some difficulty at times.Marland Kitchen  He continued to work as an Optometrist.  A few years later he started to notice that memory was becoming more of a problem again.  He sold hs practice in 2014 because he felt there was too high of a chance that he would make errors.Marland Kitchen   He feels memory issues mildly worsened slowly over time since then..  I have reviewed the CT scan from 06/27/2015 and compared it with the MRI from 09/05/2004. The CT scan shows moderate cortical atrophy, probably more pronounced in the mesial temporal lobes. There also appears to be some chronic microvascular ischemic change. The atrophy has progressed since the MRI from 2006.   At his initial visit in early 2018, the MoCA score was 26/30.    Follow-up 06/04/2016 showed improved scores of 28/30. However, he was experiencing a lot of diarrhea. Donepezil was switched to Exelon.   His great grandmother had dementia in her late 56's.  His mother had dementia that started in her 51's and died of a stroke, 2 aunts had severe dementia.   An uncle also had dementia (mild).  A second uncle had very mild dementia (late 49's) but died of unrelatred issues a year later.       REVIEW OF SYSTEMS: Constitutional: No fevers, chills, sweats, or change in appetite Eyes: No visual changes, double vision, eye pain Ear, nose and throat: No hearing loss, ear pain, nasal congestion, sore throat Cardiovascular: No chest pain, palpitations Respiratory: No shortness of breath at rest or with exertion.   No wheezes GastrointestinaI: No nausea, vomiting, diarrhea, abdominal pain, fecal incontinence Genitourinary: No dysuria, urinary retention or frequency.  No nocturia. Musculoskeletal: LBP, recent PLIF L3-L5 Integumentary: No rash, pruritus, skin lesions Neurological: as above Psychiatric: No depression at this tim\\\\\\\\\\\\\\\\\\\\\\\\\\\\\\\\\\\\\\\\\\\\\\\\\\\\e.  No anxiety Endocrine: He has DM  Type 2.  No palpitations, diaphoresis, change in appetite, change in weigh or increased thirst Hematologic/Lymphatic: No anemia, purpura, petechiae. Allergic/Immunologic: No itchy/runny eyes, nasal congestion, recent allergic reactions, rashes  ALLERGIES: Allergies  Allergen Reactions  .  Donepezil Diarrhea  . Tape Rash    PAPER TAPE IS PREFERRED, PLEASE    HOME MEDICATIONS:  Current Outpatient Medications:  .  atorvastatin (LIPITOR) 40 MG tablet, Take 40 mg by mouth daily at 6 (six) AM. , Disp: , Rfl:  .  calcium-vitamin D (OSCAL WITH D) 500-200 MG-UNIT tablet, Take 1 tablet by mouth daily with breakfast., Disp: , Rfl:  .  celecoxib (CELEBREX) 200 MG capsule, Take 200 mg by mouth daily., Disp: , Rfl: 2 .  Cholecalciferol ([employer] D3) 2000 units CAPS, Take 2,000 Units by mouth daily. , Disp: , Rfl:  .  dicyclomine (BENTYL) 20 MG tablet, TK 1 T PO TID PRN, Disp: , Rfl: 6 .  ezetimibe (ZETIA) 10 MG tablet, Take 1 tablet (10 mg total) by mouth every evening., Disp: , Rfl: 3 .  LYRICA 50 MG capsule, Take 1 capsule (50 mg total) by mouth 2 (two) times daily., Disp: 6 capsule, Rfl: 0 .  Multiple Minerals-Vitamins (CALCIUM-MAGNESIUM-ZINC-D3) TABS, Take 1 tablet by mouth daily. , Disp: , Rfl:  .  naproxen sodium (ALEVE) 220 MG tablet, Take 220 mg by mouth., Disp: , Rfl:  .  pantoprazole (PROTONIX) 40 MG tablet, Take 40 mg by mouth daily before breakfast. , Disp: , Rfl:  .  ramipril (ALTACE) 2.5 MG capsule, Take 2.5 mg by mouth daily. , Disp: , Rfl:  .  rivastigmine (EXELON) 9.5 mg/24hr, Place 1 patch (9.5 mg total) onto the skin daily., Disp: 3 patch, Rfl: 0 .  sitaGLIPtin (JANUVIA) 50 MG tablet, Take 50 mg by mouth daily., Disp: , Rfl:   PAST MEDICAL HISTORY: Past Medical History:  Diagnosis Date  . Alzheimer disease ([place])   . Arthritis   . Cancer (HCC)    Skin  . Diabetes mellitus without complication ([place])   . GERD (gastroesophageal reflux disease)   . Hyperlipidemia   . Hypertension       PAST SURGICAL HISTORY: Past Surgical History:  Procedure Laterality Date  . BACK SURGERY    . BURR HOLE Bilateral 03/09/2017   Procedure: BURR HOLES FOR SUBDURAL HEMATOMA BILATERAL;  Surgeon: Consuella Lose, MD;  Location: [facility];  Service: Neurosurgery;  Laterality: Bilateral;  . CATARACT EXTRACTION Bilateral 1987 89  . ROTATOR CUFF REPAIR Left 2003    FAMILY HISTORY: Family History  Problem Relation Age of Onset  . Alzheimer's disease Mother   . Alzheimer's disease Maternal Grandfather     SOCIAL HISTORY:  Social History   Socioeconomic History  . Marital status: Married    Spouse name: Not on file  . Number of children: Not on file  . Years of education: Not on file  . Highest education level: Not on file  Occupational History  . Not on file  Social Needs  . Financial resource strain: Not on file  . Food insecurity:    Worry: Not on file    Inability: Not on file  . Transportation needs:    Medical: Not on file    Non-medical: Not on file  Tobacco Use  . Smoking status: Former Smoker    Last attempt to quit: 01/16/1978    Years since quitting: 39.8  . Smokeless tobacco: Never Used  Substance and Sexual Activity  . Alcohol use: Yes    Comment: occasionally  . Drug use: No  . Sexual activity: Not on file  Lifestyle  . Physical activity:    Days per week: Not on file    Minutes per session: Not  on file  . Stress: Not on file  Relationships  . Social connections:    Talks on phone: Not on file    Gets together: Not on file    Attends religious service: Not on file    Active member of club or organization: Not on file    Attends meetings of clubs or organizations: Not on file    Relationship status: Not on file  . Intimate partner violence:    Fear of current or ex partner: Not on file    Emotionally abused: Not on file    Physically abused: Not on file    Forced sexual activity: Not on file  Other Topics Concern  . Not on file  Social History  Narrative  . Not on file     PHYSICAL EXAM  Vitals:   11/21/17 1426  BP: 132/71  Pulse: (!) 57  Weight: 173 lb (78.5 kg)  Height: 6\' 1"  (1.854 m)    Body mass index is 22.82 kg/m.   General: The patient is well-developed and well-nourished and in no acute distress   Neurologic Exam  Mental status: The patient is alert and oriented x 2 1/2 at the time of the examination (wrong date). The patient has apparent normal remote memory but reduced short-term memory requiring prompts (1/3 without and 3/3 with category prompt0.   He has reduced attention span and concentration ability (DLROW; [id number]) However, he made more mistakes when tested during the MoCa administered by the nurse.   Speech is normal.  Cranial nerves: Extraocular movements are full. Facial strength and sensation is normal. Trapezius and sternocleidomastoid strength is normal. No dysarthria is noted.  The tongue is midline, and the patient has symmetric elevation of the soft palate. No obvious hearing deficits are noted.  Motor:  He has a very mild 6-7 hz tremor in his right hand only.   Muscle bulk is normal.   Tone is normal. Strength is  5 / 5 in all 4 extremities.   Sensory: He had intact sensation to touch temperature and vibration in the hands and arms and proximal legs. He has reduced vibration sensation at the ankles.  Coordination: Cerebellar testing reveals good finger-nose-finger and heel-to-shin bilaterally.  Gait and station: Station is normal.  Gait with good stride, reduced arm swing.  The tandem gait is wide.    Romberg is negative.   Reflexes: Deep tendon reflexes are symmetric and normal bilaterally.       DIAGNOSTIC DATA (LABS, IMAGING, TESTING) - I reviewed patient records, labs, notes, testing and imaging myself where available.  Lab Results  Component Value Date   WBC 11.2 (H) 03/26/2017   HGB 13.6 03/26/2017   HCT 42.2 03/26/2017   MCV 90.4 03/26/2017   PLT 334 03/26/2017       Component Value Date/Time   NA 136 03/26/2017 1204   K 4.1 03/26/2017 1204   CL 99 (L) 03/26/2017 1204   CO2 24 03/26/2017 1204   GLUCOSE 129 (H) 03/26/2017 1204   BUN 20 03/26/2017 1204   CREATININE 0.86 03/26/2017 1204   CALCIUM 10.2 03/26/2017 1204   PROT 6.3 (L) 03/14/2017 0621   ALBUMIN 3.3 (L) 03/14/2017 0621   AST 22 03/14/2017 0621   ALT 15 (L) 03/14/2017 0621   ALKPHOS 72 03/14/2017 0621   BILITOT 0.9 03/14/2017 0621   GFRNONAA >60 03/26/2017 1204   GFRAA >60 03/26/2017 1204       ASSESSMENT AND PLAN  Late onset Alzheimer's disease  without behavioral disturbance (HCC)  Subdural hygroma  Memory loss  Urinary frequency  Chronic back pain, unspecified back location, unspecified back pain laterality   1.   He did worse on the MoCA (23 vs. 28 but did better with serial 7's when I tested him --- would have been 25). Continue Exelon patches. 2.    Continue to stay active and exercises tolerated. 3.    Return in 12 months or sooner if there are new or worsening neurologic symptoms.  Richard A. Felecia Shelling, MD, PhD 13/0/8657, 8:46 PM Certified in Neurology, Clinical Neurophysiology, Sleep Medicine, Pain Medicine and Neuroimaging  [facility] [address], [place], [place] [address] [phone]

## 2017-12-06 DIAGNOSIS — K219 Gastro-esophageal reflux disease without esophagitis: Secondary | ICD-10-CM | POA: Diagnosis not present

## 2017-12-06 DIAGNOSIS — K589 Irritable bowel syndrome without diarrhea: Secondary | ICD-10-CM | POA: Diagnosis not present

## 2017-12-06 DIAGNOSIS — K591 Functional diarrhea: Secondary | ICD-10-CM | POA: Diagnosis not present

## 2018-02-18 ENCOUNTER — Other Ambulatory Visit: Payer: Self-pay | Admitting: Neurological Surgery

## 2018-02-18 DIAGNOSIS — G960 Cerebrospinal fluid leak: Principal | ICD-10-CM

## 2018-02-18 DIAGNOSIS — G9608 Other cranial cerebrospinal fluid leak: Secondary | ICD-10-CM

## 2018-02-20 ENCOUNTER — Ambulatory Visit
Admission: RE | Admit: 2018-02-20 | Discharge: 2018-02-20 | Disposition: A | Discharge status: Home / Self Care | Payer: PPO | Source: Ambulatory Visit | Attending: Neurological Surgery | Admitting: Neurological Surgery

## 2018-02-20 DIAGNOSIS — G960 Cerebrospinal fluid leak: Principal | ICD-10-CM

## 2018-02-20 DIAGNOSIS — G9608 Other cranial cerebrospinal fluid leak: Secondary | ICD-10-CM

## 2018-02-20 DIAGNOSIS — I6200 Nontraumatic subdural hemorrhage, unspecified: Secondary | ICD-10-CM | POA: Diagnosis not present

## 2018-02-21 DIAGNOSIS — S32009K Unspecified fracture of unspecified lumbar vertebra, subsequent encounter for fracture with nonunion: Secondary | ICD-10-CM | POA: Diagnosis not present

## 2018-02-21 DIAGNOSIS — R030 Elevated blood-pressure reading, without diagnosis of hypertension: Secondary | ICD-10-CM | POA: Diagnosis not present

## 2018-02-21 DIAGNOSIS — M4316 Spondylolisthesis, lumbar region: Secondary | ICD-10-CM | POA: Diagnosis not present

## 2018-02-21 DIAGNOSIS — M5416 Radiculopathy, lumbar region: Secondary | ICD-10-CM | POA: Diagnosis not present

## 2018-02-27 DIAGNOSIS — Z85828 Personal history of other malignant neoplasm of skin: Secondary | ICD-10-CM | POA: Diagnosis not present

## 2018-02-27 DIAGNOSIS — D485 Neoplasm of uncertain behavior of skin: Secondary | ICD-10-CM | POA: Diagnosis not present

## 2018-02-27 DIAGNOSIS — I788 Other diseases of capillaries: Secondary | ICD-10-CM | POA: Diagnosis not present

## 2018-02-27 DIAGNOSIS — L570 Actinic keratosis: Secondary | ICD-10-CM | POA: Diagnosis not present

## 2018-03-08 DIAGNOSIS — M960 Pseudarthrosis after fusion or arthrodesis: Secondary | ICD-10-CM | POA: Diagnosis not present

## 2018-03-08 DIAGNOSIS — Z981 Arthrodesis status: Secondary | ICD-10-CM | POA: Diagnosis not present

## 2018-05-01 ENCOUNTER — Other Ambulatory Visit: Payer: Self-pay | Admitting: Neurological Surgery

## 2018-05-01 DIAGNOSIS — G9608 Other cranial cerebrospinal fluid leak: Secondary | ICD-10-CM

## 2018-05-01 DIAGNOSIS — G960 Cerebrospinal fluid leak: Principal | ICD-10-CM

## 2018-05-28 ENCOUNTER — Ambulatory Visit
Admission: RE | Admit: 2018-05-28 | Discharge: 2018-05-28 | Disposition: A | Discharge status: Home / Self Care | Payer: PPO | Source: Ambulatory Visit | Attending: Neurological Surgery | Admitting: Neurological Surgery

## 2018-05-28 DIAGNOSIS — G960 Cerebrospinal fluid leak: Secondary | ICD-10-CM | POA: Diagnosis not present

## 2018-05-28 DIAGNOSIS — G9608 Other cranial cerebrospinal fluid leak: Secondary | ICD-10-CM

## 2018-05-29 DIAGNOSIS — S32009K Unspecified fracture of unspecified lumbar vertebra, subsequent encounter for fracture with nonunion: Secondary | ICD-10-CM | POA: Diagnosis not present

## 2018-05-29 DIAGNOSIS — G960 Cerebrospinal fluid leak: Secondary | ICD-10-CM | POA: Diagnosis not present

## 2018-07-16 DIAGNOSIS — K589 Irritable bowel syndrome without diarrhea: Secondary | ICD-10-CM | POA: Diagnosis not present

## 2018-07-16 DIAGNOSIS — K219 Gastro-esophageal reflux disease without esophagitis: Secondary | ICD-10-CM | POA: Diagnosis not present

## 2018-07-16 DIAGNOSIS — M545 Low back pain: Secondary | ICD-10-CM | POA: Diagnosis not present

## 2018-07-24 ENCOUNTER — Other Ambulatory Visit: Payer: Self-pay | Admitting: Neurological Surgery

## 2018-07-24 DIAGNOSIS — S065X9A Traumatic subdural hemorrhage with loss of consciousness of unspecified duration, initial encounter: Secondary | ICD-10-CM

## 2018-07-24 DIAGNOSIS — S065XAA Traumatic subdural hemorrhage with loss of consciousness status unknown, initial encounter: Secondary | ICD-10-CM

## 2018-08-01 DIAGNOSIS — E119 Type 2 diabetes mellitus without complications: Secondary | ICD-10-CM | POA: Diagnosis not present

## 2018-08-01 DIAGNOSIS — Z961 Presence of intraocular lens: Secondary | ICD-10-CM | POA: Diagnosis not present

## 2018-08-01 DIAGNOSIS — H524 Presbyopia: Secondary | ICD-10-CM | POA: Diagnosis not present

## 2018-08-01 DIAGNOSIS — Z7984 Long term (current) use of oral hypoglycemic drugs: Secondary | ICD-10-CM | POA: Diagnosis not present

## 2018-08-01 DIAGNOSIS — H52203 Unspecified astigmatism, bilateral: Secondary | ICD-10-CM | POA: Diagnosis not present

## 2018-08-15 DIAGNOSIS — I10 Essential (primary) hypertension: Secondary | ICD-10-CM | POA: Diagnosis not present

## 2018-08-15 DIAGNOSIS — E1149 Type 2 diabetes mellitus with other diabetic neurological complication: Secondary | ICD-10-CM | POA: Diagnosis not present

## 2018-08-15 DIAGNOSIS — E785 Hyperlipidemia, unspecified: Secondary | ICD-10-CM | POA: Diagnosis not present

## 2018-08-15 DIAGNOSIS — G629 Polyneuropathy, unspecified: Secondary | ICD-10-CM | POA: Diagnosis not present

## 2018-08-19 ENCOUNTER — Ambulatory Visit
Admission: RE | Admit: 2018-08-19 | Discharge: 2018-08-19 | Disposition: A | Discharge status: Home / Self Care | Payer: PPO | Source: Ambulatory Visit | Attending: Neurological Surgery | Admitting: Neurological Surgery

## 2018-08-19 DIAGNOSIS — S065X0A Traumatic subdural hemorrhage without loss of consciousness, initial encounter: Secondary | ICD-10-CM | POA: Diagnosis not present

## 2018-08-19 DIAGNOSIS — S065X9A Traumatic subdural hemorrhage with loss of consciousness of unspecified duration, initial encounter: Secondary | ICD-10-CM

## 2018-08-19 DIAGNOSIS — S065XAA Traumatic subdural hemorrhage with loss of consciousness status unknown, initial encounter: Secondary | ICD-10-CM

## 2018-08-21 DIAGNOSIS — G960 Cerebrospinal fluid leak: Secondary | ICD-10-CM | POA: Diagnosis not present

## 2018-08-21 DIAGNOSIS — S32009K Unspecified fracture of unspecified lumbar vertebra, subsequent encounter for fracture with nonunion: Secondary | ICD-10-CM | POA: Diagnosis not present

## 2018-08-26 DIAGNOSIS — I10 Essential (primary) hypertension: Secondary | ICD-10-CM | POA: Diagnosis not present

## 2018-08-26 DIAGNOSIS — E119 Type 2 diabetes mellitus without complications: Secondary | ICD-10-CM | POA: Diagnosis not present

## 2018-08-26 DIAGNOSIS — E785 Hyperlipidemia, unspecified: Secondary | ICD-10-CM | POA: Diagnosis not present

## 2018-08-28 DIAGNOSIS — I10 Essential (primary) hypertension: Secondary | ICD-10-CM | POA: Diagnosis not present

## 2018-08-28 DIAGNOSIS — E1140 Type 2 diabetes mellitus with diabetic neuropathy, unspecified: Secondary | ICD-10-CM | POA: Diagnosis not present

## 2018-08-28 DIAGNOSIS — N401 Enlarged prostate with lower urinary tract symptoms: Secondary | ICD-10-CM | POA: Diagnosis not present

## 2018-08-28 DIAGNOSIS — K219 Gastro-esophageal reflux disease without esophagitis: Secondary | ICD-10-CM | POA: Diagnosis not present

## 2018-08-28 DIAGNOSIS — Z0000 Encounter for general adult medical examination without abnormal findings: Secondary | ICD-10-CM | POA: Diagnosis not present

## 2018-08-28 DIAGNOSIS — G629 Polyneuropathy, unspecified: Secondary | ICD-10-CM | POA: Diagnosis not present

## 2018-08-28 DIAGNOSIS — E785 Hyperlipidemia, unspecified: Secondary | ICD-10-CM | POA: Diagnosis not present

## 2018-08-28 DIAGNOSIS — Z8679 Personal history of other diseases of the circulatory system: Secondary | ICD-10-CM | POA: Diagnosis not present

## 2018-08-28 DIAGNOSIS — E1149 Type 2 diabetes mellitus with other diabetic neurological complication: Secondary | ICD-10-CM | POA: Diagnosis not present

## 2018-08-28 DIAGNOSIS — M549 Dorsalgia, unspecified: Secondary | ICD-10-CM | POA: Diagnosis not present

## 2018-08-28 DIAGNOSIS — G309 Alzheimer's disease, unspecified: Secondary | ICD-10-CM | POA: Diagnosis not present

## 2018-11-01 ENCOUNTER — Other Ambulatory Visit: Payer: Self-pay | Admitting: Neurological Surgery

## 2018-11-01 DIAGNOSIS — G9608 Other cranial cerebrospinal fluid leak: Secondary | ICD-10-CM

## 2018-11-13 ENCOUNTER — Ambulatory Visit
Admission: RE | Admit: 2018-11-13 | Discharge: 2018-11-13 | Disposition: A | Discharge status: Home / Self Care | Payer: PPO | Source: Ambulatory Visit | Attending: Neurological Surgery | Admitting: Neurological Surgery

## 2018-11-13 DIAGNOSIS — R413 Other amnesia: Secondary | ICD-10-CM | POA: Diagnosis not present

## 2018-11-13 DIAGNOSIS — I6200 Nontraumatic subdural hemorrhage, unspecified: Secondary | ICD-10-CM | POA: Diagnosis not present

## 2018-11-13 DIAGNOSIS — G9608 Other cranial cerebrospinal fluid leak: Secondary | ICD-10-CM

## 2018-11-20 DIAGNOSIS — I6203 Nontraumatic chronic subdural hemorrhage: Secondary | ICD-10-CM | POA: Diagnosis not present

## 2018-11-27 ENCOUNTER — Other Ambulatory Visit: Payer: Self-pay

## 2018-11-27 ENCOUNTER — Ambulatory Visit: Payer: PPO | Admitting: Neurology

## 2018-11-27 ENCOUNTER — Encounter: Payer: Self-pay | Admitting: Neurology

## 2018-11-27 VITALS — BP 108/63 | HR 59 | Temp 97.4°F | Ht 73.0 in | Wt 173.0 lb

## 2018-11-27 DIAGNOSIS — G250 Essential tremor: Secondary | ICD-10-CM | POA: Diagnosis not present

## 2018-11-27 DIAGNOSIS — G301 Alzheimer's disease with late onset: Secondary | ICD-10-CM

## 2018-11-27 DIAGNOSIS — Z8679 Personal history of other diseases of the circulatory system: Secondary | ICD-10-CM | POA: Insufficient documentation

## 2018-11-27 DIAGNOSIS — F0280 Dementia in other diseases classified elsewhere without behavioral disturbance: Secondary | ICD-10-CM

## 2018-11-27 DIAGNOSIS — R413 Other amnesia: Secondary | ICD-10-CM

## 2018-11-27 MED ORDER — RIVASTIGMINE 9.5 MG/24HR TD PT24: 9.5000 mg | MEDICATED_PATCH | Freq: Every day | TRANSDERMAL | 3 refills | Status: DC

## 2018-11-27 NOTE — Progress Notes (Signed)
[facility]  PATIENT: Daniel [name] DOB: [birth year]  REFERRING DOCTOR OR PCP:  Deland Pretty SOURCE: patient, notes from Dr. Shelia Media, labs, imaging reports, CT image  _________________________________   HISTORICAL  CHIEF COMPLAINT:  Chief Complaint  Patient presents with  . Follow-up    pt with wife, rm 2. wife states that memory has declined since last here    HISTORY OF PRESENT ILLNESS:  Daniel [name] is a 83 y.o. man with memory loss.  Update 11/27/2018: He feels his short term memory is a little worse.   He does all his simple activities but his wife handles more complex tasks.   He reads but feels he is a little slower.   He watches quiz shows on TV.  He is on Exelon patches and he tolerates them well.    He scored 23/30 on the MoCA, losing all 5 points for delayed recall, one for clock hands and 1 for fluency (11 words).   He is a former Customer service manager.     He feels his walking/balance are doing the same.  He uses a cane outside the house but can walk without one.   He denies any new bladder/bowel issues (old frequency).     Vision is ok though he has ptosis due to old Bells OD.   He has a stable weight but seems to be eating worse.   He sleeps well most nights.  He does not take naps.   He and wife deny depression or anxiety.     Montreal Cognitive Assessment  11/27/2018 11/21/2017 06/08/2016 03/09/2016  Visuospatial/ Executive (0/5) 4 5 5 5   Naming (0/3) 3 3 3 3   Attention: Read list of digits (0/2) 2 2 2 2   Attention: Read list of letters (0/1) 1 1 1 1   Attention: Serial 7 subtraction starting at 100 (0/3) 3 1 3 3   Language: Repeat phrase (0/2) 2 2 2 2   Language : Fluency (0/1) 0 1 1 1   Abstraction (0/2) 2 2 2 2   Delayed Recall (0/5) 0 0 3 2  Orientation (0/6) 6 6 6 5   Total 23 23 28 26   Adjusted Score (based on education) - 23 28 26      Update 11/21/2017: He feels his memory is a little worse than last year.  He has more trouble with names and  dates.   He reads and sometimes gets online.    He feels his memory comprehension.    He has Exelon patches and tolerates them ok except mild diarrhea.     He is walking with his cane but has no recent falls.    He can walk a half mile with his cane.   No visual hallucinations.    He is sleeping well, with omset of only a minute or two.   He has urinary frequency and urgency so gets up a few times a night.   He falls right back asleep.      He does not talk in his sleep and no active dreams.        Update 11/16/2016:   Memory worsened after his spinal surgery (was under x 6 hours, reportedly) but has slowly returned practically back to the baseline before surgery.Marland Kitchen   He tolerated Exelon patches better than the Aricept.    He has not driven since the surgery.     He has trouble with recalling names and has decreased focus and attention.  He had fusion L3-L5 and he needed  dural repair.   He reports a headache, especially in the right neck.     The headache is most notable when he is sitting up a long time.       He falls asleep easily but wakes up a few times to urinate every night.    He does not have loud snoring    There are no REM behavior disorder symptoms.    From 06/08/2016:  At the initial visit, donepezil was started and he is doing better going from a MoCA score of 26 to 28.    He has had a lot of diarrhea.  This is inconsistent but occurs at least a few times a week and he has had fecal incontinence.        He often has feelings of Deja vu when they are out of the house or when watching TV.     As an example, they were looking at a map of [place] and he swore he had been there even though he had not.    He will be having lumbar surgery at L3-L5 soon by Dr. Ellene Route.   He sleeps well most nights.   He snores There are no witnessed gasps/snorts or pauses.      He does not have REM behavior disorder symptoms.  He drives and sometimes misses turns but has never gotten lost.  He denies  depression but is sometimes irritable.   He is diabetic and has mild diabetic polyneuropathy, helped by Lyrica.    He used to be very active and did a lot of dancing and ran 10K.        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   He saw Dr. Jannifer Franklin and had an evaluation with MMSE = 29.  The MRI brain was normal.   He did not worsen but still noted some difficulty at times.Marland Kitchen  He continued to work as an Optometrist.  A few years later he started to notice that memory was becoming more of a problem again.  He sold hs practice in 2014 because he felt there was too high of a chance that he would make errors.Marland Kitchen   He feels memory issues mildly worsened slowly over time since then..  I have reviewed the CT scan from 06/27/2015 and compared it with the MRI from 09/05/2004. The CT scan shows moderate cortical atrophy, probably more pronounced in the mesial temporal lobes. There also appears to be some chronic microvascular ischemic change. The atrophy has progressed since the MRI from 2006.   At his initial visit in early 2018, the MoCA score was 26/30.    Follow-up 06/04/2016 showed improved scores of 28/30. However, he was experiencing a lot of diarrhea. Donepezil was switched to Exelon.   His great grandmother had dementia in her late 56's.  His mother had dementia that started in her 42's and died of a stroke, 2 aunts had severe dementia.   An uncle also had dementia (mild).  A second uncle had very mild dementia (late 67's) but died of unrelatred issues a year later.       REVIEW OF SYSTEMS: Constitutional: No fevers, chills, sweats, or change in appetite Eyes: No visual changes, double vision, eye pain Ear, nose and throat: No hearing loss, ear pain, nasal congestion, sore throat Cardiovascular: No chest pain, palpitations Respiratory: No shortness of breath at rest or with exertion.   No wheezes  GastrointestinaI: No nausea, vomiting, diarrhea,  abdominal pain, fecal incontinence Genitourinary: No dysuria, urinary retention or frequency.  No nocturia. Musculoskeletal: LBP, recent PLIF L3-L5 Integumentary: No rash, pruritus, skin lesions Neurological: as above Psychiatric: No depression at this tim\\\\\\\\\\\\\\\\\\\\\\\\\\\\\\\\\\\\\\\\\\\\\\\\\\\\e.  No anxiety Endocrine: He has DM Type 2.  No palpitations, diaphoresis, change in appetite, change in weigh or increased thirst Hematologic/Lymphatic: No anemia, purpura, petechiae. Allergic/Immunologic: No itchy/runny eyes, nasal congestion, recent allergic reactions, rashes  ALLERGIES: Allergies  Allergen Reactions  . Donepezil Diarrhea  . Tape Rash    PAPER TAPE IS PREFERRED, PLEASE    HOME MEDICATIONS:  Current Outpatient Medications:  .  atorvastatin (LIPITOR) 40 MG tablet, Take 40 mg by mouth daily at 6 (six) AM. , Disp: , Rfl:  .  calcium-vitamin D (OSCAL WITH D) 500-200 MG-UNIT tablet, Take 1 tablet by mouth daily with breakfast., Disp: , Rfl:  .  celecoxib (CELEBREX) 200 MG capsule, Take 200 mg by mouth daily., Disp: , Rfl: 2 .  Cholecalciferol (CVS D3) 2000 units CAPS, Take 2,000 Units by mouth daily. , Disp: , Rfl:  .  dicyclomine (BENTYL) 20 MG tablet, TK 1 T PO TID PRN, Disp: , Rfl: 6 .  ezetimibe (ZETIA) 10 MG tablet, Take 1 tablet (10 mg total) by mouth every evening., Disp: , Rfl: 3 .  famotidine (PEPCID) 40 MG tablet, Take 40 mg by mouth 3 (three) times daily., Disp: , Rfl:  .  Multiple Minerals-Vitamins (CALCIUM-MAGNESIUM-ZINC-D3) TABS, Take 1 tablet by mouth daily. , Disp: , Rfl:  .  naproxen sodium (ALEVE) 220 MG tablet, Take 220 mg by mouth., Disp: , Rfl:  .  pregabalin (LYRICA) 75 MG capsule, TK ONE C PO BID, Disp: , Rfl:  .  ramipril (ALTACE) 2.5 MG capsule, Take 2.5 mg by mouth daily. , Disp: , Rfl:  .  rivastigmine (EXELON) 9.5 mg/24hr, Place 1 patch (9.5 mg total) onto the skin daily., Disp: 90 patch, Rfl: 3 .  sitaGLIPtin (JANUVIA) 50 MG tablet,  Take 50 mg by mouth daily., Disp: , Rfl:  .  tamsulosin (FLOMAX) 0.4 MG CAPS capsule, Take 0.4 mg by mouth at bedtime., Disp: , Rfl:   PAST MEDICAL HISTORY: Past Medical History:  Diagnosis Date  . Alzheimer disease ([place])   . Arthritis   . Cancer (HCC)    Skin  . Diabetes mellitus without complication ([place])   . GERD (gastroesophageal reflux disease)   . Hyperlipidemia   . Hypertension     PAST SURGICAL HISTORY: Past Surgical History:  Procedure Laterality Date  . BACK SURGERY    . BURR HOLE Bilateral 03/09/2017   Procedure: BURR HOLES FOR SUBDURAL HEMATOMA BILATERAL;  Surgeon: Consuella Lose, MD;  Location: [facility];  Service: Neurosurgery;  Laterality: Bilateral;  . CATARACT EXTRACTION Bilateral 1987 89  . ROTATOR CUFF REPAIR Left 2003    FAMILY HISTORY: Family History  Problem Relation Age of Onset  . Alzheimer's disease Mother   . Alzheimer's disease Maternal Grandfather     SOCIAL HISTORY:  Social History   Socioeconomic History  . Marital status: Married    Spouse name: Not on file  . Number of children: Not on file  . Years of education: Not on file  . Highest education level: Not on file  Occupational History  . Not on file  Social Needs  . Financial resource strain: Not on file  . Food insecurity    Worry: Not on file    Inability: Not on file  .  Transportation needs    Medical: Not on file    Non-medical: Not on file  Tobacco Use  . Smoking status: Former Smoker    Quit date: 01/16/1978    Years since quitting: 40.8  . Smokeless tobacco: Never Used  Substance and Sexual Activity  . Alcohol use: Yes    Comment: occasionally  . Drug use: No  . Sexual activity: Not on file  Lifestyle  . Physical activity    Days per week: Not on file    Minutes per session: Not on file  . Stress: Not on file  Relationships  . Social Herbalist on phone: Not on file    Gets together: Not on file    Attends religious service: Not on file    Active  member of club or organization: Not on file    Attends meetings of clubs or organizations: Not on file    Relationship status: Not on file  . Intimate partner violence    Fear of current or ex partner: Not on file    Emotionally abused: Not on file    Physically abused: Not on file    Forced sexual activity: Not on file  Other Topics Concern  . Not on file  Social History Narrative  . Not on file     PHYSICAL EXAM  Vitals:   11/27/18 1415  BP: 108/63  Pulse: (!) 59  Temp: (!) 97.4 F (36.3 C)  Weight: 173 lb (78.5 kg)  Height: 6\' 1"  (1.854 m)    Body mass index is 22.82 kg/m.   General: The patient is well-developed and well-nourished and in no acute distress   Neurologic Exam  Mental status: The patient is alert and oriented x 3. He scored 23/30 on the [facility] cognitive assessment, losing 5 points for memory. Today, he had normal attention span and concentration ability (DLROW; [id number]) Speech is normal.  Cranial nerves: Extraocular movements are full. Facial strength and sensation is normal. Trapezius and sternocleidomastoid strength is normal. No dysarthria is noted.  The tongue is midline, and the patient has symmetric elevation of the soft palate. No obvious hearing deficits are noted.  Motor: There is a mild 6-7 hz tremor in his right hand only.   Muscle bulk is normal.   Tone is normal. Strength is  5 / 5 in all 4 extremities.   Sensory: He has intact sensation to touch and vibration in the arms and proximal legs but reduced vibration sensation at the ankles. Coordination: Cerebellar testing reveals good finger-nose-finger and heel-to-shin bilaterally.  Gait and station: Station is normal. The gait has a normal stride for age. Tandem gait is wide.    Romberg is negative.   Reflexes: Deep tendon reflexes are symmetric and normal bilaterally.        ASSESSMENT AND PLAN  Late onset Alzheimer's disease without behavioral disturbance ([place])  Memory  loss  History of subdural hematoma  Essential tremor   1.   He has progressed slightly with his cognitive decline since a visit 1 year ago.. Continue Exelon patches. 2.    Continue to stay active and exercises tolerated. 3.    Return in 12 months or sooner if there are new or worsening neurologic symptoms.  Jerimey Burridge A. Felecia Shelling, MD, PhD [id number], [id number] PM Certified in Neurology, Clinical Neurophysiology, Sleep Medicine, Pain Medicine and Neuroimaging  [facility] [address], [place], [place] [address] [phone]

## 2019-02-06 DIAGNOSIS — E1149 Type 2 diabetes mellitus with other diabetic neurological complication: Secondary | ICD-10-CM | POA: Diagnosis not present

## 2019-02-06 DIAGNOSIS — E785 Hyperlipidemia, unspecified: Secondary | ICD-10-CM | POA: Diagnosis not present

## 2019-02-13 DIAGNOSIS — E785 Hyperlipidemia, unspecified: Secondary | ICD-10-CM | POA: Diagnosis not present

## 2019-02-13 DIAGNOSIS — I10 Essential (primary) hypertension: Secondary | ICD-10-CM | POA: Diagnosis not present

## 2019-02-13 DIAGNOSIS — E1149 Type 2 diabetes mellitus with other diabetic neurological complication: Secondary | ICD-10-CM | POA: Diagnosis not present

## 2019-03-08 IMAGING — CT CT HEAD W/O CM
1 series · 15 of 30 positions shown, 19 images · non-contrast
Comparison: Head CTs 10/24/2017 and earlier.

CLINICAL DATA: 83-year-old male with history of bilateral subdural
hematoma evacuation. Subsequent encounter.

EXAM:
CT HEAD WITHOUT CONTRAST
TECHNIQUE: Contiguous axial images were obtained from the base of the skull
through the vertex without intravenous contrast.

[Series 2: head w/(date) · axial · 0.44mm/px · z∈[-182,-27]mm · 15 of 35 slices shown, 19 images]
[im 2/35  brain]
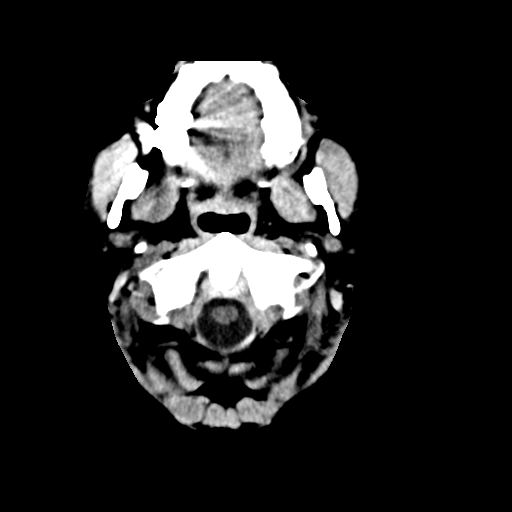
[im 2/35  bone]
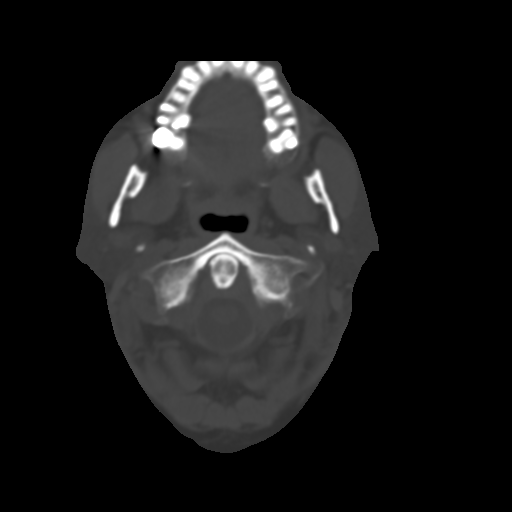
[im 4/35  brain]
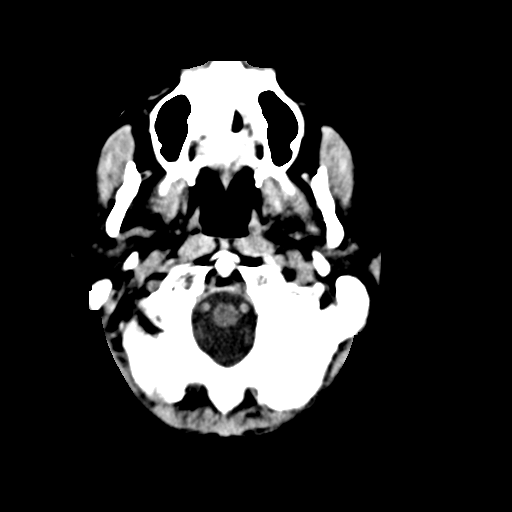
[im 6/35  brain]
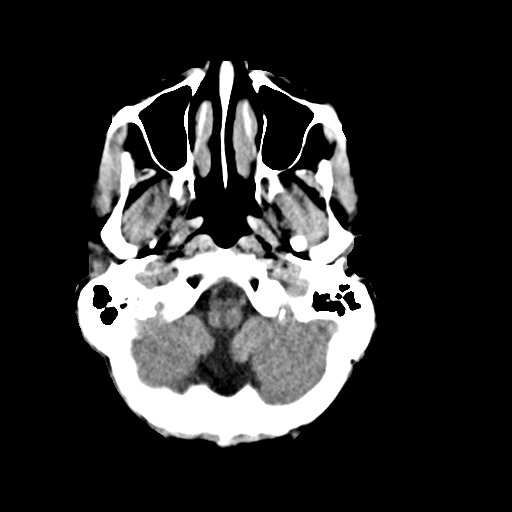
[im 9/35  brain]
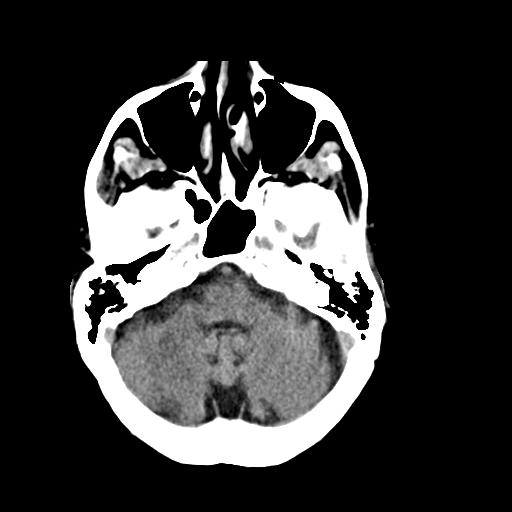
[im 11/35  brain]
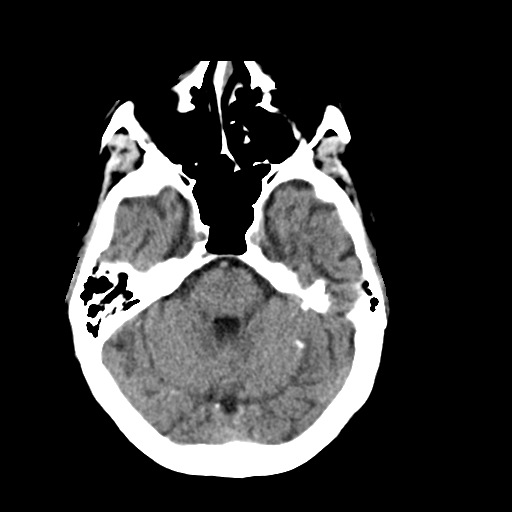
[im 11/35  bone]
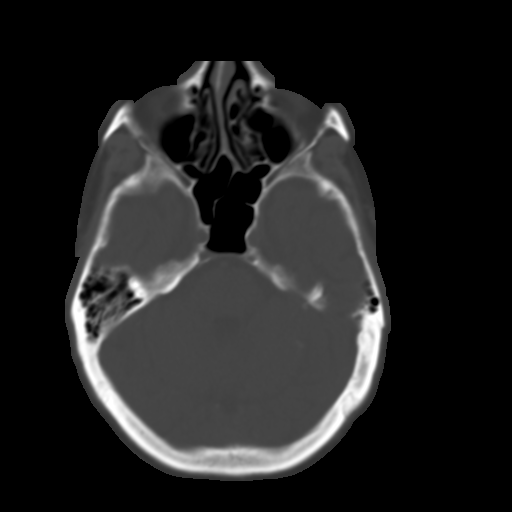
[im 13/35  brain]
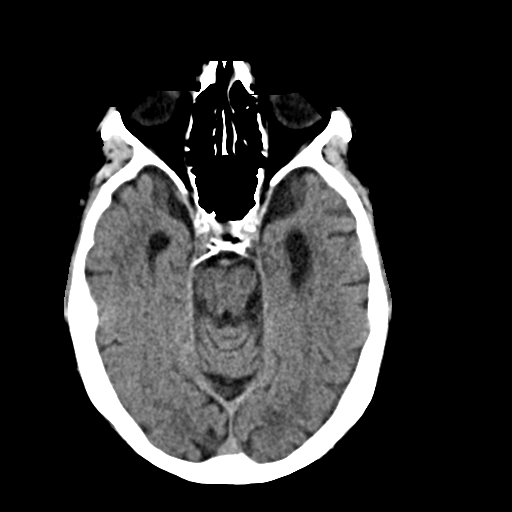
[im 16/35  brain]
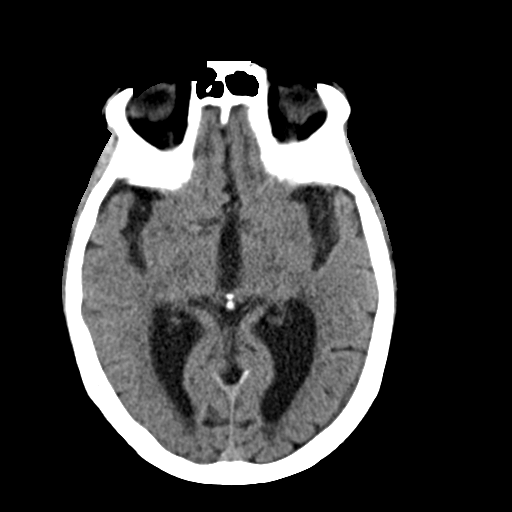
[im 18/35  brain]
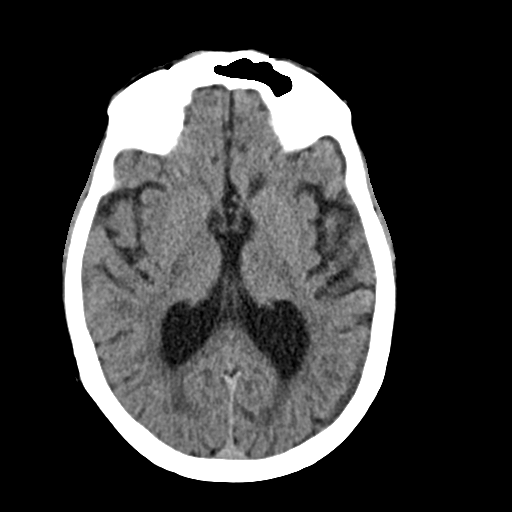
[im 19/35  brain]
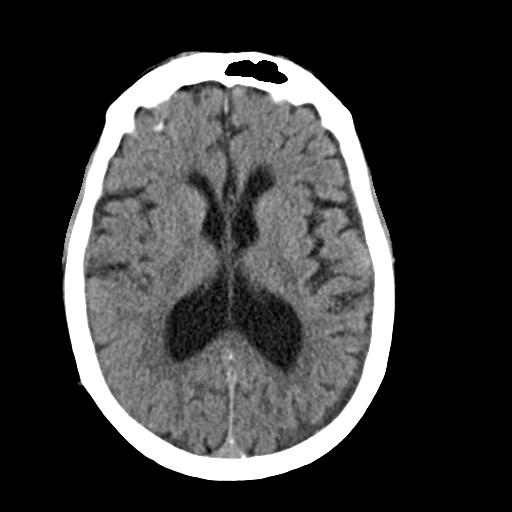
[im 19/35  bone]
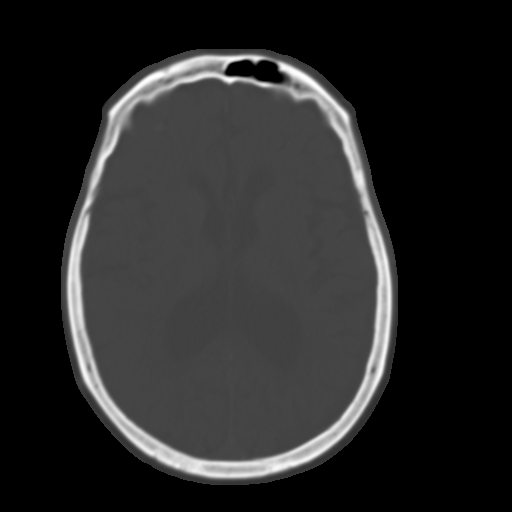
[im 22/35  brain]
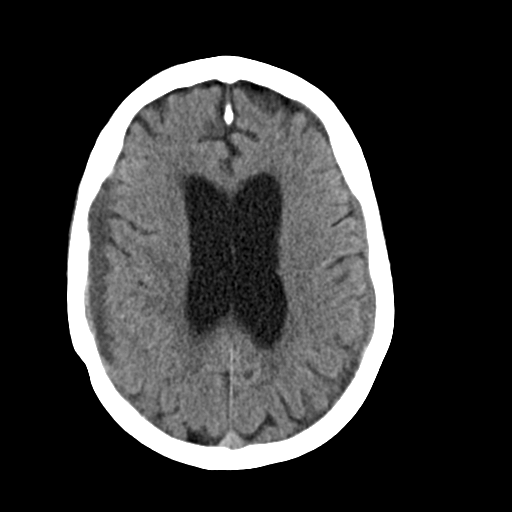
[im 24/35  brain]
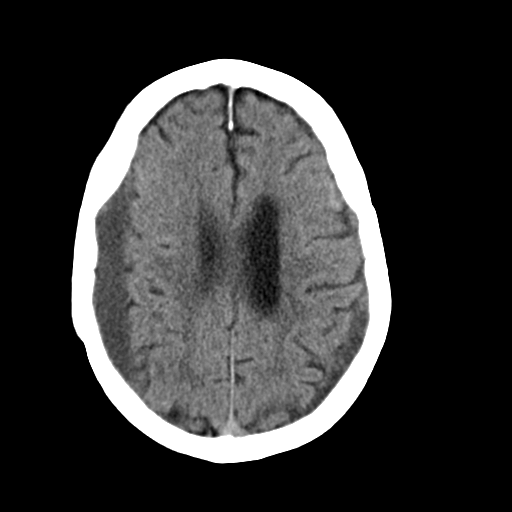
[im 26/35  brain]
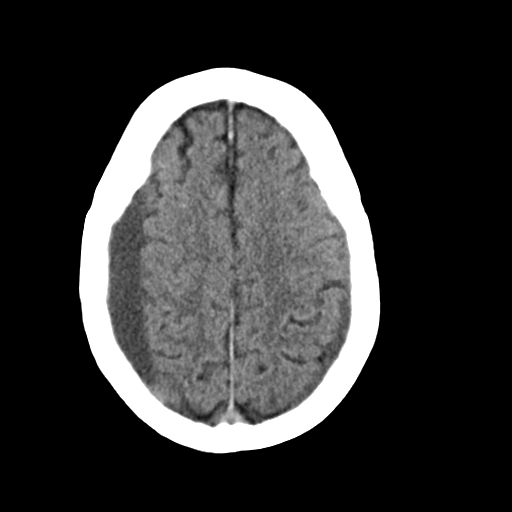
[im 29/35  brain]
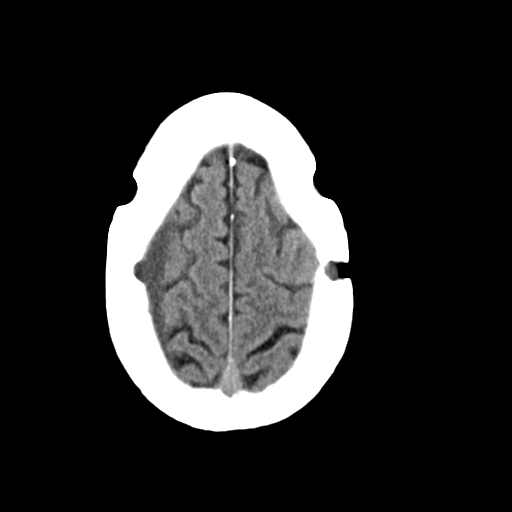
[im 29/35  bone]
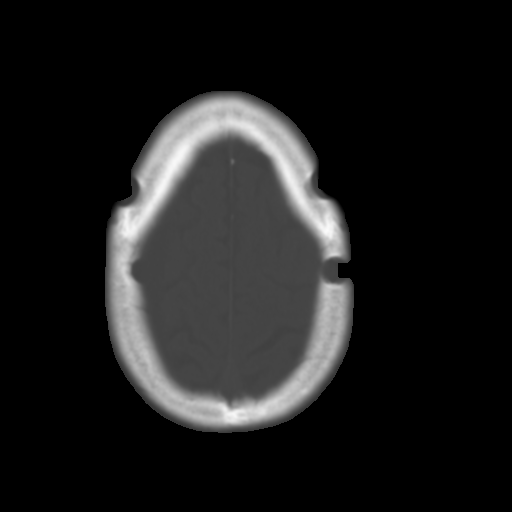
[im 31/35  brain]
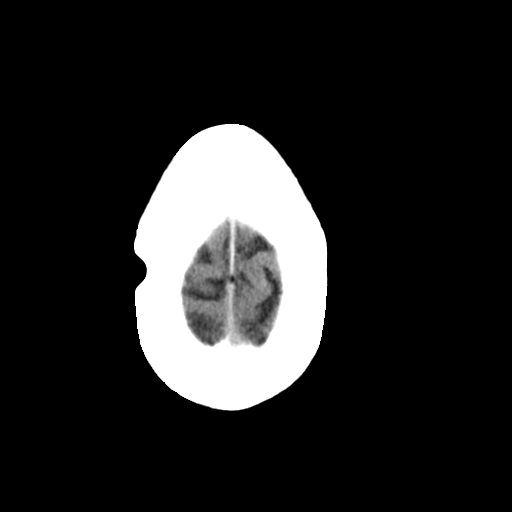
[im 33/35  brain]
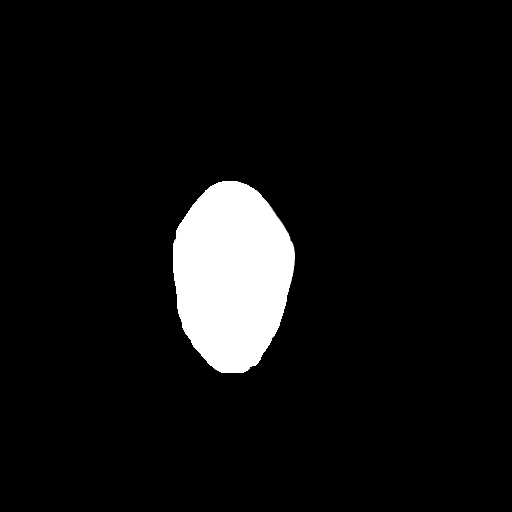

[15 of 30 positions shown; findings below may reference images not displayed]

FINDINGS: Brain: Persistent mostly low-density right side subdural hematoma
14-15 millimeters previously. A smaller more intermediate density
left side subdural measures 5 millimeters now (6-7 millimeters
previously).

Midline shift is trace to the left. Basilar cisterns remain normal.

Stable mild ventricular prominence. No new intracranial hemorrhage
identified. Stable gray-white matter differentiation throughout the
brain. No cortically based acute infarct identified.

Vascular: Calcified atherosclerosis at the skull base. No suspicious
intracranial vascular hyperdensity.

Skull: Stable. There are 2 superior convexity burr holes on each
side.

Sinuses/Orbits: Visualized paranasal sinuses and mastoids are stable
and well pneumatized.

Other: No acute orbit or scalp soft tissue findings.
IMPRESSION: 1. Low density right subdural hematomas now 11-12 mm (previously
14-15 mm).
Smaller and intermediate density left side subdural measures 5 mm
now (6-7 mm previously).
2. Trace leftward midline shift.
3. No new intracranial abnormality.

## 2019-03-20 ENCOUNTER — Ambulatory Visit: Payer: PPO | Attending: Internal Medicine

## 2019-03-20 DIAGNOSIS — Z23 Encounter for immunization: Secondary | ICD-10-CM | POA: Insufficient documentation

## 2019-03-20 NOTE — Progress Notes (Signed)
   Covid-19 Vaccination Clinic  Name:  Daniel [name]    MRN: [id number] DOB: [birth year]  03/20/2019  [name] was observed post Covid-19 immunization for 15 minutes without incident. He was provided with Vaccine Information Sheet and instruction to access the V-Safe system.   [name] was instructed to call 911 with any severe reactions post vaccine: Marland Kitchen Difficulty breathing  . Swelling of face and throat  . A fast heartbeat  . A bad rash all over body  . Dizziness and weakness   Immunizations Administered    Name Date Dose VIS Date Route   Pfizer COVID-19 Vaccine 03/20/2019  3:55 PM 0.3 mL 12/27/2018 Intramuscular   Manufacturer: [place]   Lot: [id number]   [place]: [id number]

## 2019-04-16 ENCOUNTER — Ambulatory Visit: Payer: PPO | Attending: Internal Medicine

## 2019-04-16 DIAGNOSIS — Z23 Encounter for immunization: Secondary | ICD-10-CM

## 2019-04-16 NOTE — Progress Notes (Signed)
   Covid-19 Vaccination Clinic  Name:  Daniel [name]    MRN: [id number] DOB: 23-Nov-1935  04/16/2019  [name] was observed post Covid-19 immunization for 15 minutes without incident. He was provided with Vaccine Information Sheet and instruction to access the V-Safe system.   [name] was instructed to call 911 with any severe reactions post vaccine: Marland Kitchen Difficulty breathing  . Swelling of face and throat  . A fast heartbeat  . A bad rash all over body  . Dizziness and weakness   Immunizations Administered    Name Date Dose VIS Date Route   Pfizer COVID-19 Vaccine 04/16/2019  3:40 PM 0.3 mL 12/27/2018 Intramuscular   Manufacturer: [employer], [employer]   Lot: [id number]   [place]: [id number]

## 2019-04-24 ENCOUNTER — Other Ambulatory Visit: Payer: Self-pay | Admitting: Neurological Surgery

## 2019-04-24 DIAGNOSIS — I6203 Nontraumatic chronic subdural hemorrhage: Secondary | ICD-10-CM

## 2019-05-05 ENCOUNTER — Other Ambulatory Visit: Payer: Self-pay | Admitting: Neurological Surgery

## 2019-05-05 DIAGNOSIS — I6203 Nontraumatic chronic subdural hemorrhage: Secondary | ICD-10-CM

## 2019-05-08 DIAGNOSIS — L814 Other melanin hyperpigmentation: Secondary | ICD-10-CM | POA: Diagnosis not present

## 2019-05-08 DIAGNOSIS — D1801 Hemangioma of skin and subcutaneous tissue: Secondary | ICD-10-CM | POA: Diagnosis not present

## 2019-05-08 DIAGNOSIS — L218 Other seborrheic dermatitis: Secondary | ICD-10-CM | POA: Diagnosis not present

## 2019-05-08 DIAGNOSIS — L570 Actinic keratosis: Secondary | ICD-10-CM | POA: Diagnosis not present

## 2019-05-08 DIAGNOSIS — L821 Other seborrheic keratosis: Secondary | ICD-10-CM | POA: Diagnosis not present

## 2019-05-09 ENCOUNTER — Ambulatory Visit
Admission: RE | Admit: 2019-05-09 | Discharge: 2019-05-09 | Disposition: A | Discharge status: Home / Self Care | Payer: PPO | Source: Ambulatory Visit | Attending: Neurological Surgery | Admitting: Neurological Surgery

## 2019-05-09 ENCOUNTER — Other Ambulatory Visit: Payer: Self-pay

## 2019-05-09 DIAGNOSIS — I6203 Nontraumatic chronic subdural hemorrhage: Secondary | ICD-10-CM

## 2019-05-09 DIAGNOSIS — I6200 Nontraumatic subdural hemorrhage, unspecified: Secondary | ICD-10-CM | POA: Diagnosis not present

## 2019-05-28 DIAGNOSIS — I6203 Nontraumatic chronic subdural hemorrhage: Secondary | ICD-10-CM | POA: Diagnosis not present

## 2019-08-07 DIAGNOSIS — E119 Type 2 diabetes mellitus without complications: Secondary | ICD-10-CM | POA: Diagnosis not present

## 2019-08-07 DIAGNOSIS — H52203 Unspecified astigmatism, bilateral: Secondary | ICD-10-CM | POA: Diagnosis not present

## 2019-08-07 DIAGNOSIS — Z961 Presence of intraocular lens: Secondary | ICD-10-CM | POA: Diagnosis not present

## 2019-08-07 DIAGNOSIS — Z7984 Long term (current) use of oral hypoglycemic drugs: Secondary | ICD-10-CM | POA: Diagnosis not present

## 2019-08-07 DIAGNOSIS — H524 Presbyopia: Secondary | ICD-10-CM | POA: Diagnosis not present

## 2019-08-12 ENCOUNTER — Other Ambulatory Visit: Payer: Self-pay

## 2019-08-14 DIAGNOSIS — E119 Type 2 diabetes mellitus without complications: Secondary | ICD-10-CM | POA: Diagnosis not present

## 2019-08-14 DIAGNOSIS — E1149 Type 2 diabetes mellitus with other diabetic neurological complication: Secondary | ICD-10-CM | POA: Diagnosis not present

## 2019-08-14 DIAGNOSIS — E785 Hyperlipidemia, unspecified: Secondary | ICD-10-CM | POA: Diagnosis not present

## 2019-08-14 DIAGNOSIS — I10 Essential (primary) hypertension: Secondary | ICD-10-CM | POA: Diagnosis not present

## 2019-08-27 DIAGNOSIS — E785 Hyperlipidemia, unspecified: Secondary | ICD-10-CM | POA: Diagnosis not present

## 2019-08-27 DIAGNOSIS — I10 Essential (primary) hypertension: Secondary | ICD-10-CM | POA: Diagnosis not present

## 2019-08-27 DIAGNOSIS — E119 Type 2 diabetes mellitus without complications: Secondary | ICD-10-CM | POA: Diagnosis not present

## 2019-09-03 DIAGNOSIS — M549 Dorsalgia, unspecified: Secondary | ICD-10-CM | POA: Diagnosis not present

## 2019-09-03 DIAGNOSIS — G309 Alzheimer's disease, unspecified: Secondary | ICD-10-CM | POA: Diagnosis not present

## 2019-09-03 DIAGNOSIS — E1149 Type 2 diabetes mellitus with other diabetic neurological complication: Secondary | ICD-10-CM | POA: Diagnosis not present

## 2019-09-03 DIAGNOSIS — E785 Hyperlipidemia, unspecified: Secondary | ICD-10-CM | POA: Diagnosis not present

## 2019-09-03 DIAGNOSIS — N401 Enlarged prostate with lower urinary tract symptoms: Secondary | ICD-10-CM | POA: Diagnosis not present

## 2019-09-03 DIAGNOSIS — I10 Essential (primary) hypertension: Secondary | ICD-10-CM | POA: Diagnosis not present

## 2019-09-03 DIAGNOSIS — Z0001 Encounter for general adult medical examination with abnormal findings: Secondary | ICD-10-CM | POA: Diagnosis not present

## 2019-09-03 DIAGNOSIS — K219 Gastro-esophageal reflux disease without esophagitis: Secondary | ICD-10-CM | POA: Diagnosis not present

## 2019-10-29 ENCOUNTER — Other Ambulatory Visit: Payer: Self-pay | Admitting: Neurological Surgery

## 2019-10-29 DIAGNOSIS — I6203 Nontraumatic chronic subdural hemorrhage: Secondary | ICD-10-CM

## 2019-11-26 ENCOUNTER — Other Ambulatory Visit: Payer: Self-pay

## 2019-11-26 ENCOUNTER — Ambulatory Visit
Admission: RE | Admit: 2019-11-26 | Discharge: 2019-11-26 | Disposition: A | Discharge status: Home / Self Care | Payer: PPO | Source: Ambulatory Visit | Attending: Neurological Surgery | Admitting: Neurological Surgery

## 2019-11-26 DIAGNOSIS — I6203 Nontraumatic chronic subdural hemorrhage: Secondary | ICD-10-CM

## 2019-11-26 DIAGNOSIS — I6201 Nontraumatic acute subdural hemorrhage: Secondary | ICD-10-CM | POA: Diagnosis not present

## 2019-11-26 DIAGNOSIS — G9389 Other specified disorders of brain: Secondary | ICD-10-CM | POA: Diagnosis not present

## 2019-11-26 DIAGNOSIS — R220 Localized swelling, mass and lump, head: Secondary | ICD-10-CM | POA: Diagnosis not present

## 2019-11-26 DIAGNOSIS — S065X0A Traumatic subdural hemorrhage without loss of consciousness, initial encounter: Secondary | ICD-10-CM | POA: Diagnosis not present

## 2019-11-27 ENCOUNTER — Ambulatory Visit: Payer: PPO | Admitting: Neurology

## 2019-11-28 DIAGNOSIS — I10 Essential (primary) hypertension: Secondary | ICD-10-CM | POA: Diagnosis not present

## 2019-11-28 DIAGNOSIS — I6203 Nontraumatic chronic subdural hemorrhage: Secondary | ICD-10-CM | POA: Diagnosis not present

## 2019-12-16 DIAGNOSIS — E119 Type 2 diabetes mellitus without complications: Secondary | ICD-10-CM | POA: Diagnosis not present

## 2019-12-16 DIAGNOSIS — I10 Essential (primary) hypertension: Secondary | ICD-10-CM | POA: Diagnosis not present

## 2020-01-04 ENCOUNTER — Other Ambulatory Visit: Payer: Self-pay | Admitting: Neurology

## 2020-01-05 ENCOUNTER — Other Ambulatory Visit: Payer: Self-pay | Admitting: Neurology

## 2020-03-04 ENCOUNTER — Ambulatory Visit: Payer: PPO | Admitting: Neurology

## 2020-03-04 ENCOUNTER — Encounter: Payer: Self-pay | Admitting: *Deleted

## 2020-03-04 ENCOUNTER — Other Ambulatory Visit: Payer: Self-pay

## 2020-03-04 VITALS — BP 122/72 | HR 53 | Ht 73.0 in | Wt 178.0 lb

## 2020-03-04 DIAGNOSIS — Z8679 Personal history of other diseases of the circulatory system: Secondary | ICD-10-CM

## 2020-03-04 DIAGNOSIS — G250 Essential tremor: Secondary | ICD-10-CM | POA: Diagnosis not present

## 2020-03-04 DIAGNOSIS — G629 Polyneuropathy, unspecified: Secondary | ICD-10-CM

## 2020-03-04 DIAGNOSIS — F0280 Dementia in other diseases classified elsewhere without behavioral disturbance: Secondary | ICD-10-CM

## 2020-03-04 DIAGNOSIS — G301 Alzheimer's disease with late onset: Secondary | ICD-10-CM | POA: Diagnosis not present

## 2020-03-04 DIAGNOSIS — R413 Other amnesia: Secondary | ICD-10-CM

## 2020-03-04 NOTE — Progress Notes (Signed)
[facility]  PATIENT: Daniel [name] DOB: 11/02/1935  REFERRING DOCTOR OR PCP:  Deland Pretty SOURCE: patient, notes from Dr. Shelia Media, labs, imaging reports, CT image  _________________________________   HISTORICAL  CHIEF COMPLAINT:  Chief Complaint  Patient presents with  . Follow-up    RM 11 with wife. Last seen 11/27/2018. Last [place]wolf 23/30. On Exelon. Ambulates with cane.     HISTORY OF PRESENT ILLNESS:  Daniel [name] is a 85 y.o. man with memory loss.  Update 03/04/2020: His wife notes that he is doing worse with short term memory.   He often forgets something he was told a few minutes earlier.    She also notes he needs to be reminded to change clothes.   He is on Exelon patches and he tolerates them well.   He has had a little more trouble with gait.    He is usibg a cane now and has been diagnosed with polyneuropathy.     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  He scored 25/30 on the MoCA today, better than last time 11/2018.  He lost all 5 points for delayed recall.  He has more numbness in his feet and now uses a cane all the time now to walk.  He has diabetes mellitus and labs have been stable.  He denies any new bladder/bowel issues (old frequency).     Vision is ok though he has ptosis due to old Bells OD.   He has a stable weight but seems to be eating worse.   He sleeps well most nights.  He does not take naps.   He and wife deny depression or anxiety.     Montreal Cognitive Assessment  03/04/2020 11/27/2018 11/21/2017 06/08/2016 03/09/2016  Visuospatial/ Executive (0/5) $RemoveBeforeD'5 4 5 5 5  'BBfwhWmeUOUHZa$ Naming (0/3) $RemoveBefo'3 3 3 3 3  'YjdUMVkzpfm$ Attention: Read list of digits (0/2) $RemoveBefo'2 2 2 2 2  'tmCHCzFufzW$ Attention: Read list of letters (0/1) $RemoveBefor'1 1 1 1 1  'SBZbEfFLPoIy$ Attention: Serial 7 subtraction starting at 100 (0/3) $RemoveB'3 3 1 3 3  'wYmzoRXw$ Language: Repeat phrase (0/2) $RemoveBefo'2 2 2 2 2  'LjKWJXuVueN$ Language : Fluency (0/1) 1 0 $R'1 1 1  'go$ Abstraction (0/2) $RemoveBeforeDEI'2 2 2 2 2  'sCQWKaCpSTPxfPFd$ Delayed Recall (0/5) 0 0 0 3 2  Orientation (0/6) $RemoveBeforeDEI'6 6 6 6 5   'wmzydiJxAAUkqfao$ Total $RemoveBeforeDE'25 23 23 28 26  'vJGQMVxBcBGESuW$ Adjusted Score (based on education) 25 - $Re'23 28 26       'EPE$ Initial history:    Daniel [name] was working as a Engineer, 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   He saw Dr. Jannifer Franklin and had an evaluation with MMSE = 29.  The MRI brain was normal.   He did not worsen but still noted some difficulty at times.Marland Kitchen  He continued to work as an Optometrist.  A few years later he started to notice that memory was becoming more of a problem again.  He sold hs practice in 2014 because he felt there was too high of a chance that he would make errors.Marland Kitchen   He feels memory issues mildly worsened slowly over time since then..  I have reviewed the CT scan from 06/27/2015 and compared it with the MRI from 09/05/2004. The CT scan shows moderate cortical atrophy, probably more pronounced in the mesial temporal lobes. There also appears to be some chronic microvascular ischemic change. The atrophy has progressed since the MRI from 2006.   At  his initial visit in early 2018, the MoCA score was 26/30.    Follow-up 06/04/2016 showed improved scores of 28/30. However, he was experiencing a lot of diarrhea. Donepezil was switched to Exelon.  He has tolerated that   His great grandmother had dementia in her late 109's.  His mother had dementia that started in her 26's and died of a stroke, 2 aunts had severe dementia.   An uncle also had dementia (mild).  A second uncle had very mild dementia (late 74's) but died of unrelatred issues a year later.       REVIEW OF SYSTEMS: Constitutional: No fevers, chills, sweats, or change in appetite Eyes: No visual changes, double vision, eye pain Ear, nose and throat: No hearing loss, ear pain, nasal congestion, sore throat Cardiovascular: No chest pain, palpitations Respiratory: No shortness of breath at rest or with exertion.   No wheezes GastrointestinaI: No nausea, vomiting, diarrhea, abdominal pain, fecal incontinence Genitourinary: No dysuria, urinary retention  or frequency.  No nocturia. Musculoskeletal: LBP, recent PLIF L3-L5 Integumentary: No rash, pruritus, skin lesions Neurological: as above Psychiatric: No depression at this tim\\\\\\\\\\\\\\\\\\\\\\\\\\\\\\\\\\\\\\\\\\\\\\\\\\\\e.  No anxiety Endocrine: He has DM Type 2.  No palpitations, diaphoresis, change in appetite, change in weigh or increased thirst Hematologic/Lymphatic: No anemia, purpura, petechiae. Allergic/Immunologic: No itchy/runny eyes, nasal congestion, recent allergic reactions, rashes  ALLERGIES: Allergies  Allergen Reactions  . Donepezil Diarrhea  . Tape Rash    PAPER TAPE IS PREFERRED, PLEASE    HOME MEDICATIONS:  Current Outpatient Medications:  .  atorvastatin (LIPITOR) 40 MG tablet, Take 40 mg by mouth daily at 6 (six) AM. , Disp: , Rfl:  .  calcium-vitamin D (OSCAL WITH D) 500-200 MG-UNIT tablet, Take 1 tablet by mouth daily with breakfast., Disp: , Rfl:  .  celecoxib (CELEBREX) 200 MG capsule, Take 200 mg by mouth daily., Disp: , Rfl: 2 .  Cholecalciferol 50 MCG (2000 UT) CAPS, Take 2,000 Units by mouth daily. , Disp: , Rfl:  .  dicyclomine (BENTYL) 20 MG tablet, TK 1 T PO TID PRN, Disp: , Rfl: 6 .  ezetimibe (ZETIA) 10 MG tablet, Take 1 tablet (10 mg total) by mouth every evening., Disp: , Rfl: 3 .  famotidine (PEPCID) 40 MG tablet, Take 40 mg by mouth 3 (three) times daily., Disp: , Rfl:  .  Multiple Minerals-Vitamins (CALCIUM-MAGNESIUM-ZINC-D3) TABS, Take 1 tablet by mouth daily. , Disp: , Rfl:  .  naproxen sodium (ALEVE) 220 MG tablet, Take 220 mg by mouth., Disp: , Rfl:  .  pregabalin (LYRICA) 75 MG capsule, TK ONE C PO BID, Disp: , Rfl:  .  ramipril (ALTACE) 2.5 MG capsule, Take 2.5 mg by mouth daily. , Disp: , Rfl:  .  rivastigmine (EXELON) 9.5 mg/24hr, APPLY 1 PATCH(9.5 MG) EXTERNALLY TO THE SKIN DAILY, Disp: 90 patch, Rfl: 0 .  sitaGLIPtin (JANUVIA) 50 MG tablet, Take 50 mg by mouth daily., Disp: , Rfl:  .  tamsulosin (FLOMAX) 0.4 MG CAPS capsule,  Take 0.4 mg by mouth at bedtime., Disp: , Rfl:   PAST MEDICAL HISTORY: Past Medical History:  Diagnosis Date  . Alzheimer disease ([place])   . Arthritis   . Cancer (HCC)    Skin  . Diabetes mellitus without complication ([place])   . GERD (gastroesophageal reflux disease)   . Hyperlipidemia   . Hypertension     PAST SURGICAL HISTORY: Past Surgical History:  Procedure Laterality Date  . BACK SURGERY    . BURR HOLE  Bilateral 03/09/2017   Procedure: BURR HOLES FOR SUBDURAL HEMATOMA BILATERAL;  Surgeon: Lisbeth Renshaw, MD;  Location: [facility] OR;  Service: Neurosurgery;  Laterality: Bilateral;  . CATARACT EXTRACTION Bilateral 1987 89  . ROTATOR CUFF REPAIR Left 2003    FAMILY HISTORY: Family History  Problem Relation Age of Onset  . Alzheimer's disease Mother   . Alzheimer's disease Maternal Grandfather     SOCIAL HISTORY:  Social History   Socioeconomic History  . Marital status: Married    Spouse name: Not on file  . Number of children: Not on file  . Years of education: 45  . Highest education level: Not on file  Occupational History  . Not on file  Tobacco Use  . Smoking status: Former Smoker    Quit date: 01/16/1978    Years since quitting: 42.1  . Smokeless tobacco: Never Used  Vaping Use  . Vaping Use: Never used  Substance and Sexual Activity  . Alcohol use: Yes    Comment: occasionally  . Drug use: No  . Sexual activity: Not on file  Other Topics Concern  . Not on file  Social History Narrative  . Not on file   Social Determinants of Health   Financial Resource Strain: Not on file  Food Insecurity: Not on file  Transportation Needs: Not on file  Physical Activity: Not on file  Stress: Not on file  Social Connections: Not on file  Intimate Partner Violence: Not on file     PHYSICAL EXAM  Vitals:   03/04/20 1435  BP: 122/72  Pulse: (!) 53  Weight: 178 lb (80.7 kg)  Height: 6\' 1"  (1.854 m)    Body mass index is 23.48 kg/m.   General: The  patient is well-developed and well-nourished and in no acute distress   Neurologic Exam  Mental status: The patient is alert and oriented x 3. He scored 25/30 on the [facility] cognitive assessment, losing 5 points for short term memory. Today, he had normal attention span and concentration ability ([id number]) Speech is normal.  Cranial nerves: Extraocular movements are full. Facial strength and sensation is normal. Trapezius and sternocleidomastoid strength is normal. No dysarthria is noted.    No obvious hearing deficits are noted.  Motor: There is a mild 6-7 hz tremor in his right hand only.   Muscle bulk is normal.   Tone is normal. Strength is  5 / 5 in all 4 extremities.   Sensory: He has intact sensation to touch and vibration in the arms and proximal legs but very reduced vibration sensation in toes and mildly reduced at ankles.    Coordination: Cerebellar testing reveals good finger-nose-finger and heel-to-shin bilaterally.  Gait and station: Station is normal. The gait has a normal stride for age. Tandem gait is wide.    Romberg is negative.   Reflexes: Deep tendon reflexes are symmetric and normal bilaterally.        ASSESSMENT AND PLAN  Late onset Alzheimer's disease without behavioral disturbance (HCC)  Memory loss - Plan: Vitamin B12, Multiple Myeloma Panel (SPEP&IFE w/QIG)  History of subdural hematoma  Essential tremor  Polyneuropathy - Plan: Vitamin B12, Multiple Myeloma Panel (SPEP&IFE w/QIG), Sjogren's syndrome antibods(ssa + ssb)   1.   He is doing well for the most part with no worsening of the MoCA score (actually stable over 4 years). Continue Exelon patches. 2.    Continue to stay active and exercises tolerated. 3.    Polyneuropathy labs. 4.   Return in  12 months or sooner if there are new or worsening neurologic symptoms.  45-minute office visit with the majority of the time spent face-to-face for history and physical, discussion/counseling and  decision-making.  Additional time with record review and documentation.  Senay Sistrunk A. Felecia Shelling, MD, PhD 4/91/7915, 0:56 PM Certified in Neurology, [place] Neurophysiology, Sleep Medicine, Pain Medicine and Neuroimaging  [facility] [address] [place], [place] [address] [phone]

## 2020-03-09 ENCOUNTER — Telehealth: Payer: Self-pay | Admitting: *Deleted

## 2020-03-09 LAB — MULTIPLE MYELOMA PANEL, SERUM
Albumin SerPl Elph-Mcnc: 4.1 g/dL (ref 2.9–4.4)
Albumin/Glob SerPl: 1.6 (ref 0.7–1.7)
Alpha 1: 0.2 g/dL (ref 0.0–0.4)
Alpha2 Glob SerPl Elph-Mcnc: 0.8 g/dL (ref 0.4–1.0)
B-Globulin SerPl Elph-Mcnc: 0.9 g/dL (ref 0.7–1.3)
Gamma Glob SerPl Elph-Mcnc: 0.8 g/dL (ref 0.4–1.8)
Globulin, Total: 2.7 g/dL (ref 2.2–3.9)
IgA/Immunoglobulin A, Serum: 232 mg/dL (ref 61–437)
IgG (Immunoglobin G), Serum: 833 mg/dL (ref 603–1613)
IgM (Immunoglobulin M), Srm: 122 mg/dL (ref 15–143)
Total Protein: 6.8 g/dL (ref 6.0–8.5)

## 2020-03-09 LAB — SJOGREN'S SYNDROME ANTIBODS(SSA + SSB)
ENA SSA (RO) Ab: <0.2 AI (ref 0.0–0.9)
ENA SSB (LA) Ab: <0.2 AI (ref 0.0–0.9)

## 2020-03-09 LAB — VITAMIN B12: Vitamin B-12: 480 pg/mL (ref 232–1245)

## 2020-03-09 NOTE — Telephone Encounter (Signed)
-----   Message from Britt Bottom, MD sent at 03/09/2020  8:25 AM EST ----- Please let the patient know that the lab work is fine.

## 2020-03-09 NOTE — Telephone Encounter (Signed)
LVM for pt about lab results per Dr. Felecia Shelling note. Advised he did not need to call back unless he had further questions/concerns.

## 2020-04-05 ENCOUNTER — Other Ambulatory Visit: Payer: Self-pay | Admitting: Neurology

## 2020-04-23 ENCOUNTER — Other Ambulatory Visit: Payer: Self-pay | Admitting: Neurological Surgery

## 2020-04-23 DIAGNOSIS — I6203 Nontraumatic chronic subdural hemorrhage: Secondary | ICD-10-CM

## 2020-05-07 ENCOUNTER — Ambulatory Visit
Admission: RE | Admit: 2020-05-07 | Discharge: 2020-05-07 | Disposition: A | Discharge status: Home / Self Care | Payer: PPO | Source: Ambulatory Visit | Attending: Neurological Surgery | Admitting: Neurological Surgery

## 2020-05-07 DIAGNOSIS — I6203 Nontraumatic chronic subdural hemorrhage: Secondary | ICD-10-CM | POA: Diagnosis not present

## 2020-05-07 DIAGNOSIS — G9389 Other specified disorders of brain: Secondary | ICD-10-CM | POA: Diagnosis not present

## 2020-05-07 DIAGNOSIS — R220 Localized swelling, mass and lump, head: Secondary | ICD-10-CM | POA: Diagnosis not present

## 2020-05-07 DIAGNOSIS — I6201 Nontraumatic acute subdural hemorrhage: Secondary | ICD-10-CM | POA: Diagnosis not present

## 2020-06-21 DIAGNOSIS — G629 Polyneuropathy, unspecified: Secondary | ICD-10-CM | POA: Diagnosis not present

## 2020-06-21 DIAGNOSIS — E1165 Type 2 diabetes mellitus with hyperglycemia: Secondary | ICD-10-CM | POA: Diagnosis not present

## 2020-08-04 DIAGNOSIS — I10 Essential (primary) hypertension: Secondary | ICD-10-CM | POA: Diagnosis not present

## 2020-08-10 DIAGNOSIS — Z961 Presence of intraocular lens: Secondary | ICD-10-CM | POA: Diagnosis not present

## 2020-08-10 DIAGNOSIS — H52203 Unspecified astigmatism, bilateral: Secondary | ICD-10-CM | POA: Diagnosis not present

## 2020-08-10 DIAGNOSIS — H524 Presbyopia: Secondary | ICD-10-CM | POA: Diagnosis not present

## 2020-08-10 DIAGNOSIS — E119 Type 2 diabetes mellitus without complications: Secondary | ICD-10-CM | POA: Diagnosis not present

## 2020-08-23 DIAGNOSIS — K589 Irritable bowel syndrome without diarrhea: Secondary | ICD-10-CM | POA: Diagnosis not present

## 2020-09-21 DIAGNOSIS — E1165 Type 2 diabetes mellitus with hyperglycemia: Secondary | ICD-10-CM | POA: Diagnosis not present

## 2020-09-27 DIAGNOSIS — I10 Essential (primary) hypertension: Secondary | ICD-10-CM | POA: Diagnosis not present

## 2020-09-27 DIAGNOSIS — E1165 Type 2 diabetes mellitus with hyperglycemia: Secondary | ICD-10-CM | POA: Diagnosis not present

## 2020-09-27 DIAGNOSIS — G629 Polyneuropathy, unspecified: Secondary | ICD-10-CM | POA: Diagnosis not present

## 2020-10-01 ENCOUNTER — Other Ambulatory Visit: Payer: Self-pay | Admitting: Neurological Surgery

## 2020-10-01 DIAGNOSIS — I6203 Nontraumatic chronic subdural hemorrhage: Secondary | ICD-10-CM

## 2020-11-03 DIAGNOSIS — R030 Elevated blood-pressure reading, without diagnosis of hypertension: Secondary | ICD-10-CM | POA: Diagnosis not present

## 2020-11-03 DIAGNOSIS — I6203 Nontraumatic chronic subdural hemorrhage: Secondary | ICD-10-CM | POA: Diagnosis not present

## 2020-11-15 DIAGNOSIS — M159 Polyosteoarthritis, unspecified: Secondary | ICD-10-CM | POA: Diagnosis not present

## 2020-11-15 DIAGNOSIS — I10 Essential (primary) hypertension: Secondary | ICD-10-CM | POA: Diagnosis not present

## 2020-11-15 DIAGNOSIS — E119 Type 2 diabetes mellitus without complications: Secondary | ICD-10-CM | POA: Diagnosis not present

## 2020-11-15 DIAGNOSIS — E785 Hyperlipidemia, unspecified: Secondary | ICD-10-CM | POA: Diagnosis not present

## 2020-12-15 DIAGNOSIS — I10 Essential (primary) hypertension: Secondary | ICD-10-CM | POA: Diagnosis not present

## 2020-12-15 DIAGNOSIS — E119 Type 2 diabetes mellitus without complications: Secondary | ICD-10-CM | POA: Diagnosis not present

## 2020-12-15 DIAGNOSIS — E785 Hyperlipidemia, unspecified: Secondary | ICD-10-CM | POA: Diagnosis not present

## 2020-12-15 DIAGNOSIS — M159 Polyosteoarthritis, unspecified: Secondary | ICD-10-CM | POA: Diagnosis not present

## 2021-03-07 ENCOUNTER — Ambulatory Visit: Payer: PPO | Admitting: Family Medicine

## 2021-03-07 ENCOUNTER — Encounter: Payer: Self-pay | Admitting: Family Medicine

## 2021-03-07 VITALS — BP 143/80 | HR 75 | Ht 73.0 in | Wt 166.0 lb

## 2021-03-07 DIAGNOSIS — F0280 Dementia in other diseases classified elsewhere without behavioral disturbance: Secondary | ICD-10-CM

## 2021-03-07 DIAGNOSIS — G301 Alzheimer's disease with late onset: Secondary | ICD-10-CM | POA: Diagnosis not present

## 2021-03-07 NOTE — Progress Notes (Signed)
Chief Complaint  Patient presents with   Follow-up    Rm 1, w wife. Here for yearly AD f/u. Ambulates w cane. Comprehension has continued to decrease. Memory slightly worse some days. MOCA:20     HISTORY OF PRESENT ILLNESS:  03/07/21 ALL:  Daniel [name] is a 86 y.o. male here today for follow up for AD. He continues Exelon patches 9.5mg  and tolerating well. He presents with his wife who aids in history. She feels that he continues to have progressive cognitive decline. It is harder for him to retain information. He used to do the laundry but unable to learn how to use new washing machine. He has trouble with recall. He continues to perform ADLs independently. He is not as particular about his clothing. He does not drive. Mrs Ramnauth assists with medications. He is able to walk steps in home. He uses a cane. Appetite is good but he prefers more sweets.    HISTORY (copied from Dr Garth Bigness previous note)  Daniel [name] is a 86 y.o. man with memory loss.   Update 03/04/2020: His wife notes that he is doing worse with short term memory.   He often forgets something he was told a few minutes earlier.    She also notes he needs to be reminded to change clothes.   He is on Exelon patches and he tolerates them well.   He has had a little more trouble with gait.    He is usibg a cane now and has been diagnosed with polyneuropathy.      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   He scored 25/30 on the MoCA today, better than last time 11/2018.  He lost all 5 points for delayed recall.   He has more numbness in his feet and now uses a cane all the time now to walk.  He has diabetes mellitus and labs have been stable.  He denies any new bladder/bowel issues (old frequency).     Vision is ok though he has ptosis due to old Bells OD.   He has a stable weight but seems to be eating worse.   He sleeps well most nights.  He does not take naps.   He and wife deny depression or anxiety.       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   He saw Dr. Jannifer Franklin and had an evaluation with MMSE = 29.  The MRI brain was normal.   He did not worsen but still noted some difficulty at times.Marland Kitchen  He continued to work as an Optometrist.  A few years later  he started to notice that memory was becoming more of a problem again.  He sold hs practice in 2014 because he felt there was too high of a chance that he would make errors.Marland Kitchen   He feels memory issues mildly worsened slowly over time since then..  I have reviewed the CT scan from 06/27/2015 and compared it with the MRI from 09/05/2004. The CT scan shows moderate cortical atrophy, probably more pronounced in the mesial temporal lobes. There also appears to be some chronic microvascular ischemic change. The atrophy has progressed since the MRI from 2006. At his initial visit in early 2018, the MoCA score was 26/30. Follow-up 06/04/2016 showed improved scores of 28/30. However, he was experiencing a lot of diarrhea. Donepezil was switched to Exelon.  He has tolerated that   His great grandmother had dementia in her late 100's.  His mother had dementia that started in her 69's and died of a stroke, 2 aunts had severe dementia.   An uncle also had dementia (mild).  A second uncle had very mild dementia (late 31's) but died of unrelatred issues a year  later.   REVIEW OF SYSTEMS: Out of a complete 14 system review of symptoms, the patient complains only of the following symptoms, memory loss, frequent bowel movements, and all other reviewed systems are negative.   ALLERGIES: Allergies  Allergen Reactions   Donepezil Diarrhea   Tape Rash    PAPER TAPE IS PREFERRED, PLEASE     HOME MEDICATIONS: Outpatient Medications Prior to Visit  Medication Sig Dispense Refill   atorvastatin (LIPITOR) 40 MG tablet Take 40 mg by mouth daily at 6 (six) AM.      calcium-vitamin D (OSCAL WITH D) 500-200 MG-UNIT tablet Take 1 tablet by mouth daily with breakfast.     celecoxib (CELEBREX) 200 MG capsule Take 200 mg by mouth daily.  2   Cholecalciferol 50 MCG (2000 UT) CAPS Take 2,000 Units by mouth daily.      dicyclomine (BENTYL) 20 MG tablet TK 1 T PO TID PRN  6   empagliflozin (JARDIANCE) 10 MG TABS tablet Take 1 tablet by mouth daily.     ezetimibe (ZETIA) 10 MG tablet Take 1 tablet (10 mg total) by mouth every evening.  3   famotidine (PEPCID) 40 MG tablet Take 40 mg by mouth 3 (three) times daily.     Multiple Minerals-Vitamins (CALCIUM-MAGNESIUM-ZINC-D3) TABS Take 1 tablet by mouth daily.      naproxen sodium (ALEVE) 220 MG tablet Take 220 mg by mouth.     pregabalin (LYRICA) 75 MG capsule TK ONE C PO BID     ramipril (ALTACE) 2.5 MG capsule Take 2.5 mg by mouth daily.      rivastigmine (EXELON) 9.5 mg/24hr APPLY 1 PATCH(9.5 MG) TOPICALLY TO THE SKIN DAILY 90 patch 3   sitaGLIPtin (JANUVIA) 50 MG tablet Take 50 mg by mouth daily.     tamsulosin (FLOMAX) 0.4 MG CAPS capsule Take 0.4 mg by mouth at bedtime.     No facility-administered medications prior to visit.     PAST MEDICAL HISTORY: Past Medical History:  Diagnosis Date   Alzheimer disease ([place])    Arthritis    Cancer ([place])    Skin   Diabetes mellitus without complication ([place])    GERD (gastroesophageal reflux disease)    Hyperlipidemia    Hypertension      PAST SURGICAL  HISTORY: Past Surgical History:  Procedure Laterality Date   BACK SURGERY  BURR HOLE Bilateral 03/09/2017   Procedure: BURR HOLES FOR SUBDURAL HEMATOMA BILATERAL;  Surgeon: Consuella Lose, MD;  Location: [facility];  Service: Neurosurgery;  Laterality: Bilateral;   CATARACT EXTRACTION Bilateral 1987 89   ROTATOR CUFF REPAIR Left 2003     FAMILY HISTORY: Family History  Problem Relation Age of Onset   Alzheimer's disease Mother    Alzheimer's disease Maternal Grandfather      SOCIAL HISTORY: Social History   Socioeconomic History   Marital status: Married    Spouse name: Not on file   Number of children: Not on file   Years of education: 20   Highest education level: Not on file  Occupational History   Not on file  Tobacco Use   Smoking status: Former    Types: Cigarettes    Quit date: 01/16/1978    Years since quitting: 43.1   Smokeless tobacco: Never  Vaping Use   Vaping Use: Never used  Substance and Sexual Activity   Alcohol use: Yes    Comment: occasionally   Drug use: No   Sexual activity: Not on file  Other Topics Concern   Not on file  Social History Narrative   Not on file   Social Determinants of Health   Financial Resource Strain: Not on file  Food Insecurity: Not on file  Transportation Needs: Not on file  Physical Activity: Not on file  Stress: Not on file  Social Connections: Not on file  Intimate Partner Violence: Not on file     PHYSICAL EXAM  Vitals:   03/07/21 1430  BP: (!) 143/80  Pulse: 75  Weight: 166 lb (75.3 kg)  Height: 6\' 1"  (1.854 m)   Body mass index is 21.9 kg/m.  Generalized: Well developed, in no acute distress  Cardiology: normal rate and rhythm, no murmur auscultated  Respiratory: clear to auscultation bilaterally    Neurological examination  Mentation: Alert oriented to time, place, history taking. Follows all commands speech and language fluent Cranial nerve II-XII: Pupils were equal round reactive to  light. Extraocular movements were full, visual field were full on confrontational test. Facial sensation and strength were normal. Uvula tongue midline. Head turning and shoulder shrug  were normal and symmetric. Motor: The motor testing reveals 5 over 5 strength of all 4 extremities. Good symmetric motor tone is noted throughout.  Sensory: Sensory testing is intact to soft touch on all 4 extremities. No evidence of extinction is noted.  Coordination: Cerebellar testing reveals good finger-nose-finger and heel-to-shin bilaterally.  Gait and station: Gait is normal. Tandem gait is normal. Romberg is negative. No drift is seen.  Reflexes: Deep tendon reflexes are symmetric and normal bilaterally.    DIAGNOSTIC DATA (LABS, IMAGING, TESTING) - I reviewed patient records, labs, notes, testing and imaging myself where available.  Lab Results  Component Value Date   WBC 11.2 (H) 03/26/2017   HGB 13.6 03/26/2017   HCT 42.2 03/26/2017   MCV 90.4 03/26/2017   PLT 334 03/26/2017      Component Value Date/Time   NA 136 03/26/2017 1204   K 4.1 03/26/2017 1204   CL 99 (L) 03/26/2017 1204   CO2 24 03/26/2017 1204   GLUCOSE 129 (H) 03/26/2017 1204   BUN 20 03/26/2017 1204   CREATININE 0.86 03/26/2017 1204   CALCIUM 10.2 03/26/2017 1204   PROT 6.8 03/04/2020 1548   ALBUMIN 3.3 (L) 03/14/2017 0621   AST 22 03/14/2017 0621   ALT 15 (L) 03/14/2017 7829  ALKPHOS 72 03/14/2017 0621   BILITOT 0.9 03/14/2017 0621   GFRNONAA >60 03/26/2017 1204   GFRAA >60 03/26/2017 1204   No results found for: CHOL, HDL, LDLCALC, LDLDIRECT, TRIG, CHOLHDL Lab Results  Component Value Date   HGBA1C 6.9 (H) 09/06/2016   Lab Results  Component Value Date   VITAMINB12 480 03/04/2020   Lab Results  Component Value Date   TSH 1.910 03/09/2016    No flowsheet data found.   Montreal Cognitive Assessment  03/07/2021 03/04/2020 11/27/2018 11/21/2017 06/08/2016  Visuospatial/ Executive (0/5) 5 5 4 5 5   Naming  (0/3) 2 3 3 3 3   Attention: Read list of digits (0/2) 2 2 2 2 2   Attention: Read list of letters (0/1) 1 1 1 1 1   Attention: Serial 7 subtraction starting at 100 (0/3) 3 3 3 1 3   Language: Repeat phrase (0/2) 2 2 2 2 2   Language : Fluency (0/1) 1 1 0 1 1  Abstraction (0/2) 2 2 2 2 2   Delayed Recall (0/5) 0 0 0 0 3  Orientation (0/6) 2 6 6 6 6   Total 20 25 23 23 28   Adjusted Score (based on education) - 25 - 23 28     ASSESSMENT AND PLAN  86 y.o. year old male  has a past medical history of Alzheimer disease ([place]), Arthritis, Cancer ([place]), Diabetes mellitus without complication ([place]), GERD (gastroesophageal reflux disease), Hyperlipidemia, and Hypertension. here with    Late onset Alzheimer's disease without behavioral disturbance [facility])  Mr Landress continues to note progressive memory loss. MOCA 20/30, last 25/30 in 02/2020. He lost all points in recall and 4 points in orientation. He appears to be tolerating Exelon patches. We will continue 9.5mg  daily. We have discussed adding memantine, however, he has a very sensitive stomach with frequent bowel movements. I am unsure how well memantine would be tolerated. Educational material given for review. Mr and Mrs Marcott will discuss with care team. He was encouraged to continue physical and mental exercise every day. He was advised not to drive. Memory compensation strategies reviewed. I will have him follow up in 6 months, sooner if needed. He and his wife verbalize understanding and agreement with this plan.    No orders of the defined types were placed in this encounter.    No orders of the defined types were placed in this encounter.     Debbora Presto, MSN, FNP-C 03/07/2021, 2:51 PM  [facility] [address], [place], [place] [address] [phone]

## 2021-03-07 NOTE — Patient Instructions (Signed)
Below is our plan:  We will continue Exelon 9.5mg  patch daily. We can consider Namenda (memantine) in the future if needed. Please continue working on regular physical and mental exercise.   Please make sure you are staying well hydrated. I recommend 50-60 ounces daily. Well balanced diet and regular exercise encouraged. Consistent sleep schedule with 6-8 hours recommended.   Please continue follow up with care team as directed.   Follow up with me in 6 months   You may receive a survey regarding today's visit. I encourage you to leave honest feed back as I do use this information to improve patient care. Thank you for seeing me today!   Management of Memory Problems   There are some general things you can do to help manage your memory problems.  Your memory may not in fact recover, but by using techniques and strategies you will be able to manage your memory difficulties better.   1)  Establish a routine. Try to establish and then stick to a regular routine.  By doing this, you will get used to what to expect and you will reduce the need to rely on your memory.  Also, try to do things at the same time of day, such as taking your medication or checking your calendar first thing in the morning. Think about think that you can do as a part of a regular routine and make a list.  Then enter them into a daily planner to remind you.  This will help you establish a routine.   2)  Organize your environment. Organize your environment so that it is uncluttered.  Decrease visual stimulation.  Place everyday items such as keys or cell phone in the same place every day (ie.  Basket next to front door) Use post it notes with a brief message to yourself (ie. Turn off light, lock the door) Use labels to indicate where things go (ie. Which cupboards are for food, dishes, etc.) Keep a notepad and pen by the telephone to take messages   3)  Memory Aids A diary or journal/notebook/daily planner Making a list  (shopping list, chore list, to do list that needs to be done) Using an alarm as a reminder (kitchen timer or cell phone alarm) Using cell phone to store information (Notes, Calendar, Reminders) Calendar/White board placed in a prominent position Post-it notes   In order for memory aids to be useful, you need to have good habits.  It's no good remembering to make a note in your journal if you don't remember to look in it.  Try setting aside a certain time of day to look in journal.   4)  Improving mood and managing fatigue. There may be other factors that contribute to memory difficulties.  Factors, such as anxiety, depression and tiredness can affect memory. Regular gentle exercise can help improve your mood and give you more energy. Simple relaxation techniques may help relieve symptoms of anxiety Try to get back to completing activities or hobbies you enjoyed doing in the past. Learn to pace yourself through activities to decrease fatigue. Find out about some local support groups where you can share experiences with others. Try and achieve 7-8 hours of sleep at night.

## 2021-04-19 ENCOUNTER — Other Ambulatory Visit: Payer: Self-pay | Admitting: Neurology

## 2021-08-03 DIAGNOSIS — I6203 Nontraumatic chronic subdural hemorrhage: Secondary | ICD-10-CM | POA: Diagnosis not present

## 2021-08-10 DIAGNOSIS — Z7984 Long term (current) use of oral hypoglycemic drugs: Secondary | ICD-10-CM | POA: Diagnosis not present

## 2021-08-10 DIAGNOSIS — H5213 Myopia, bilateral: Secondary | ICD-10-CM | POA: Diagnosis not present

## 2021-08-10 DIAGNOSIS — H524 Presbyopia: Secondary | ICD-10-CM | POA: Diagnosis not present

## 2021-08-10 DIAGNOSIS — H52203 Unspecified astigmatism, bilateral: Secondary | ICD-10-CM | POA: Diagnosis not present

## 2021-08-10 DIAGNOSIS — Z961 Presence of intraocular lens: Secondary | ICD-10-CM | POA: Diagnosis not present

## 2021-08-10 DIAGNOSIS — E119 Type 2 diabetes mellitus without complications: Secondary | ICD-10-CM | POA: Diagnosis not present

## 2021-08-31 NOTE — Progress Notes (Deleted)
No chief complaint on file.    HISTORY OF PRESENT ILLNESS:  08/31/21 ALL:  Daniel [name] returns for follow up for AD. He continues Exelon patches. We discussed adding memantine, however, they were concerned about side effects.   03/07/2021 ALL:  Daniel [name] is a 86 y.o. male here today for follow up for AD. He continues Exelon patches 9.'5mg'$  and tolerating well. He presents with his wife who aids in history. She feels that he continues to have progressive cognitive decline. It is harder for him to retain information. He used to do the laundry but unable to learn how to use new washing machine. He has trouble with recall. He continues to perform ADLs independently. He is not as particular about his clothing. He does not drive. Daniel [name] assists with medications. He is able to walk steps in home. He uses a cane. Appetite is good but he prefers more sweets.    HISTORY (copied from Dr Garth Bigness previous note)  Daniel [name] is a 86 y.o. man with memory loss.   Update 03/04/2020: His wife notes that he is doing worse with short term memory.   He often forgets something he was told a few minutes earlier.    She also notes he needs to be reminded to change clothes.   He is on Exelon patches and he tolerates them well.   He has had a little more trouble with gait.    He is usibg a cane now and has been diagnosed with polyneuropathy.      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   He scored 25/30 on the MoCA today, better than last time 11/2018.  He lost all 5 points for delayed recall.   He has more numbness in his feet and now uses a cane all the time now to walk.  He has diabetes mellitus and labs have been stable.  He denies any new bladder/bowel issues (old frequency).     Vision is ok though he has ptosis due to old Bells OD.   He has a stable weight but seems to be eating worse.   He sleeps well most nights.  He does not take naps.   He and wife deny depression or anxiety.       Montreal Cognitive Assessment  03/04/2020 11/27/2018 11/21/2017 06/08/2016 03/09/2016  Visuospatial/ Executive (0/5) '5 4 5 5 5  '$ Naming (0/3) '3 3 3 3 3  '$ Attention: Read list of digits (0/2) '2 2 2 2 2  '$ Attention: Read list of letters (0/1) '1 1 1 1 1  '$ Attention: Serial 7 subtraction starting at 100 (0/3) '3 3 1 3 3  '$ Language: Repeat phrase (0/2) '2 2 2 2 2  '$ Language : Fluency (0/1) 1 0 '1 1 1  '$ Abstraction (0/2) '2 2 2 2 2  '$ Delayed Recall (0/5) 0 0 0 3 2  Orientation (0/6) '6 6 6 6 5  '$ Total '25 23 23 28 26  '$ Adjusted Score (based on education) 25 - '23 28 26     '$ Initial history:    Daniel [name] was working as a Engineer, 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   He saw Dr. Jannifer Franklin and had an evaluation with MMSE = 29.  The MRI brain was normal.   He did not worsen but still noted some difficulty at times.Marland Kitchen  He continued to work as an Optometrist.  A few years later he started to notice  that memory was becoming more of a problem again.  He sold hs practice in 2014 because he felt there was too high of a chance that he would make errors.Marland Kitchen   He feels memory issues mildly worsened slowly over time since then..  I have reviewed the CT scan from 06/27/2015 and compared it with the MRI from 09/05/2004. The CT scan shows moderate cortical atrophy, probably more pronounced in the mesial temporal lobes. There also appears to be some chronic microvascular ischemic change. The atrophy has progressed since the MRI from 2006. At his initial visit in early 2018, the MoCA score was 26/30. Follow-up 06/04/2016 showed improved scores of 28/30. However, he was experiencing a lot of diarrhea. Donepezil was switched to Exelon.  He has tolerated that   His great grandmother had dementia in her late 9's.  His mother had dementia that started in her 83's and died of a stroke, 2 aunts had severe dementia.   An uncle also had dementia (mild).  A second uncle had very mild dementia (late 89's) but died of unrelatred issues a year  later.   REVIEW OF SYSTEMS: Out of a complete 14 system review of symptoms, the patient complains only of the following symptoms, memory loss, frequent bowel movements, and all other reviewed systems are negative.   ALLERGIES: Allergies  Allergen Reactions   Donepezil Diarrhea   Tape Rash    PAPER TAPE IS PREFERRED, PLEASE     HOME MEDICATIONS: Outpatient Medications Prior to Visit  Medication Sig Dispense Refill   atorvastatin (LIPITOR) 40 MG tablet Take 40 mg by mouth daily at 6 (six) AM.      calcium-vitamin D (OSCAL WITH D) 500-200 MG-UNIT tablet Take 1 tablet by mouth daily with breakfast.     celecoxib (CELEBREX) 200 MG capsule Take 200 mg by mouth daily.  2   Cholecalciferol 50 MCG (2000 UT) CAPS Take 2,000 Units by mouth daily.      dicyclomine (BENTYL) 20 MG tablet TK 1 T PO TID PRN  6   empagliflozin (JARDIANCE) 10 MG TABS tablet Take 1 tablet by mouth daily.     ezetimibe (ZETIA) 10 MG tablet Take 1 tablet (10 mg total) by mouth every evening.  3   famotidine (PEPCID) 40 MG tablet Take 40 mg by mouth 3 (three) times daily.     Multiple Minerals-Vitamins (CALCIUM-MAGNESIUM-ZINC-D3) TABS Take 1 tablet by mouth daily.      naproxen sodium (ALEVE) 220 MG tablet Take 220 mg by mouth.     pregabalin (LYRICA) 75 MG capsule TK ONE C PO BID     ramipril (ALTACE) 2.5 MG capsule Take 2.5 mg by mouth daily.      rivastigmine (EXELON) 9.5 mg/24hr APPLY 1 PATCH(9.5 MG) TOPICALLY TO THE SKIN DAILY 90 patch 3   sitaGLIPtin (JANUVIA) 50 MG tablet Take 50 mg by mouth daily.     tamsulosin (FLOMAX) 0.4 MG CAPS capsule Take 0.4 mg by mouth at bedtime.     No facility-administered medications prior to visit.     PAST MEDICAL HISTORY: Past Medical History:  Diagnosis Date   Alzheimer disease ([place])    Arthritis    Cancer ([place])    Skin   Diabetes mellitus without complication ([place])    GERD (gastroesophageal reflux disease)    Hyperlipidemia    Hypertension      PAST SURGICAL  HISTORY: Past Surgical History:  Procedure Laterality Date   BACK SURGERY     BURR HOLE  Bilateral 03/09/2017   Procedure: BURR HOLES FOR SUBDURAL HEMATOMA BILATERAL;  Surgeon: Consuella Lose, MD;  Location: [facility];  Service: Neurosurgery;  Laterality: Bilateral;   CATARACT EXTRACTION Bilateral 1987 89   ROTATOR CUFF REPAIR Left 2003     FAMILY HISTORY: Family History  Problem Relation Age of Onset   Alzheimer's disease Mother    Alzheimer's disease Maternal Grandfather      SOCIAL HISTORY: Social History   Socioeconomic History   Marital status: Married    Spouse name: Not on file   Number of children: Not on file   Years of education: 20   Highest education level: Not on file  Occupational History   Not on file  Tobacco Use   Smoking status: Former    Types: Cigarettes    Quit date: 01/16/1978    Years since quitting: 43.6   Smokeless tobacco: Never  Vaping Use   Vaping Use: Never used  Substance and Sexual Activity   Alcohol use: Yes    Comment: occasionally   Drug use: No   Sexual activity: Not on file  Other Topics Concern   Not on file  Social History Narrative   Not on file   Social Determinants of Health   Financial Resource Strain: Not on file  Food Insecurity: Not on file  Transportation Needs: Not on file  Physical Activity: Not on file  Stress: Not on file  Social Connections: Not on file  Intimate Partner Violence: Not on file     PHYSICAL EXAM  There were no vitals filed for this visit.  There is no height or weight on file to calculate BMI.  Generalized: Well developed, in no acute distress  Cardiology: normal rate and rhythm, no murmur auscultated  Respiratory: clear to auscultation bilaterally    Neurological examination  Mentation: Alert oriented to time, place, history taking. Follows all commands speech and language fluent Cranial nerve II-XII: Pupils were equal round reactive to light. Extraocular movements were full, visual  field were full on confrontational test. Facial sensation and strength were normal. Uvula tongue midline. Head turning and shoulder shrug  were normal and symmetric. Motor: The motor testing reveals 5 over 5 strength of all 4 extremities. Good symmetric motor tone is noted throughout.  Sensory: Sensory testing is intact to soft touch on all 4 extremities. No evidence of extinction is noted.  Coordination: Cerebellar testing reveals good finger-nose-finger and heel-to-shin bilaterally.  Gait and station: Gait is normal. Tandem gait is normal. Romberg is negative. No drift is seen.  Reflexes: Deep tendon reflexes are symmetric and normal bilaterally.    DIAGNOSTIC DATA (LABS, IMAGING, TESTING) - I reviewed patient records, labs, notes, testing and imaging myself where available.  Lab Results  Component Value Date   WBC 11.2 (H) 03/26/2017   HGB 13.6 03/26/2017   HCT 42.2 03/26/2017   MCV 90.4 03/26/2017   PLT 334 03/26/2017      Component Value Date/Time   NA 136 03/26/2017 1204   K 4.1 03/26/2017 1204   CL 99 (L) 03/26/2017 1204   CO2 24 03/26/2017 1204   GLUCOSE 129 (H) 03/26/2017 1204   BUN 20 03/26/2017 1204   CREATININE 0.86 03/26/2017 1204   CALCIUM 10.2 03/26/2017 1204   PROT 6.8 03/04/2020 1548   ALBUMIN 3.3 (L) 03/14/2017 0621   AST 22 03/14/2017 0621   ALT 15 (L) 03/14/2017 0621   ALKPHOS 72 03/14/2017 0621   BILITOT 0.9 03/14/2017 0621   GFRNONAA >60  03/26/2017 1204   GFRAA >60 03/26/2017 1204   No results found for: "CHOL", "HDL", "LDLCALC", "LDLDIRECT", "TRIG", "CHOLHDL" Lab Results  Component Value Date   HGBA1C 6.9 (H) 09/06/2016   Lab Results  Component Value Date   VITAMINB12 480 03/04/2020   Lab Results  Component Value Date   TSH 1.910 03/09/2016        No data to display              03/07/2021    2:35 PM 03/04/2020    2:39 PM 11/27/2018    2:26 PM 11/21/2017    2:44 PM 06/08/2016    5:04 PM  Montreal Cognitive Assessment    Visuospatial/ Executive (0/5) '5 5 4 5 5  '$ Naming (0/3) '2 3 3 3 3  '$ Attention: Read list of digits (0/2) '2 2 2 2 2  '$ Attention: Read list of letters (0/1) '1 1 1 1 1  '$ Attention: Serial 7 subtraction starting at 100 (0/3) '3 3 3 1 3  '$ Language: Repeat phrase (0/2) '2 2 2 2 2  '$ Language : Fluency (0/1) 1 1 0 1 1  Abstraction (0/2) '2 2 2 2 2  '$ Delayed Recall (0/5) 0 0 0 0 3  Orientation (0/6) '2 6 6 6 6  '$ Total '20 25 23 23 28  '$ Adjusted Score (based on education)  '25  23 28     '$ ASSESSMENT AND PLAN  86 y.o. year old male  has a past medical history of Alzheimer disease ([place]), Arthritis, Cancer ([place]), Diabetes mellitus without complication ([place]), GERD (gastroesophageal reflux disease), Hyperlipidemia, and Hypertension. here with    No diagnosis found.  Mr Keena continues to note progressive memory loss. MOCA 20/30, last 25/30 in 02/2020. He lost all points in recall and 4 points in orientation. He appears to be tolerating Exelon patches. We will continue 9.'5mg'$  daily. We have discussed adding memantine, however, he has a very sensitive stomach with frequent bowel movements. I am unsure how well memantine would be tolerated. Educational material given for review. Mr and Daniel Monnier will discuss with care team. He was encouraged to continue physical and mental exercise every day. He was advised not to drive. Memory compensation strategies reviewed. I will have him follow up in 6 months, sooner if needed. He and his wife verbalize understanding and agreement with this plan.    No orders of the defined types were placed in this encounter.    No orders of the defined types were placed in this encounter.     Debbora Presto, MSN, FNP-C 08/31/2021, 4:33 PM  [facility] [address], [place], [place] [address] [phone]

## 2021-09-05 ENCOUNTER — Ambulatory Visit: Payer: PPO | Admitting: Family Medicine

## 2021-09-05 ENCOUNTER — Telehealth: Payer: Self-pay | Admitting: Family Medicine

## 2021-09-05 DIAGNOSIS — F0280 Dementia in other diseases classified elsewhere without behavioral disturbance: Secondary | ICD-10-CM

## 2021-09-05 NOTE — Telephone Encounter (Signed)
Pt's wife,[name] [name] cancelled appt due to not feeling well.

## 2021-09-27 DIAGNOSIS — G309 Alzheimer's disease, unspecified: Secondary | ICD-10-CM | POA: Diagnosis not present

## 2021-09-27 DIAGNOSIS — Z862 Personal history of diseases of the blood and blood-forming organs and certain disorders involving the immune mechanism: Secondary | ICD-10-CM | POA: Diagnosis not present

## 2021-11-02 DIAGNOSIS — E119 Type 2 diabetes mellitus without complications: Secondary | ICD-10-CM | POA: Diagnosis not present

## 2021-11-02 DIAGNOSIS — E785 Hyperlipidemia, unspecified: Secondary | ICD-10-CM | POA: Diagnosis not present

## 2021-11-09 DIAGNOSIS — N401 Enlarged prostate with lower urinary tract symptoms: Secondary | ICD-10-CM | POA: Diagnosis not present

## 2021-11-09 DIAGNOSIS — K219 Gastro-esophageal reflux disease without esophagitis: Secondary | ICD-10-CM | POA: Diagnosis not present

## 2021-11-09 DIAGNOSIS — E1149 Type 2 diabetes mellitus with other diabetic neurological complication: Secondary | ICD-10-CM | POA: Diagnosis not present

## 2021-11-09 DIAGNOSIS — Z23 Encounter for immunization: Secondary | ICD-10-CM | POA: Diagnosis not present

## 2021-11-09 DIAGNOSIS — I10 Essential (primary) hypertension: Secondary | ICD-10-CM | POA: Diagnosis not present

## 2021-11-09 DIAGNOSIS — G301 Alzheimer's disease with late onset: Secondary | ICD-10-CM | POA: Diagnosis not present

## 2021-11-09 DIAGNOSIS — F02B0 Dementia in other diseases classified elsewhere, moderate, without behavioral disturbance, psychotic disturbance, mood disturbance, and anxiety: Secondary | ICD-10-CM | POA: Diagnosis not present

## 2021-11-09 DIAGNOSIS — Z0000 Encounter for general adult medical examination without abnormal findings: Secondary | ICD-10-CM | POA: Diagnosis not present

## 2021-11-09 DIAGNOSIS — G629 Polyneuropathy, unspecified: Secondary | ICD-10-CM | POA: Diagnosis not present

## 2021-12-26 ENCOUNTER — Other Ambulatory Visit: Payer: Self-pay | Admitting: Neurological Surgery

## 2021-12-26 DIAGNOSIS — I6203 Nontraumatic chronic subdural hemorrhage: Secondary | ICD-10-CM

## 2022-01-10 ENCOUNTER — Ambulatory Visit: Payer: PPO | Admitting: Family Medicine

## 2022-01-17 ENCOUNTER — Encounter: Payer: Self-pay | Admitting: Family Medicine

## 2022-01-17 ENCOUNTER — Ambulatory Visit: Payer: PPO | Admitting: Family Medicine

## 2022-01-17 VITALS — BP 122/60 | HR 72 | Ht 73.0 in | Wt 164.5 lb

## 2022-01-17 DIAGNOSIS — Z8679 Personal history of other diseases of the circulatory system: Secondary | ICD-10-CM

## 2022-01-17 DIAGNOSIS — G629 Polyneuropathy, unspecified: Secondary | ICD-10-CM | POA: Diagnosis not present

## 2022-01-17 DIAGNOSIS — G301 Alzheimer's disease with late onset: Secondary | ICD-10-CM

## 2022-01-17 DIAGNOSIS — F0280 Dementia in other diseases classified elsewhere without behavioral disturbance: Secondary | ICD-10-CM | POA: Diagnosis not present

## 2022-01-17 MED ORDER — RIVASTIGMINE 9.5 MG/24HR TD PT24: MEDICATED_PATCH | TRANSDERMAL | 3 refills | Status: DC

## 2022-01-17 NOTE — Progress Notes (Signed)
Chief Complaint  Patient presents with   Follow-up    RM 2 with wife. Ambulates with cane.  Last seen 03/07/21. Last MOCA 20/30. [place] 23/30.      HISTORY OF PRESENT ILLNESS:  01/17/22 ALL: Daniel [name] returns for follow up for AD. He was last seen 02/2021 and noted some worsening. MMSE 20/30. We continued Exelon 9.'5mg'$  daily. Memantine declined due to history if irritable bowel. Since, he reports doing well. His wife feels that his memory seems to be fairly stable. She does feel that he has more difficulty with comprehension at times. He continues to read everyday. He is sleeping well. Usually goes to bed between 12a-2a and wakes between 11a-12p. He is not eating as much as he used to. Weight is stable. He is not as active. He is using a cane for stability. He feels neuropathy contributes. No falls. He is able to bathe independently but forgets he needs to. He manages his medicaitons with oversight from his wife. He does not drive. He lives at [facility].   He continues close follow up with Dr Ellene Route. CT scan stable at last follow up 6 months ago. He has repeat scan planned for next month.   03/07/2021 ALL:  Daniel [name] is a 87 y.o. male here today for follow up for AD. He continues Exelon patches 9.'5mg'$  and tolerating well. He presents with his wife who aids in history. She feels that he continues to have progressive cognitive decline. It is harder for him to retain information. He used to do the laundry but unable to learn how to use new washing machine. He has trouble with recall. He continues to perform ADLs independently. He is not as particular about his clothing. He does not drive. Mrs Boys assists with medications. He is able to walk steps in home. He uses a cane. Appetite is good but he prefers more sweets.    HISTORY (copied from Dr Garth Bigness previous note)  Daniel Diss. [name] is a 87 y.o. man with memory loss.   Update 03/04/2020: His wife notes that he is doing worse with  short term memory.   He often forgets something he was told a few minutes earlier.    She also notes he needs to be reminded to change clothes.   He is on Exelon patches and he tolerates them well.   He has had a little more trouble with gait.    He is usibg a cane now and has been diagnosed with polyneuropathy.      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   He scored 25/30 on the MoCA today, better than last time 11/2018.  He lost all 5 points for delayed recall.   He has more numbness in his feet and now uses a cane all the time now to walk.  He has diabetes mellitus and labs have been stable.  He denies any new bladder/bowel issues (old frequency).     Vision is ok though he has ptosis due to old Bells OD.   He has a stable weight but seems to be eating worse.   He sleeps well most nights.  He does not take naps.   He and wife deny depression or anxiety.      Montreal Cognitive Assessment  03/04/2020 11/27/2018 11/21/2017 06/08/2016 03/09/2016  Visuospatial/ Executive (0/5) '5 4 5 5 5  '$ Naming (0/3) '3 3 3 3 3  '$ Attention: Read list of digits (0/2)  $'2 2 2 2 2  'r$ Attention: Read list of letters (0/1) '1 1 1 1 1  '$ Attention: Serial 7 subtraction starting at 100 (0/3) '3 3 1 3 3  '$ Language: Repeat phrase (0/2) '2 2 2 2 2  '$ Language : Fluency (0/1) 1 0 '1 1 1  '$ Abstraction (0/2) '2 2 2 2 2  '$ Delayed Recall (0/5) 0 0 0 3 2  Orientation (0/6) '6 6 6 6 5  '$ Total '25 23 23 28 26  '$ Adjusted Score (based on education) 25 - '23 28 26     '$ Initial history:    Daniel [name] was working as a Engineer, 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   He saw Dr. Jannifer Franklin and had an evaluation with MMSE = 29.  The MRI brain was normal.   He did not worsen but still noted some difficulty at times.Marland Kitchen  He continued to work as an Optometrist.  A few years later he started to notice that memory was becoming more of a problem again.  He sold hs practice in 2014 because he felt there was too high of a chance that he would make errors.Marland Kitchen    He feels memory issues mildly worsened slowly over time since then..  I have reviewed the CT scan from 06/27/2015 and compared it with the MRI from 09/05/2004. The CT scan shows moderate cortical atrophy, probably more pronounced in the mesial temporal lobes. There also appears to be some chronic microvascular ischemic change. The atrophy has progressed since the MRI from 2006. At his initial visit in early 2018, the MoCA score was 26/30. Follow-up 06/04/2016 showed improved scores of 28/30. However, he was experiencing a lot of diarrhea. Donepezil was switched to Exelon.  He has tolerated that   His great grandmother had dementia in her late 13's.  His mother had dementia that started in her 27's and died of a stroke, 2 aunts had severe dementia.   An uncle also had dementia (mild).  A second uncle had very mild dementia (late 56's) but died of unrelatred issues a year later.   REVIEW OF SYSTEMS: Out of a complete 14 system review of symptoms, the patient complains only of the following symptoms, memory loss, frequent bowel movements, and all other reviewed systems are negative.   ALLERGIES: Allergies  Allergen Reactions   Donepezil Diarrhea   Tape Rash    PAPER TAPE IS PREFERRED, PLEASE     HOME MEDICATIONS: Outpatient Medications Prior to Visit  Medication Sig Dispense Refill   atorvastatin (LIPITOR) 40 MG tablet Take 40 mg by mouth daily at 6 (six) AM.      calcium-vitamin D (OSCAL WITH D) 500-200 MG-UNIT tablet Take 1 tablet by mouth daily with breakfast.     celecoxib (CELEBREX) 200 MG capsule Take 200 mg by mouth daily.  2   Cholecalciferol 50 MCG (2000 UT) CAPS Take 2,000 Units by mouth daily.      dicyclomine (BENTYL) 20 MG tablet TK 1 T PO TID PRN  6   empagliflozin (JARDIANCE) 10 MG TABS tablet Take 1 tablet by mouth daily.     ezetimibe (ZETIA) 10 MG tablet Take 1 tablet (10 mg total) by mouth every evening.  3   famotidine (PEPCID) 40 MG tablet Take 40 mg by mouth 3 (three)  times daily.     Multiple Minerals-Vitamins (CALCIUM-MAGNESIUM-ZINC-D3) TABS Take 1 tablet by mouth daily.      naproxen sodium (ALEVE) 220 MG tablet Take 220 mg by  mouth.     sitaGLIPtin (JANUVIA) 50 MG tablet Take 50 mg by mouth daily.     tamsulosin (FLOMAX) 0.4 MG CAPS capsule Take 0.4 mg by mouth at bedtime.     rivastigmine (EXELON) 9.5 mg/24hr APPLY 1 PATCH(9.5 MG) TOPICALLY TO THE SKIN DAILY 90 patch 3   pregabalin (LYRICA) 75 MG capsule TK ONE C PO BID     ramipril (ALTACE) 2.5 MG capsule Take 2.5 mg by mouth daily.      No facility-administered medications prior to visit.     PAST MEDICAL HISTORY: Past Medical History:  Diagnosis Date   Alzheimer disease ([place])    Arthritis    Cancer ([place])    Skin   Diabetes mellitus without complication ([place])    GERD (gastroesophageal reflux disease)    Hyperlipidemia    Hypertension      PAST SURGICAL HISTORY: Past Surgical History:  Procedure Laterality Date   BACK SURGERY     BURR HOLE Bilateral 03/09/2017   Procedure: BURR HOLES FOR SUBDURAL HEMATOMA BILATERAL;  Surgeon: Consuella Lose, MD;  Location: [facility];  Service: Neurosurgery;  Laterality: Bilateral;   CATARACT EXTRACTION Bilateral 1987 89   ROTATOR CUFF REPAIR Left 2003     FAMILY HISTORY: Family History  Problem Relation Age of Onset   Alzheimer's disease Mother    Alzheimer's disease Maternal Grandfather      SOCIAL HISTORY: Social History   Socioeconomic History   Marital status: Married    Spouse name: Not on file   Number of children: Not on file   Years of education: 20   Highest education level: Not on file  Occupational History   Not on file  Tobacco Use   Smoking status: Former    Types: Cigarettes    Quit date: 01/16/1978    Years since quitting: 44.0   Smokeless tobacco: Never  Vaping Use   Vaping Use: Never used  Substance and Sexual Activity   Alcohol use: Yes    Comment: occasionally   Drug use: No   Sexual activity: Not on file   Other Topics Concern   Not on file  Social History Narrative   Not on file   Social Determinants of Health   Financial Resource Strain: Not on file  Food Insecurity: Not on file  Transportation Needs: Not on file  Physical Activity: Not on file  Stress: Not on file  Social Connections: Not on file  Intimate Partner Violence: Not on file     PHYSICAL EXAM  Vitals:   01/17/22 1449  BP: 122/60  Pulse: 72  Weight: 164 lb 8 oz (74.6 kg)  Height: '6\' 1"'$  (1.854 m)    Body mass index is 21.7 kg/m.  Generalized: Well developed, in no acute distress  Cardiology: normal rate and rhythm, no murmur auscultated  Respiratory: clear to auscultation bilaterally    Neurological examination  Mentation: Alert oriented to time, place, history taking. Follows all commands speech and language fluent Cranial nerve II-XII: Pupils were equal round reactive to light. Extraocular movements were full, visual field were full on confrontational test. Facial sensation and strength were normal. Head turning and shoulder shrug  were normal and symmetric. Motor: The motor testing reveals 5 over 5 strength of all 4 extremities. Good symmetric motor tone is noted throughout.  Sensory: Sensory testing is intact to soft touch on all 4 extremities. No evidence of extinction is noted.  Gait and station: Gait is slightly short, stable with cane. Tandem  not tested.  Reflexes: Deep tendon reflexes are symmetric and normal bilaterally.    DIAGNOSTIC DATA (LABS, IMAGING, TESTING) - I reviewed patient records, labs, notes, testing and imaging myself where available.  Lab Results  Component Value Date   WBC 11.2 (H) 03/26/2017   HGB 13.6 03/26/2017   HCT 42.2 03/26/2017   MCV 90.4 03/26/2017   PLT 334 03/26/2017      Component Value Date/Time   NA 136 03/26/2017 1204   K 4.1 03/26/2017 1204   CL 99 (L) 03/26/2017 1204   CO2 24 03/26/2017 1204   GLUCOSE 129 (H) 03/26/2017 1204   BUN 20 03/26/2017 1204    CREATININE 0.86 03/26/2017 1204   CALCIUM 10.2 03/26/2017 1204   PROT 6.8 03/04/2020 1548   ALBUMIN 3.3 (L) 03/14/2017 0621   AST 22 03/14/2017 0621   ALT 15 (L) 03/14/2017 0621   ALKPHOS 72 03/14/2017 0621   BILITOT 0.9 03/14/2017 0621   GFRNONAA >60 03/26/2017 1204   GFRAA >60 03/26/2017 1204   No results found for: "CHOL", "HDL", "LDLCALC", "LDLDIRECT", "TRIG", "CHOLHDL" Lab Results  Component Value Date   HGBA1C 6.9 (H) 09/06/2016   Lab Results  Component Value Date   VITAMINB12 480 03/04/2020   Lab Results  Component Value Date   TSH 1.910 03/09/2016        No data to display              01/17/2022    2:50 PM 03/07/2021    2:35 PM 03/04/2020    2:39 PM 11/27/2018    2:26 PM 11/21/2017    2:44 PM  Montreal Cognitive Assessment   Visuospatial/ Executive (0/5) '5 5 5 4 5  '$ Naming (0/3) '3 2 3 3 3  '$ Attention: Read list of digits (0/2) '2 2 2 2 2  '$ Attention: Read list of letters (0/1) '1 1 1 1 1  '$ Attention: Serial 7 subtraction starting at 100 (0/3) '3 3 3 3 1  '$ Language: Repeat phrase (0/2) '2 2 2 2 2  '$ Language : Fluency (0/1) 0 1 1 0 1  Abstraction (0/2) '2 2 2 2 2  '$ Delayed Recall (0/5) 0 0 0 0 0  Orientation (0/6) '5 2 6 6 6  '$ Total '23 20 25 23 23  '$ Adjusted Score (based on education)   25  23     ASSESSMENT AND PLAN  87 y.o. year old male  has a past medical history of Alzheimer disease ([place]), Arthritis, Cancer ([place]), Diabetes mellitus without complication ([place]), GERD (gastroesophageal reflux disease), Hyperlipidemia, and Hypertension. here with    Late onset Alzheimer's disease without behavioral disturbance ([place])  History of subdural hematoma  Polyneuropathy  Daniel [name] feels they memory is failry stable. [place] 23/30, last 20/30 in 02/2021. He appears to be tolerating Exelon patches. We will continue 9.'5mg'$  daily. We have discussed adding memantine, however, he has a very sensitive stomach with frequent bowel movements. He and his wife decline. He was  encouraged to continue physical and mental exercise every day. He was advised not to drive. Memory compensation strategies reviewed. I will have him follow up in 1 year, sooner if needed. He and his wife verbalize understanding and agreement with this plan.    No orders of the defined types were placed in this encounter.    Meds ordered this encounter  Medications   rivastigmine (EXELON) 9.5 mg/24hr    Sig: APPLY 1 PATCH(9.5 MG) TOPICALLY TO THE SKIN DAILY    Dispense:  90 patch    Refill:  3    Order Specific Question:   Supervising Provider    Answer:   Melvenia Beam [[id number]]      Debbora Presto, MSN, FNP-C 01/17/2022, 4:11 PM  [facility] [address] [place], [place] [address] [phone]

## 2022-01-17 NOTE — Patient Instructions (Signed)
Below is our plan:  We will continue Exelon patch daily.   Please make sure you are staying well hydrated. I recommend 50-60 ounces daily. Well balanced diet and regular exercise encouraged. Consistent sleep schedule with 6-8 hours recommended.   Please continue follow up with care team as directed.   Follow up with me in 1 year   You may receive a survey regarding today's visit. I encourage you to leave honest feed back as I do use this information to improve patient care. Thank you for seeing me today!   Management of Memory Problems   There are some general things you can do to help manage your memory problems.  Your memory may not in fact recover, but by using techniques and strategies you will be able to manage your memory difficulties better.   1)  Establish a routine. Try to establish and then stick to a regular routine.  By doing this, you will get used to what to expect and you will reduce the need to rely on your memory.  Also, try to do things at the same time of day, such as taking your medication or checking your calendar first thing in the morning. Think about think that you can do as a part of a regular routine and make a list.  Then enter them into a daily planner to remind you.  This will help you establish a routine.   2)  Organize your environment. Organize your environment so that it is uncluttered.  Decrease visual stimulation.  Place everyday items such as keys or cell phone in the same place every day (ie.  Basket next to front door) Use post it notes with a brief message to yourself (ie. Turn off light, lock the door) Use labels to indicate where things go (ie. Which cupboards are for food, dishes, etc.) Keep a notepad and pen by the telephone to take messages   3)  Memory Aids A diary or journal/notebook/daily planner Making a list (shopping list, chore list, to do list that needs to be done) Using an alarm as a reminder (kitchen timer or cell phone alarm) Using  cell phone to store information (Notes, Calendar, Reminders) Calendar/White board placed in a prominent position Post-it notes   In order for memory aids to be useful, you need to have good habits.  It's no good remembering to make a note in your journal if you don't remember to look in it.  Try setting aside a certain time of day to look in journal.   4)  Improving mood and managing fatigue. There may be other factors that contribute to memory difficulties.  Factors, such as anxiety, depression and tiredness can affect memory. Regular gentle exercise can help improve your mood and give you more energy. Exercise: there are short videos created by the [employer] on Health specially for older adults: https://bit.ly/2I30q97.  Mediterranean diet: which emphasizes fruits, vegetables, whole grains, legumes, fish, and other seafood; unsaturated fats such as olive oils; and low amounts of red meat, eggs, and sweets. A variation of this, called MIND ([place] Intervention for Neurodegenerative Delay) incorporates the DASH (Dietary Approaches to Stop Hypertension) diet, which has been shown to lower high blood pressure, a risk factor for Alzheimer's disease. More information at: RepublicForum.gl.  Aerobic exercise that improve heart health is also good for the mind.  [employer] on Aging have short videos for exercises that you can do at home: GoldCloset.com.ee Simple relaxation techniques may help relieve symptoms of  anxiety Try to get back to completing activities or hobbies you enjoyed doing in the past. Learn to pace yourself through activities to decrease fatigue. Find out about some local support groups where you can share experiences with others. Try and achieve 7-8 hours of sleep at night.   Resources for Family/Caregiver  Online caregiver support groups can be found at LDLive.be or call  Alzheimer's Association's 24/7 hotline: 757-066-5008. [facility] offers in-person, virtual support groups and individual counseling for both care partners and persons with memory loss. Call for more information at [phone].   Advanced care plan: there are two types of Power of Attorney: healthcare and durable. Healthcare POA is a designated person to make healthcare decisions on your behalf if you were too sick to make them yourself. This person can be selected and documented by your physician. Durable POA has to be set up with a lawyer who takes charge of your finances and estate if you were too sick or cognitively impaired to manage your finances accurately. You can find a local Elder Engineer, mining here: ToyShower.it.  Check out www.planyourlifespan.org, which will help you plan before a crisis and decide who will take care of life considerations in a circumstance where you may not be able to speak for yourself.   Helpful books (available on [employer] or your local bookstore):  By Dr. Army Chaco: Keeping Love Alive as Memories Fade: The 5 Love Languages and the Alzheimer's Journey Oct 17, 2014 The Dementia Care Partner's Workbook: A Guide for Understanding, Education, and [employer] - June 16, 2017.  Both available for less than $15.   "Coping with behavior change in dementia: a family caregiver's guide" by [place] "A Caregiver's Guide to Dementia: Using Activities and Other Strategies to Prevent, Reduce and Manage Behavioral Symptoms" by Osie Bond Gitlin and [employer].  Arts development officer of Joy for the Person with Alzheimer's or Dementia" 4th edition by Vance Peper  Caregiver videos on common behaviors related to dementia: quierodirigir.com  [facility] Caregiver Portal: free to sign up, links to local resources: [email or web address]

## 2022-01-20 ENCOUNTER — Ambulatory Visit
Admission: RE | Admit: 2022-01-20 | Discharge: 2022-01-20 | Disposition: A | Discharge status: Home / Self Care | Payer: PPO | Source: Ambulatory Visit | Attending: Neurological Surgery | Admitting: Neurological Surgery

## 2022-01-20 DIAGNOSIS — I672 Cerebral atherosclerosis: Secondary | ICD-10-CM | POA: Diagnosis not present

## 2022-01-20 DIAGNOSIS — I6203 Nontraumatic chronic subdural hemorrhage: Secondary | ICD-10-CM

## 2022-01-20 DIAGNOSIS — I6200 Nontraumatic subdural hemorrhage, unspecified: Secondary | ICD-10-CM | POA: Diagnosis not present

## 2022-02-03 ENCOUNTER — Other Ambulatory Visit: Payer: PPO

## 2022-02-15 DIAGNOSIS — I6203 Nontraumatic chronic subdural hemorrhage: Secondary | ICD-10-CM | POA: Diagnosis not present

## 2022-08-31 ENCOUNTER — Encounter: Payer: Self-pay | Admitting: Family Medicine

## 2022-11-22 DIAGNOSIS — E119 Type 2 diabetes mellitus without complications: Secondary | ICD-10-CM | POA: Diagnosis not present

## 2022-11-22 DIAGNOSIS — E785 Hyperlipidemia, unspecified: Secondary | ICD-10-CM | POA: Diagnosis not present

## 2022-11-29 DIAGNOSIS — G309 Alzheimer's disease, unspecified: Secondary | ICD-10-CM | POA: Diagnosis not present

## 2022-11-29 DIAGNOSIS — N401 Enlarged prostate with lower urinary tract symptoms: Secondary | ICD-10-CM | POA: Diagnosis not present

## 2022-11-29 DIAGNOSIS — E1149 Type 2 diabetes mellitus with other diabetic neurological complication: Secondary | ICD-10-CM | POA: Diagnosis not present

## 2022-11-29 DIAGNOSIS — K219 Gastro-esophageal reflux disease without esophagitis: Secondary | ICD-10-CM | POA: Diagnosis not present

## 2022-11-29 DIAGNOSIS — I10 Essential (primary) hypertension: Secondary | ICD-10-CM | POA: Diagnosis not present

## 2022-11-29 DIAGNOSIS — Z0000 Encounter for general adult medical examination without abnormal findings: Secondary | ICD-10-CM | POA: Diagnosis not present

## 2023-01-18 ENCOUNTER — Ambulatory Visit: Payer: PPO | Admitting: Family Medicine

## 2023-03-19 ENCOUNTER — Telehealth: Payer: Self-pay | Admitting: Family Medicine

## 2023-03-19 ENCOUNTER — Encounter: Payer: Self-pay | Admitting: Family Medicine

## 2023-03-19 NOTE — Telephone Encounter (Signed)
 LVM and sent letter in mail informing pt of need to reschedule 05/14/23 appt - NP out

## 2023-04-02 ENCOUNTER — Other Ambulatory Visit: Payer: Self-pay

## 2023-04-02 MED ORDER — RIVASTIGMINE 9.5 MG/24HR TD PT24: MEDICATED_PATCH | TRANSDERMAL | 3 refills | Status: DC

## 2023-05-14 ENCOUNTER — Ambulatory Visit: Payer: PPO | Admitting: Family Medicine

## 2023-05-29 DIAGNOSIS — E1149 Type 2 diabetes mellitus with other diabetic neurological complication: Secondary | ICD-10-CM | POA: Diagnosis not present

## 2023-05-29 DIAGNOSIS — I10 Essential (primary) hypertension: Secondary | ICD-10-CM | POA: Diagnosis not present

## 2023-05-29 DIAGNOSIS — G301 Alzheimer's disease with late onset: Secondary | ICD-10-CM | POA: Diagnosis not present

## 2023-06-28 NOTE — Progress Notes (Unsigned)
 Chief Complaint  Patient presents with   Memory Loss    Rm 8 with spouse  Pt is well, spouse reports his memory has declined since last visit. Pts PCP has started him on Namenda 5mg , tolerating well.      HISTORY OF PRESENT ILLNESS:  07/02/23 ALL: Daniel [name] returns for follow up for dementia. He was last seen by me 01/2022 and doing well on Exelon  9.5mg  patch daily. MOCA 23/30. Since, he reports doing fairly well. Mrs Denio has noted some decline in short term memory. They lost their son last year, unexpectedly. They have been less active, since. Dr Schuyler Custard added memantine 5mg  daily for two weeks then up to BID if tolerated well. Appetite is good. Weight is stable. He does nap more. Gait is more shuffled. He uses cane. He fell out of his chair fall 2024. EMS had to come help get him up. Fortunately, no injuries. Neuropathy is stable. HR usually on lower side. He denies dizziness. He remains independent with dressing and bathing. Mrs Ronan prepares meals and helps with medication administration. They have an assistant that helps with cleaning needed and grocery runs. He has not seen Dr Ellery Guthrie since 2024. Mrs Alessio continues to drive without difficulty. Daniel [name] does not drive. They reside at [facility]   01/17/2022 ALL:  Daniel [name] returns for follow up for AD. He was last seen 02/2021 and noted some worsening. MMSE 20/30. We continued Exelon  9.5mg  daily. Memantine declined due to history if irritable bowel. Since, he reports doing well. His wife feels that his memory seems to be fairly stable. She does feel that he has more difficulty with comprehension at times. He continues to read everyday. He is sleeping well. Usually goes to bed between 12a-2a and wakes between 11a-12p. He is not eating as much as he used to. Weight is stable. He is not as active. He is using a cane for stability. He feels neuropathy contributes. No falls. He is able to bathe independently but forgets he needs to. He  manages his medicaitons with oversight from his wife. He does not drive. He lives at [facility].   He continues close follow up with Dr Ellery Guthrie. CT scan stable at last follow up 6 months ago. He has repeat scan planned for next month.   03/07/2021 ALL:  Daniel [name] is a 88 y.o. male here today for follow up for AD. He continues Exelon  patches 9.5mg  and tolerating well. He presents with his wife who aids in history. She feels that he continues to have progressive cognitive decline. It is harder for him to retain information. He used to do the laundry but unable to learn how to use new washing machine. He has trouble with recall. He continues to perform ADLs independently. He is not as particular about his clothing. He does not drive. Mrs Fluharty assists with medications. He is able to walk steps in home. He uses a cane. Appetite is good but he prefers more sweets.   HISTORY (copied from Dr Thom Fleeting previous note)  Pleas Brill. Arth is a 88 y.o. man with memory loss.   Update 03/04/2020: His wife notes that he is doing worse with short term memory.   He often forgets something he was told a few minutes earlier.    She also notes he needs to be reminded to change clothes.   He is on Exelon  patches and he tolerates them well.   He has had a little more trouble  with gait.    He is usibg a cane now and has been diagnosed with polyneuropathy.      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   He scored 25/30 on the MoCA today, better than last time 11/2018.  He lost all 5 points for delayed recall.   He has more numbness in his feet and now uses a cane all the time now to walk.  He has diabetes mellitus and labs have been stable.  He denies any new bladder/bowel issues (old frequency).     Vision is ok though he has ptosis due to old Bells OD.   He has a stable weight but seems to be eating worse.   He sleeps well most nights.  He does not take naps.   He and wife deny depression or anxiety.       Montreal Cognitive Assessment  03/04/2020 11/27/2018 11/21/2017 06/08/2016 03/09/2016  Visuospatial/ Executive (0/5) 5 4 5 5 5   Naming (0/3) 3 3 3 3 3   Attention: Read list of digits (0/2) 2 2 2 2 2   Attention: Read list of letters (0/1) 1 1 1 1 1   Attention: Serial 7 subtraction starting at 100 (0/3) 3 3 1 3 3   Language: Repeat phrase (0/2) 2 2 2 2 2   Language : Fluency (0/1) 1 0 1 1 1   Abstraction (0/2) 2 2 2 2 2   Delayed Recall (0/5) 0 0 0 3 2  Orientation (0/6) 6 6 6 6 5   Total 25 23 23 28 26   Adjusted Score (based on education) 25 - 23 28 26      Initial history:    Daniel [name] was working as a IT trainer when he noted some mild memory difficulty 12 years ago.   He saw Dr. Tilda Fogo and had an evaluation with MMSE = 29.  The MRI brain was normal.   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  A few years later he started to notice that memory was becoming more of a problem again.  He sold hs practice in 2014 because he felt there was too high of a chance that he would make errors.Daniel [name]   He feels memory issues mildly worsened slowly over time since then..  I have reviewed the CT scan from 06/27/2015 and compared it with the MRI from 09/05/2004. The CT scan shows moderate cortical atrophy, probably more pronounced in the mesial temporal lobes. There also appears to be some chronic microvascular ischemic change. The atrophy has progressed since the MRI from 2006. At his initial visit in early 2018, the MoCA score was 26/30. Follow-up 06/04/2016 showed improved scores of 28/30. However, he was experiencing a lot of diarrhea. Donepezil  was switched to Exelon .  He has tolerated that   His great grandmother had dementia in her late 8's.  His mother had dementia that started in her 73's and died of a stroke, 2 aunts had severe dementia.   An uncle also had dementia (mild).  A second uncle had very mild dementia (late 43's) but died of unrelatred issues a year  later.   REVIEW OF SYSTEMS: Out of a complete 14 system review of symptoms, the patient complains only of the following symptoms, memory loss, frequent bowel movements, and all other reviewed systems are negative.   ALLERGIES: Allergies  Allergen Reactions   Donepezil  Diarrhea   Tape Rash    PAPER TAPE IS  PREFERRED, PLEASE     HOME MEDICATIONS: Outpatient Medications Prior to Visit  Medication Sig Dispense Refill   atorvastatin  (LIPITOR) 40 MG tablet Take 40 mg by mouth daily at 6 (six) AM.      calcium -vitamin D  (OSCAL WITH D) 500-200 MG-UNIT tablet Take 1 tablet by mouth daily with breakfast.     celecoxib (CELEBREX) 200 MG capsule Take 200 mg by mouth daily.  2   Cholecalciferol  50 MCG (2000 UT) CAPS Take 2,000 Units by mouth daily.      empagliflozin (JARDIANCE) 10 MG TABS tablet Take 1 tablet by mouth daily.     memantine (NAMENDA) 5 MG tablet Take 5 mg by mouth daily.     Multiple Minerals-Vitamins (CALCIUM -MAGNESIUM -ZINC -D3) TABS Take 1 tablet by mouth daily.      sitaGLIPtin (JANUVIA) 50 MG tablet Take 50 mg by mouth daily.     tamsulosin  (FLOMAX ) 0.4 MG CAPS capsule Take 0.4 mg by mouth at bedtime.     dicyclomine (BENTYL) 20 MG tablet TK 1 T PO TID PRN (Patient not taking: Reported on 06/29/2023)  6   ezetimibe  (ZETIA ) 10 MG tablet Take 1 tablet (10 mg total) by mouth every evening. (Patient not taking: Reported on 06/29/2023)  3   famotidine (PEPCID) 40 MG tablet Take 40 mg by mouth 3 (three) times daily. (Patient not taking: Reported on 06/29/2023)     naproxen sodium (ALEVE) 220 MG tablet Take 220 mg by mouth. (Patient not taking: Reported on 06/29/2023)     rivastigmine  (EXELON ) 9.5 mg/24hr APPLY 1 PATCH(9.5 MG) TOPICALLY TO THE SKIN DAILY 90 patch 3   No facility-administered medications prior to visit.     PAST MEDICAL HISTORY: Past Medical History:  Diagnosis Date   Alzheimer disease (HCC)    Arthritis    Cancer (HCC)    Skin   Diabetes mellitus without  complication (HCC)    GERD (gastroesophageal reflux disease)    Hyperlipidemia    Hypertension      PAST SURGICAL HISTORY: Past Surgical History:  Procedure Laterality Date   BACK SURGERY     BURR HOLE Bilateral 03/09/2017   Procedure: BURR HOLES FOR SUBDURAL HEMATOMA BILATERAL;  Surgeon: Augusto Blonder, MD;  Location: [facility] OR;  Service: Neurosurgery;  Laterality: Bilateral;   CATARACT EXTRACTION Bilateral 1987 89   ROTATOR CUFF REPAIR Left 2003     FAMILY HISTORY: Family History  Problem Relation Age of Onset   Alzheimer's disease Mother    Alzheimer's disease Maternal Grandfather      SOCIAL HISTORY: Social History   Socioeconomic History   Marital status: Married    Spouse name: Not on file   Number of children: Not on file   Years of education: 20   Highest education level: Not on file  Occupational History   Not on file  Tobacco Use   Smoking status: Former    Current packs/day: 0.00    Types: Cigarettes    Quit date: 01/16/1978    Years since quitting: 45.4   Smokeless tobacco: Never  Vaping Use   Vaping status: Never Used  Substance and Sexual Activity   Alcohol use: Yes    Comment: occasionally   Drug use: No   Sexual activity: Not on file  Other Topics Concern   Not on file  Social History Narrative   Not on file   Social Drivers of Health   Financial Resource Strain: Not on file  Food Insecurity: Not on file  Transportation Needs:  Not on file  Physical Activity: Not on file  Stress: Not on file  Social Connections: Not on file  Intimate Partner Violence: Not on file     PHYSICAL EXAM  Vitals:   06/29/23 1045  BP: 129/72  Pulse: (!) 51  Weight: 163 lb (73.9 kg)  Height: 6' (1.829 m)     Body mass index is 22.11 kg/m.  Generalized: Well developed, in no acute distress  Cardiology: normal rate and rhythm, no murmur auscultated  Respiratory: clear to auscultation bilaterally    Neurological examination  Mentation: Alert  oriented to time, place, history taking. Follows all commands speech and language fluent Cranial nerve II-XII: Pupils were equal round reactive to light. Extraocular movements were full, visual field were full on confrontational test. Facial sensation and strength were normal. Head turning and shoulder shrug  were normal and symmetric. Motor: The motor testing reveals 5 over 5 strength of all 4 extremities. Good symmetric motor tone is noted throughout.  Sensory: Sensory testing is intact to soft touch on all 4 extremities. No evidence of extinction is noted.  Gait and station: Gait is slightly short, stable with cane. Tandem not tested.  Reflexes: Deep tendon reflexes are symmetric and normal bilaterally.    DIAGNOSTIC DATA (LABS, IMAGING, TESTING) - I reviewed patient records, labs, notes, testing and imaging myself where available.  Lab Results  Component Value Date   WBC 11.2 (H) 03/26/2017   HGB 13.6 03/26/2017   HCT 42.2 03/26/2017   MCV 90.4 03/26/2017   PLT 334 03/26/2017      Component Value Date/Time   NA 136 03/26/2017 1204   K 4.1 03/26/2017 1204   CL 99 (L) 03/26/2017 1204   CO2 24 03/26/2017 1204   GLUCOSE 129 (H) 03/26/2017 1204   BUN 20 03/26/2017 1204   CREATININE 0.86 03/26/2017 1204   CALCIUM  10.2 03/26/2017 1204   PROT 6.8 03/04/2020 1548   ALBUMIN  3.3 (L) 03/14/2017 0621   AST 22 03/14/2017 0621   ALT 15 (L) 03/14/2017 0621   ALKPHOS 72 03/14/2017 0621   BILITOT 0.9 03/14/2017 0621   GFRNONAA >60 03/26/2017 1204   GFRAA >60 03/26/2017 1204   No results found for: CHOL, HDL, LDLCALC, LDLDIRECT, TRIG, CHOLHDL Lab Results  Component Value Date   HGBA1C 6.9 (H) 09/06/2016   Lab Results  Component Value Date   VITAMINB12 480 03/04/2020   Lab Results  Component Value Date   TSH 1.910 03/09/2016        No data to display              06/29/2023   10:47 AM 01/17/2022    2:50 PM 03/07/2021    2:35 PM 03/04/2020    2:39 PM 11/27/2018     2:26 PM  Montreal Cognitive Assessment   Visuospatial/ Executive (0/5) 4 5 5 5 4   Naming (0/3) 3 3 2 3 3   Attention: Read list of digits (0/2) 2 2 2 2 2   Attention: Read list of letters (0/1) 1 1 1 1 1   Attention: Serial 7 subtraction starting at 100 (0/3) 1 3 3 3 3   Language: Repeat phrase (0/2) 2 2 2 2 2   Language : Fluency (0/1) 0 0 1 1 0  Abstraction (0/2) 2 2 2 2 2   Delayed Recall (0/5) 1 0 0 0 0  Orientation (0/6) 2 5 2 6 6   Total 18 23 20 25 23   Adjusted Score (based on education)    25  ASSESSMENT AND PLAN  88 y.o. year old male  has a past medical history of Alzheimer disease (HCC), Arthritis, Cancer (HCC), Diabetes mellitus without complication (HCC), GERD (gastroesophageal reflux disease), Hyperlipidemia, and Hypertension. here with    Late onset Alzheimer's disease without behavioral disturbance (HCC)  History of subdural hematoma  Polyneuropathy  Daniel [name] feels they memory is fairly stable, however, Mrs Flenner has noted some progression. MOCA 18/30, last 23/30 in 01/2022. He appears to be tolerating Exelon  patches. We will continue 9.5mg  daily. I have advised Mrs Kintz to monitor his pulse closely. HR 51 in office, today. He is asymptomatic. He will continue memantine 5mg  BID per PCP. He was encouraged to continue physical and mental exercise every day. He was advised not to drive. I will provide letter for Mrs Demas to submit to financial institution. I do not feel Daniel [name] is mentally capable of making financial decisions independently. Memory compensation strategies reviewed. I will have him follow up in 6 months, sooner if needed. He and his wife verbalize understanding and agreement with this plan.    No orders of the defined types were placed in this encounter.    Meds ordered this encounter  Medications   rivastigmine  (EXELON ) 9.5 mg/24hr    Sig: APPLY 1 PATCH(9.5 MG) TOPICALLY TO THE SKIN DAILY    Dispense:  90 patch    Refill:  3     Supervising Provider:   AHERN, ANTONIA B [[id number]]    I personally spent a total of 45 minutes in the care of the patient today including preparing to see the patient, getting/reviewing separately obtained history, performing a medically appropriate exam/evaluation, counseling and educating, placing orders, documenting clinical information in the EHR, independently interpreting results, and communicating results.    Terrilyn Fick, MSN, FNP-C 07/02/2023, 10:22 AM  [facility] [address] [place], [place] [address] [phone]

## 2023-06-28 NOTE — Patient Instructions (Signed)
 Below is our plan:  We will continue Exelon  9.5mg  daily. Please keep an eye on your pulse at home. Let me know if it drops below 50 consistently or if you have any concerns of dizziness. Continue memantine 5mg  twice daily. I will get a letter typed up and mailed out to you next week.   Please make sure you are staying well hydrated. I recommend 50-60 ounces daily. Well balanced diet and regular exercise encouraged. Consistent sleep schedule with 6-8 hours recommended.   Please continue follow up with care team as directed.   Follow up with me in 6 months   You may receive a survey regarding today's visit. I encourage you to leave honest feed back as I do use this information to improve patient care. Thank you for seeing me today!   Management of Memory Problems   There are some general things you can do to help manage your memory problems.  Your memory may not in fact recover, but by using techniques and strategies you will be able to manage your memory difficulties better.   1)  Establish a routine. Try to establish and then stick to a regular routine.  By doing this, you will get used to what to expect and you will reduce the need to rely on your memory.  Also, try to do things at the same time of day, such as taking your medication or checking your calendar first thing in the morning. Think about think that you can do as a part of a regular routine and make a list.  Then enter them into a daily planner to remind you.  This will help you establish a routine.   2)  Organize your environment. Organize your environment so that it is uncluttered.  Decrease visual stimulation.  Place everyday items such as keys or cell phone in the same place every day (ie.  Basket next to front door) Use post it notes with a brief message to yourself (ie. Turn off light, lock the door) Use labels to indicate where things go (ie. Which cupboards are for food, dishes, etc.) Keep a notepad and pen by the telephone  to take messages   3)  Memory Aids A diary or journal/notebook/daily planner Making a list (shopping list, chore list, to do list that needs to be done) Using an alarm as a reminder (kitchen timer or cell phone alarm) Using cell phone to store information (Notes, Calendar, Reminders) Calendar/White board placed in a prominent position Post-it notes   In order for memory aids to be useful, you need to have good habits.  It's no good remembering to make a note in your journal if you don't remember to look in it.  Try setting aside a certain time of day to look in journal.   4)  Improving mood and managing fatigue. There may be other factors that contribute to memory difficulties.  Factors, such as anxiety, depression and tiredness can affect memory. Regular gentle exercise can help improve your mood and give you more energy. Exercise: there are short videos created by the [employer] on Health specially for older adults: https://bit.ly/2I30q97.  Mediterranean diet: which emphasizes fruits, vegetables, whole grains, legumes, fish, and other seafood; unsaturated fats such as olive oils; and low amounts of red meat, eggs, and sweets. A variation of this, called MIND (Mediterranean-DASH Intervention for Neurodegenerative Delay) incorporates the DASH (Dietary Approaches to Stop Hypertension) diet, which has been shown to lower high blood pressure, a risk factor  for Alzheimer's disease. More information at: ExitMarketing.de.  Aerobic exercise that improve heart health is also good for the mind.  [employer] on Aging have short videos for exercises that you can do at home: BlindWorkshop.com.pt Simple relaxation techniques may help relieve symptoms of anxiety Try to get back to completing activities or hobbies you enjoyed doing in the past. Learn to pace yourself through activities to decrease fatigue. Find out  about some local support groups where you can share experiences with others. Try and achieve 7-8 hours of sleep at night.   Resources for Family/Caregiver  Online caregiver support groups can be found at WesternTunes.it or call Alzheimer's Association's 24/7 hotline: 469-454-0982. [facility] Memory Counseling Program offers in-person, virtual support groups and individual counseling for both care partners and persons with memory loss. Call for more information at ([phone])200-4520.   Advanced care plan: there are two types of Power of Attorney: healthcare and durable. Healthcare POA is a designated person to make healthcare decisions on your behalf if you were too sick to make them yourself. This person can be selected and documented by your physician. Durable POA has to be set up with a lawyer who takes charge of your finances and estate if you were too sick or cognitively impaired to manage your finances accurately. You can find a local Elder Therapist, art here: NewportRanch.at.  Check out www.planyourlifespan.org, which will help you plan before a crisis and decide who will take care of life considerations in a circumstance where you may not be able to speak for yourself.   Helpful books (available on [employer] or your local bookstore):  By Dr. Albina Hull: Keeping Love Alive as Memories Fade: The 5 Love Languages and the Alzheimer's Journey Oct 17, 2014 The Dementia Care Partner's Workbook: A Guide for Understanding, Education, and [employer] - June 16, 2017.  Both available for less than $15.   Coping with behavior change in dementia: a family caregiver's guide by Asberry Lav & Leonides Ramp A Caregiver's Guide to Dementia: Using Activities and Other Strategies to Prevent, Reduce and Manage Behavioral Symptoms by Beth Brooke. [place] and Catherine Piersol.  Creating Moments of Joy for the Person with Alzheimer's or Dementia 4th edition by Hardy Lia  Caregiver videos on common behaviors related to  dementia: PopulationGame.pl  [place] Caregiver Portal: free to sign up, links to local resources: [email or web address]-[email or web address]/login

## 2023-06-29 ENCOUNTER — Ambulatory Visit (INDEPENDENT_AMBULATORY_CARE_PROVIDER_SITE_OTHER): Admitting: Family Medicine

## 2023-06-29 ENCOUNTER — Encounter: Payer: Self-pay | Admitting: Family Medicine

## 2023-06-29 VITALS — BP 129/72 | HR 51 | Ht 72.0 in | Wt 163.0 lb

## 2023-06-29 DIAGNOSIS — Z8679 Personal history of other diseases of the circulatory system: Secondary | ICD-10-CM | POA: Diagnosis not present

## 2023-06-29 DIAGNOSIS — G629 Polyneuropathy, unspecified: Secondary | ICD-10-CM

## 2023-06-29 DIAGNOSIS — F0280 Dementia in other diseases classified elsewhere without behavioral disturbance: Secondary | ICD-10-CM

## 2023-06-29 DIAGNOSIS — G301 Alzheimer's disease with late onset: Secondary | ICD-10-CM

## 2023-07-02 ENCOUNTER — Encounter: Payer: Self-pay | Admitting: Family Medicine

## 2023-07-02 ENCOUNTER — Telehealth: Payer: Self-pay | Admitting: *Deleted

## 2023-07-02 MED ORDER — RIVASTIGMINE 9.5 MG/24HR TD PT24: MEDICATED_PATCH | TRANSDERMAL | 3 refills | Status: AC

## 2023-07-02 NOTE — Telephone Encounter (Signed)
 Left message for patient to call regarding letter ready for pick up from office visit on 06/29/23 or can be emailed ( would need to confirm email address if so)

## 2023-07-02 NOTE — Telephone Encounter (Signed)
 I spoke w/ Amy. Letter is to be mailed. I placed in mail.

## 2023-10-27 ENCOUNTER — Emergency Department (HOSPITAL_COMMUNITY)

## 2023-10-27 ENCOUNTER — Observation Stay (HOSPITAL_COMMUNITY)
Admission: EM | Admit: 2023-10-27 | Discharge: 2023-10-30 | Disposition: A | Discharge status: Home / Self Care | Attending: Family Medicine | Admitting: Family Medicine

## 2023-10-27 ENCOUNTER — Encounter (HOSPITAL_COMMUNITY): Payer: Self-pay

## 2023-10-27 DIAGNOSIS — M160 Bilateral primary osteoarthritis of hip: Secondary | ICD-10-CM | POA: Diagnosis not present

## 2023-10-27 DIAGNOSIS — G309 Alzheimer's disease, unspecified: Secondary | ICD-10-CM | POA: Insufficient documentation

## 2023-10-27 DIAGNOSIS — I10 Essential (primary) hypertension: Secondary | ICD-10-CM | POA: Insufficient documentation

## 2023-10-27 DIAGNOSIS — W19XXXA Unspecified fall, initial encounter: Secondary | ICD-10-CM | POA: Diagnosis not present

## 2023-10-27 DIAGNOSIS — S0101XA Laceration without foreign body of scalp, initial encounter: Secondary | ICD-10-CM | POA: Diagnosis not present

## 2023-10-27 DIAGNOSIS — M25512 Pain in left shoulder: Secondary | ICD-10-CM | POA: Insufficient documentation

## 2023-10-27 DIAGNOSIS — E860 Dehydration: Secondary | ICD-10-CM | POA: Diagnosis not present

## 2023-10-27 DIAGNOSIS — R079 Chest pain, unspecified: Secondary | ICD-10-CM | POA: Diagnosis not present

## 2023-10-27 DIAGNOSIS — I615 Nontraumatic intracerebral hemorrhage, intraventricular: Secondary | ICD-10-CM

## 2023-10-27 DIAGNOSIS — E785 Hyperlipidemia, unspecified: Secondary | ICD-10-CM | POA: Insufficient documentation

## 2023-10-27 DIAGNOSIS — S06360A Traumatic hemorrhage of cerebrum, unspecified, without loss of consciousness, initial encounter: Secondary | ICD-10-CM | POA: Diagnosis not present

## 2023-10-27 DIAGNOSIS — I629 Nontraumatic intracranial hemorrhage, unspecified: Secondary | ICD-10-CM

## 2023-10-27 DIAGNOSIS — G44309 Post-traumatic headache, unspecified, not intractable: Secondary | ICD-10-CM | POA: Diagnosis not present

## 2023-10-27 DIAGNOSIS — F0280 Dementia in other diseases classified elsewhere without behavioral disturbance: Secondary | ICD-10-CM | POA: Insufficient documentation

## 2023-10-27 DIAGNOSIS — Z23 Encounter for immunization: Secondary | ICD-10-CM | POA: Diagnosis not present

## 2023-10-27 DIAGNOSIS — G319 Degenerative disease of nervous system, unspecified: Secondary | ICD-10-CM | POA: Diagnosis not present

## 2023-10-27 DIAGNOSIS — R001 Bradycardia, unspecified: Secondary | ICD-10-CM | POA: Diagnosis not present

## 2023-10-27 DIAGNOSIS — S0181XA Laceration without foreign body of other part of head, initial encounter: Secondary | ICD-10-CM | POA: Diagnosis not present

## 2023-10-27 DIAGNOSIS — E875 Hyperkalemia: Secondary | ICD-10-CM | POA: Insufficient documentation

## 2023-10-27 DIAGNOSIS — K219 Gastro-esophageal reflux disease without esophagitis: Secondary | ICD-10-CM | POA: Insufficient documentation

## 2023-10-27 DIAGNOSIS — Z981 Arthrodesis status: Secondary | ICD-10-CM | POA: Diagnosis not present

## 2023-10-27 DIAGNOSIS — M542 Cervicalgia: Secondary | ICD-10-CM | POA: Diagnosis not present

## 2023-10-27 DIAGNOSIS — R109 Unspecified abdominal pain: Secondary | ICD-10-CM | POA: Diagnosis not present

## 2023-10-27 DIAGNOSIS — Z79899 Other long term (current) drug therapy: Secondary | ICD-10-CM | POA: Insufficient documentation

## 2023-10-27 DIAGNOSIS — R9389 Abnormal findings on diagnostic imaging of other specified body structures: Secondary | ICD-10-CM | POA: Diagnosis not present

## 2023-10-27 DIAGNOSIS — Z043 Encounter for examination and observation following other accident: Secondary | ICD-10-CM | POA: Diagnosis not present

## 2023-10-27 DIAGNOSIS — S0990XA Unspecified injury of head, initial encounter: Secondary | ICD-10-CM | POA: Diagnosis present

## 2023-10-27 DIAGNOSIS — Y92008 Other place in unspecified non-institutional (private) residence as the place of occurrence of the external cause: Secondary | ICD-10-CM | POA: Insufficient documentation

## 2023-10-27 LAB — CBC WITH DIFFERENTIAL/PLATELET
Abs Immature Granulocytes: 0.08 K/uL — ABNORMAL HIGH (ref 0.00–0.07)
Basophils Absolute: 0.1 K/uL (ref 0.0–0.1)
Basophils Relative: 1 %
Eosinophils Absolute: 0.1 K/uL (ref 0.0–0.5)
Eosinophils Relative: 1 %
HCT: 49.5 % (ref 39.0–52.0)
Hemoglobin: 15.8 g/dL (ref 13.0–17.0)
Immature Granulocytes: 1 %
Lymphocytes Relative: 10 %
Lymphs Abs: 1 K/uL (ref 0.7–4.0)
MCH: 31.3 pg (ref 26.0–34.0)
MCHC: 31.9 g/dL (ref 30.0–36.0)
MCV: 98 fL (ref 80.0–100.0)
Monocytes Absolute: 0.4 K/uL (ref 0.1–1.0)
Monocytes Relative: 4 %
Neutro Abs: 8.9 K/uL — ABNORMAL HIGH (ref 1.7–7.7)
Neutrophils Relative %: 83 %
Platelets: 162 K/uL (ref 150–400)
RBC: 5.05 MIL/uL (ref 4.22–5.81)
RDW: 12.6 % (ref 11.5–15.5)
WBC: 10.5 K/uL (ref 4.0–10.5)
nRBC: 0 % (ref 0.0–0.2)

## 2023-10-27 LAB — BASIC METABOLIC PANEL WITH GFR
Anion gap: 10 (ref 5–15)
BUN: 25 mg/dL — ABNORMAL HIGH (ref 8–23)
CO2: 25 mmol/L (ref 22–32)
Calcium: 9.8 mg/dL (ref 8.9–10.3)
Chloride: 105 mmol/L (ref 98–111)
Creatinine, Ser: 1.11 mg/dL (ref 0.61–1.24)
GFR, Estimated: >60 mL/min (ref >60)
Glucose, Bld: 135 mg/dL — ABNORMAL HIGH (ref 70–99)
Potassium: 5.2 mmol/L — ABNORMAL HIGH (ref 3.5–5.1)
Sodium: 140 mmol/L (ref 135–145)

## 2023-10-27 MED ORDER — IOHEXOL 300 MG/ML  SOLN
100.0000 mL | Freq: Once | INTRAMUSCULAR | Status: AC | PRN
  Administered 2023-10-27: 100 mL via INTRAVENOUS

## 2023-10-27 MED ORDER — TETANUS-DIPHTH-ACELL PERTUSSIS 5-2-15.5 LF-MCG/0.5 IM SUSP
0.5000 mL | Freq: Once | INTRAMUSCULAR | Status: AC
  Administered 2023-10-27: 0.5 mL via INTRAMUSCULAR
  Filled 2023-10-27: qty 0.5

## 2023-10-27 MED ORDER — LIDOCAINE HCL (PF) 1 % IJ SOLN
5.0000 mL | Freq: Once | INTRAMUSCULAR | Status: DC
  Filled 2023-10-27: qty 30

## 2023-10-27 NOTE — ED Notes (Signed)
 Pt to Xray

## 2023-10-27 NOTE — ED Triage Notes (Signed)
 Pt BIB ems after experiencing a fall on his porch, he missed a step, fell, and hit his head. Pt has a hematoma and laceration on the left side of his head, along with left sided rib pain, and left hip tenderness and neck pain. Denies dizziness, LOC, or lightheadedness, not on blood thinners. Hx of DM and alzheimer disease. A&Ox2. VS stable.

## 2023-10-27 NOTE — ED Provider Notes (Signed)
 [place] EMERGENCY DEPARTMENT AT [facility] Provider Note   CSN: [id number] Arrival date & time: 10/27/23  1712     Patient presents with: Daniel [name] is a 88 y.o. male past medical history significant for Alzheimer's disease, diabetes, hypertension, and GERD presents today, per triage note, after a mechanical fall on his porch where he fell and hit his head.  Patient with laceration to the left side of his head along with left-sided rib pain, neck pain, and left hip tenderness.  At this time patient states that he does not recall the event.  Patient is not on a blood thinner per his medication list.    Fall       Prior to Admission medications   Medication Sig Start Date End Date Taking? Authorizing Provider  atorvastatin  (LIPITOR) 40 MG tablet Take 40 mg by mouth daily at 6 (six) AM.     [provider]  calcium -vitamin D  (OSCAL WITH D) 500-200 MG-UNIT tablet Take 1 tablet by mouth daily with breakfast. 03/28/17   Angiulli, Toribio PARAS, PA-C  celecoxib (CELEBREX) 200 MG capsule Take 200 mg by mouth daily. 06/26/17   [provider]  Cholecalciferol  50 MCG (2000 UT) CAPS Take 2,000 Units by mouth daily.     [provider]  dicyclomine (BENTYL) 20 MG tablet TK 1 T PO TID PRN Patient not taking: Reported on 06/29/2023 11/06/17   [provider]  empagliflozin (JARDIANCE) 10 MG TABS tablet Take 1 tablet by mouth daily. 06/21/20   [provider]  ezetimibe  (ZETIA ) 10 MG tablet Take 1 tablet (10 mg total) by mouth every evening. Patient not taking: Reported on 06/29/2023 03/27/17   Angiulli, Toribio PARAS, PA-C  famotidine (PEPCID) 40 MG tablet Take 40 mg by mouth 3 (three) times daily. Patient not taking: Reported on 06/29/2023    [provider]  memantine (NAMENDA) 5 MG tablet Take 5 mg by mouth daily. 05/29/23   [provider]  Multiple Minerals-Vitamins (CALCIUM -MAGNESIUM -ZINC -D3) TABS Take 1 tablet by  mouth daily.     [provider]  naproxen sodium (ALEVE) 220 MG tablet Take 220 mg by mouth. Patient not taking: Reported on 06/29/2023    [provider]  rivastigmine  (EXELON ) 9.5 mg/24hr APPLY 1 PATCH(9.5 MG) TOPICALLY TO THE SKIN DAILY 07/02/23   Lomax, Amy, NP  sitaGLIPtin (JANUVIA) 50 MG tablet Take 50 mg by mouth daily.    [provider]  tamsulosin  (FLOMAX ) 0.4 MG CAPS capsule Take 0.4 mg by mouth at bedtime. 10/21/18   [provider]    Allergies: Donepezil  and Tape    Review of Systems  Musculoskeletal:  Positive for arthralgias and neck pain.  Skin:  Positive for wound.    Updated Vital Signs BP (!) 161/76 (BP Location: Left Arm)   Pulse (!) 51   Temp (!) 97.4 F (36.3 C) (Oral)   Resp 16   SpO2 97%   Physical Exam Vitals and nursing note reviewed.  Constitutional:      General: He is not in acute distress.    Appearance: He is well-developed. He is not toxic-appearing.  HENT:     Head: Normocephalic. No raccoon eyes or Battle's sign.     Nose: Nose normal.     Mouth/Throat:     Mouth: Mucous membranes are dry.  Eyes:     Conjunctiva/sclera: Conjunctivae normal.  Neck:     Comments: Patient in c-collar Cardiovascular:  Rate and Rhythm: Normal rate and regular rhythm.     Pulses: Normal pulses.     Heart sounds: Normal heart sounds. No murmur heard. Pulmonary:     Effort: Pulmonary effort is normal. No respiratory distress.     Breath sounds: Normal breath sounds.  Abdominal:     General: There is no distension.     Palpations: Abdomen is soft.     Tenderness: There is no abdominal tenderness.  Musculoskeletal:        General: No swelling.     Cervical back: Neck supple.     Comments: Patient denies tenderness to palpation of bilateral arms, legs, and hips at this time.  No obvious deformity noted on exam.  Skin:    General: Skin is warm and dry.     Capillary Refill: Capillary refill takes less than 2 seconds.   Neurological:     Mental Status: He is alert. Mental status is at baseline.  Psychiatric:        Mood and Affect: Mood normal.     (all labs ordered are listed, but only abnormal results are displayed) Labs Reviewed  BASIC METABOLIC PANEL WITH GFR - Abnormal; Notable for the following components:      Result Value   Potassium 5.2 (*)    Glucose, Bld 135 (*)    BUN 25 (*)    All other components within normal limits  CBC WITH DIFFERENTIAL/PLATELET - Abnormal; Notable for the following components:   Neutro Abs 8.9 (*)    Abs Immature Granulocytes 0.08 (*)    All other components within normal limits    EKG: None  Radiology: CT Head Wo Contrast Result Date: 10/27/2023 CLINICAL DATA:  Recent fall with headaches and neck pain, initial encounter EXAM: CT HEAD WITHOUT CONTRAST CT CERVICAL SPINE WITHOUT CONTRAST TECHNIQUE: Multidetector CT imaging of the head and cervical spine was performed following the standard protocol without intravenous contrast. Multiplanar CT image reconstructions of the cervical spine were also generated. RADIATION DOSE REDUCTION: This exam was performed according to the departmental dose-optimization program which includes automated exposure control, adjustment of the mA and/or kV according to patient size and/or use of iterative reconstruction technique. COMPARISON:  01/20/2022 FINDINGS: CT HEAD FINDINGS Brain: Mild atrophic changes are noted. Chronic hypodense subdural collections are again seen bilaterally stable from the prior exam. A tiny amount of intraventricular blood is noted posteriorly in the left lateral ventricle. Additionally a small focus of hyperdensity is noted in the mid aspect of the left lateral ventricle best seen on coronal image number 37 of series 6. This likely represents an area of adherent thrombus given the hemorrhage. No other focal area of hemorrhage is seen. Vascular: No hyperdense vessel or unexpected calcification. Skull: Burr holes are  noted in the vertex similar to that seen on prior exam consistent with prior subdural drainage. Sinuses/Orbits: No acute finding. Other: Mild soft tissue swelling is noted in the left frontal region. CT CERVICAL SPINE FINDINGS Alignment: Within normal limits. Skull base and vertebrae: 7 cervical segments are well visualized. Vertebral body height is well maintained. Multilevel facet hypertrophic changes and osteophytic changes are seen. No acute fracture or acute facet abnormality is noted. The odontoid is within normal limits. 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 Soft tissues and spinal canal: Surrounding soft tissue structures are within normal limits Upper chest: Visualized lung apices are unremarkable. Other: None IMPRESSION: CT of the head: Findings consistent with acute intraventricular hemorrhage on the left as  described. Chronic subdural collections similar to that seen on the prior exam. CT of the cervical spine: Degenerative changes without acute abnormality. Electronically Signed   By: Oneil Devonshire M.D.   On: 10/27/2023 20:17   CT Cervical Spine Wo Contrast Result Date: 10/27/2023 CLINICAL DATA:  Recent fall with headaches and neck pain, initial encounter EXAM: CT HEAD WITHOUT CONTRAST CT CERVICAL SPINE WITHOUT CONTRAST TECHNIQUE: Multidetector CT imaging of the head and cervical spine was performed following the standard protocol without intravenous contrast. Multiplanar CT image reconstructions of the cervical spine were also generated. RADIATION DOSE REDUCTION: This exam was performed according to the departmental dose-optimization program which includes automated exposure control, adjustment of the mA and/or kV according to patient size and/or use of iterative reconstruction technique. COMPARISON:  01/20/2022 FINDINGS: CT HEAD FINDINGS Brain: Mild atrophic changes are noted. Chronic hypodense subdural collections are again seen bilaterally stable from the prior exam. A tiny amount of  intraventricular blood is noted posteriorly in the left lateral ventricle. Additionally a small focus of hyperdensity is noted in the mid aspect of the left lateral ventricle best seen on coronal image number 37 of series 6. This likely represents an area of adherent thrombus given the hemorrhage. No other focal area of hemorrhage is seen. Vascular: No hyperdense vessel or unexpected calcification. Skull: Burr holes are noted in the vertex similar to that seen on prior exam consistent with prior subdural drainage. Sinuses/Orbits: No acute finding. Other: Mild soft tissue swelling is noted in the left frontal region. CT CERVICAL SPINE FINDINGS Alignment: Within normal limits. Skull base and vertebrae: 7 cervical segments are well visualized. Vertebral body height is well maintained. Multilevel facet hypertrophic changes and osteophytic changes are seen. No acute fracture or acute facet abnormality is noted. The odontoid is within normal limits. 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 Soft tissues and spinal canal: Surrounding soft tissue structures are within normal limits Upper chest: Visualized lung apices are unremarkable. Other: None IMPRESSION: CT of the head: Findings consistent with acute intraventricular hemorrhage on the left as described. Chronic subdural collections similar to that seen on the prior exam. CT of the cervical spine: Degenerative changes without acute abnormality. Electronically Signed   By: Oneil Devonshire M.D.   On: 10/27/2023 20:17   DG Chest 1 View Result Date: 10/27/2023 CLINICAL DATA:  Fall EXAM: CHEST  1 VIEW COMPARISON:  None available FINDINGS: Mild elevation of the right hemidiaphragm. Heart and mediastinal contours are within normal limits. No focal opacities or effusions. No acute bony abnormality. IMPRESSION: No active disease. Electronically Signed   By: Franky Crease M.D.   On: 10/27/2023 19:34   DG HIP UNILAT WITH PELVIS 2-3 VIEWS LEFT Result Date:  10/27/2023 CLINICAL DATA:  Fall EXAM: DG HIP (WITH OR WITHOUT PELVIS) 2-3V LEFT COMPARISON:  None Available. FINDINGS: Mild symmetric degenerative changes in the hips. Postoperative changes in the lower lumbar spine. No acute bony abnormality. Specifically, no fracture, subluxation, or dislocation. IMPRESSION: No acute bony abnormality. Electronically Signed   By: Franky Crease M.D.   On: 10/27/2023 19:33     Procedures   Medications Ordered in the ED  lidocaine  (PF) (XYLOCAINE ) 1 % injection 5 mL (has no administration in time range)  Tdap (ADACEL) injection 0.5 mL (has no administration in time range)  Medical Decision Making Amount and/or Complexity of Data Reviewed Labs: ordered. Radiology: ordered.   This patient presents to the ED for concern of Fall differential diagnosis includes brain bleed, C-spine injury, rib fracture, hip fracture   Lab Tests:  I Ordered, and personally interpreted labs.  The pertinent results include: CBC unremarkable, mild hypokalemia at 5.2, elevated bun at 25   Imaging Studies ordered:  I ordered imaging studies including CT head and C-spine I independently visualized and interpreted imaging which showed pending acute intraventricular hemorrhage on the left as described.  Degenerative changes without acute abnormality I agree with the radiologist interpretation Chest x-ray which showed negative Left hip x-ray which showed negative  Consulted neurosurgery, Dr. Darnella he stated that there is no mention or follow-up needed.  Patient signed out to Merl Overland, PA-C pending laceration repair.  Please refer to their note for full ED course        Final diagnoses:  None    ED Discharge Orders     None          Francis Ileana LOISE DEVONNA 10/27/23 2032    Melvenia Motto, MD 10/27/23 2333

## 2023-10-27 NOTE — Progress Notes (Signed)
 Patient with Alzheimer's dementia who presents after fall. CT head shows tiny left lateral IVH. He is not on anticoagulation/antiplatelets  No further imaging needed No neurosurgical intervention  Ok to discharge from nsgy standpoint. Follow-up PRN If patient is being admitted and requires DVT ppx, ok on 10/14

## 2023-10-27 NOTE — ED Provider Notes (Signed)
 Physical Exam  BP (!) 144/98 (BP Location: Right Arm)   Pulse (!) 50   Temp (!) 97.5 F (36.4 C) (Oral)   Resp 16   SpO2 99%   Physical Exam Vitals and nursing note reviewed.  HENT:     Head: Normocephalic.     Comments: 3 cm laceration left temporal. Bleeding is controlled.    Mouth/Throat:     Mouth: Mucous membranes are moist.  Eyes:     Pupils: Pupils are equal, round, and reactive to light.  Neck:     Comments: Cervical collar removed, no midline tenderness along the cervical spine.  Cardiovascular:     Rate and Rhythm: Bradycardia present.  Pulmonary:     Effort: Pulmonary effort is normal.     Breath sounds: No wheezing or rales.     Comments: No absent breath sounds.  Chest:    Abdominal:     General: Abdomen is flat.     Palpations: Abdomen is soft.  Musculoskeletal:     Cervical back: Normal range of motion and neck supple.  Neurological:     Mental Status: He is alert. Mental status is at baseline.     Comments: Underlying dementia, at baseline per wife at the bedside.     Procedures  .Laceration Repair  Date/Time: 10/27/2023 11:28 PM  Performed by: Christiaan Strebeck, PA-C Authorized by: Ronith Berti, PA-C   Consent:    Consent obtained:  Verbal   Consent given by:  Spouse   Risks discussed:  Infection Universal protocol:    Patient identity confirmed:  Verbally with patient Laceration details:    Location:  Scalp   Scalp location:  L temporal   Length (cm):  3   Depth (mm):  1 Treatment:    Amount of cleaning:  Extensive Skin repair:    Repair method:  Staples   Number of staples:  3 Approximation:    Approximation:  Close Repair type:    Repair type:  Simple Post-procedure details:    Dressing:  Open (no dressing)   Procedure completion:  Tolerated well, no immediate complications   ED Course / MDM    Medical Decision Making Amount and/or Complexity of Data Reviewed Labs: ordered. Radiology: ordered.  Risk Prescription drug  management. Decision regarding hospitalization.    Patient care assumed from Kayla K. PA at shift change, please see her note for a full HPI. Briefly, patient with underlying dementia here status post fall.  No information obtained from family.  Does endorse some pain along the left side of his chest.  Imaging of his head, cervical spine, chest were done along with pelvis.  Workable for a small cranial hemorrhage.  The case was discussed with neurosurgery who felt that patient did not need any further workup.  Did speak further with his wife [name] at the bedside, also spoke to tending overseeing the case Dr. Melvenia, who has evaluated patient and we do feel that patient warrants admission at this time as he is currently living alone with his 86 year old wife [name].  His wife also tells us  that she does not feel comfortable taking him home.  I do feel that this is reasonable at this time.  11:28 PM spoke to Dr. Abigail, hospitalist service who has agreed on accepting patient at this time.  We are pending a CT chest abdomen and pelvis, will also obtain an EKG for further evaluation as patient is complaining of pain along the left side of his chest.  Portions of this note were generated with Scientist, clinical (histocompatibility and immunogenetics). Dictation errors may occur despite best attempts at proofreading.        Bianco Cange, PA-C 10/27/23 [id number]    Suzette Pac, MD 10/29/23 1144

## 2023-10-28 ENCOUNTER — Other Ambulatory Visit: Payer: Self-pay

## 2023-10-28 DIAGNOSIS — I615 Nontraumatic intracerebral hemorrhage, intraventricular: Secondary | ICD-10-CM

## 2023-10-28 DIAGNOSIS — S0181XA Laceration without foreign body of other part of head, initial encounter: Secondary | ICD-10-CM

## 2023-10-28 DIAGNOSIS — R001 Bradycardia, unspecified: Secondary | ICD-10-CM

## 2023-10-28 DIAGNOSIS — E860 Dehydration: Secondary | ICD-10-CM

## 2023-10-28 DIAGNOSIS — W19XXXA Unspecified fall, initial encounter: Secondary | ICD-10-CM

## 2023-10-28 LAB — COMPREHENSIVE METABOLIC PANEL WITH GFR
ALT: 16 U/L (ref 0–44)
AST: 26 U/L (ref 15–41)
Albumin: 4.4 g/dL (ref 3.5–5.0)
Alkaline Phosphatase: 81 U/L (ref 38–126)
Anion gap: 12 (ref 5–15)
BUN: 26 mg/dL — ABNORMAL HIGH (ref 8–23)
CO2: 22 mmol/L (ref 22–32)
Calcium: 9.8 mg/dL (ref 8.9–10.3)
Chloride: 104 mmol/L (ref 98–111)
Creatinine, Ser: 1.13 mg/dL (ref 0.61–1.24)
GFR, Estimated: >60 mL/min (ref >60)
Glucose, Bld: 170 mg/dL — ABNORMAL HIGH (ref 70–99)
Potassium: 4.8 mmol/L (ref 3.5–5.1)
Sodium: 139 mmol/L (ref 135–145)
Total Bilirubin: 0.7 mg/dL (ref 0.0–1.2)
Total Protein: 7.3 g/dL (ref 6.5–8.1)

## 2023-10-28 LAB — GLUCOSE, CAPILLARY
Glucose-Capillary: 187 mg/dL — ABNORMAL HIGH (ref 70–99)
Glucose-Capillary: 191 mg/dL — ABNORMAL HIGH (ref 70–99)

## 2023-10-28 LAB — HEMOGLOBIN A1C
Hgb A1c MFr Bld: 6.8 % — ABNORMAL HIGH (ref 4.8–5.6)
Mean Plasma Glucose: 148.46 mg/dL

## 2023-10-28 LAB — CBG MONITORING, ED
Glucose-Capillary: 169 mg/dL — ABNORMAL HIGH (ref 70–99)
Glucose-Capillary: 158 mg/dL — ABNORMAL HIGH (ref 70–99)
Glucose-Capillary: 182 mg/dL — ABNORMAL HIGH (ref 70–99)

## 2023-10-28 MED ORDER — ACETAMINOPHEN 650 MG RE SUPP: 650.0000 mg | Freq: Four times a day (QID) | RECTAL | Status: DC | PRN

## 2023-10-28 MED ORDER — SODIUM CHLORIDE 0.9 % IV SOLN: INTRAVENOUS | Status: AC

## 2023-10-28 MED ORDER — INSULIN ASPART 100 UNIT/ML IJ SOLN
0.0000 [IU] | Freq: Every day | INTRAMUSCULAR | Status: DC
  Filled 2023-10-28: qty 0.05

## 2023-10-28 MED ORDER — ACETAMINOPHEN 325 MG PO TABS
650.0000 mg | ORAL_TABLET | Freq: Four times a day (QID) | ORAL | Status: DC | PRN
  Administered 2023-10-28 – 2023-10-29 (×3): 650 mg via ORAL
  Filled 2023-10-28 (×4): qty 2

## 2023-10-28 MED ORDER — INFLUENZA VAC SPLIT HIGH-DOSE 0.5 ML IM SUSY
0.5000 mL | PREFILLED_SYRINGE | INTRAMUSCULAR | Status: AC
  Administered 2023-10-30: 0.5 mL via INTRAMUSCULAR
  Filled 2023-10-28: qty 0.5

## 2023-10-28 MED ORDER — INSULIN ASPART 100 UNIT/ML IJ SOLN
0.0000 [IU] | Freq: Three times a day (TID) | INTRAMUSCULAR | Status: DC
  Administered 2023-10-28 (×3): 2 [IU] via SUBCUTANEOUS
  Administered 2023-10-29: 1 [IU] via SUBCUTANEOUS
  Administered 2023-10-29: 2 [IU] via SUBCUTANEOUS
  Administered 2023-10-29: 1 [IU] via SUBCUTANEOUS
  Administered 2023-10-30: 2 [IU] via SUBCUTANEOUS
  Filled 2023-10-28: qty 0.09

## 2023-10-28 NOTE — H&P (Signed)
 History and Physical    Daniel [name] FMW:[id number] DOB: [birth year] DOA: 10/27/2023  PCP: Clarice Nottingham, MD  Patient coming from: Home  Chief Complaint: Fall, head injury  HPI: Daniel [name] is a 88 y.o. male with medical history significant of Alzheimer's dementia, type 2 diabetes, hypertension, hyperlipidemia, GERD presented to the ED for evaluation of head injury after a mechanical fall.  He was noted to have a laceration on the left side of his head along with left-sided rib pain, neck pain, and left hip tenderness.  Not on anticoagulation or antiplatelet agents.  Left temporal laceration was repaired with 3 staples.  Tdap injection given.  Patient noted to be bradycardic with heart rate as low as high 40s but not hypotensive.  No significant findings on CBC.  BMP noted potassium 5.2, glucose 135, BUN 25, creatinine 1.1 (previously 0.8-1.0 on labs done in 2019).  CT head showing findings consistent with acute intraventricular hemorrhage on the left.  Showing chronic subdural collections similar to that seen on prior exam.  CT C-spine negative for acute findings.  CT chest/abdomen/pelvis negative for acute findings.  Showing soft tissue density surrounding fatty tissue in the mid abdomen likely representing an area of fat necrosis for which no further follow-up is recommended by radiologist.  X-ray of left hip/pelvis showing no acute bony abnormality.  Regarding intraventricular hemorrhage on brain imaging, neurosurgery recommended no further imaging or neurosurgical intervention and felt that it was okay to discharge the patient from their standpoint with follow-up PRN.  Patient has dementia and is able to give limited history.  He does not remember how he fell.  He is endorsing mild left-sided neck pain and left-sided rib pain.  Denies chest pressure or shortness of breath.  Review of Systems:  Review of Systems  All other systems reviewed and are negative.   Past Medical History:   Diagnosis Date   Alzheimer disease (HCC)    Arthritis    Cancer (HCC)    Skin   Diabetes mellitus without complication (HCC)    GERD (gastroesophageal reflux disease)    Hyperlipidemia    Hypertension     Past Surgical History:  Procedure Laterality Date   BACK SURGERY     BURR HOLE Bilateral 03/09/2017   Procedure: BURR HOLES FOR SUBDURAL HEMATOMA BILATERAL;  Surgeon: Lanis Pupa, MD;  Location: [facility] OR;  Service: Neurosurgery;  Laterality: Bilateral;   CATARACT EXTRACTION Bilateral 1987 89   ROTATOR CUFF REPAIR Left 2003     reports that he quit smoking about 45 years ago. His smoking use included cigarettes. He has never used smokeless tobacco. He reports current alcohol use. He reports that he does not use drugs.  Allergies  Allergen Reactions   Donepezil  Diarrhea   Tape Rash    PAPER TAPE IS PREFERRED, PLEASE    Family History  Problem Relation Age of Onset   Alzheimer's disease Mother    Alzheimer's disease Maternal Grandfather     Prior to Admission medications   Medication Sig Start Date End Date Taking? Authorizing Provider  atorvastatin  (LIPITOR) 40 MG tablet Take 40 mg by mouth daily at 6 (six) AM.     [provider]  calcium -vitamin D  (OSCAL WITH D) 500-200 MG-UNIT tablet Take 1 tablet by mouth daily with breakfast. 03/28/17   Angiulli, Toribio PARAS, PA-C  celecoxib (CELEBREX) 200 MG capsule Take 200 mg by mouth daily. 06/26/17   [provider]  Cholecalciferol  50 MCG (2000 UT) CAPS Take 2,000  Units by mouth daily.     [provider]  dicyclomine (BENTYL) 20 MG tablet TK 1 T PO TID PRN Patient not taking: Reported on 06/29/2023 11/06/17   [provider]  empagliflozin (JARDIANCE) 10 MG TABS tablet Take 1 tablet by mouth daily. 06/21/20   [provider]  ezetimibe  (ZETIA ) 10 MG tablet Take 1 tablet (10 mg total) by mouth every evening. Patient not taking: Reported on 06/29/2023 03/27/17   Angiulli, Toribio PARAS, PA-C   famotidine (PEPCID) 40 MG tablet Take 40 mg by mouth 3 (three) times daily. Patient not taking: Reported on 06/29/2023    [provider]  memantine (NAMENDA) 5 MG tablet Take 5 mg by mouth daily. 05/29/23   [provider]  Multiple Minerals-Vitamins (CALCIUM -MAGNESIUM -ZINC -D3) TABS Take 1 tablet by mouth daily.     [provider]  naproxen sodium (ALEVE) 220 MG tablet Take 220 mg by mouth. Patient not taking: Reported on 06/29/2023    [provider]  rivastigmine  (EXELON ) 9.5 mg/24hr APPLY 1 PATCH(9.5 MG) TOPICALLY TO THE SKIN DAILY 07/02/23   Lomax, Amy, NP  sitaGLIPtin (JANUVIA) 50 MG tablet Take 50 mg by mouth daily.    [provider]  tamsulosin  (FLOMAX ) 0.4 MG CAPS capsule Take 0.4 mg by mouth at bedtime. 10/21/18   [provider]    Physical Exam: Vitals:   10/27/23 1725 10/27/23 1728 10/27/23 1813 10/27/23 2107  BP: (!) 161/76   (!) 144/98  Pulse: (!) 51   (!) 50  Resp: 16   16  Temp: (!) 97.4 F (36.3 C)   (!) 97.5 F (36.4 C)  TempSrc: Oral   Oral  SpO2: 99% 97% 99% 99%    Physical Exam Vitals reviewed.  Constitutional:      General: He is not in acute distress. HENT:     Head: Normocephalic.     Comments: Left temporal forehead laceration with 3 sutures in place and no active bleeding Eyes:     Extraocular Movements: Extraocular movements intact.     Pupils: Pupils are equal, round, and reactive to light.  Cardiovascular:     Rate and Rhythm: Normal rate and regular rhythm.     Heart sounds: Normal heart sounds.  Pulmonary:     Effort: Pulmonary effort is normal. No respiratory distress.     Breath sounds: Normal breath sounds.  Abdominal:     General: Bowel sounds are normal.     Palpations: Abdomen is soft.     Tenderness: There is no abdominal tenderness. There is no guarding.  Musculoskeletal:     Cervical back: Normal range of motion.     Right lower leg: No edema.     Left lower leg: No edema.   Skin:    General: Skin is warm and dry.  Neurological:     General: No focal deficit present.     Mental Status: He is alert.     Cranial Nerves: No cranial nerve deficit.     Sensory: No sensory deficit.     Motor: No weakness.     Comments: Oriented to person and place     Labs on Admission: I have personally reviewed following labs and imaging studies  CBC: Recent Labs  Lab 10/27/23 1852  WBC 10.5  NEUTROABS 8.9*  HGB 15.8  HCT 49.5  MCV 98.0  PLT 162   Basic Metabolic Panel: Recent Labs  Lab 10/27/23 1852  NA 140  K 5.2*  CL 105  CO2 25  GLUCOSE 135*  BUN 25*  CREATININE 1.11  CALCIUM  9.8   GFR: CrCl cannot be calculated (Unknown ideal weight.). Liver Function Tests: No results for input(s): AST, ALT, ALKPHOS, BILITOT, PROT, ALBUMIN  in the last 168 hours. No results for input(s): LIPASE, AMYLASE in the last 168 hours. No results for input(s): AMMONIA in the last 168 hours. Coagulation Profile: No results for input(s): INR, PROTIME in the last 168 hours. Cardiac Enzymes: No results for input(s): CKTOTAL, CKMB, CKMBINDEX, TROPONINI in the last 168 hours. BNP (last 3 results) No results for input(s): PROBNP in the last 8760 hours. HbA1C: No results for input(s): HGBA1C in the last 72 hours. CBG: No results for input(s): GLUCAP in the last 168 hours. Lipid Profile: No results for input(s): CHOL, HDL, LDLCALC, TRIG, CHOLHDL, LDLDIRECT in the last 72 hours. Thyroid  Function Tests: No results for input(s): TSH, T4TOTAL, FREET4, T3FREE, THYROIDAB in the last 72 hours. Anemia Panel: No results for input(s): VITAMINB12, FOLATE, FERRITIN, TIBC, IRON, RETICCTPCT in the last 72 hours. Urine analysis:    Component Value Date/Time   COLORURINE YELLOW 03/19/2017 0819   APPEARANCEUR CLEAR 03/19/2017 0819   LABSPEC 1.015 03/19/2017 0819   PHURINE 7.0 03/19/2017 0819   GLUCOSEU NEGATIVE  03/19/2017 0819   HGBUR NEGATIVE 03/19/2017 0819   BILIRUBINUR NEGATIVE 03/19/2017 0819   KETONESUR NEGATIVE 03/19/2017 0819   PROTEINUR NEGATIVE 03/19/2017 0819   UROBILINOGEN 1.0 01/01/2012 1433   NITRITE NEGATIVE 03/19/2017 0819   LEUKOCYTESUR NEGATIVE 03/19/2017 0819    Radiological Exams on Admission: CT CHEST ABDOMEN PELVIS W CONTRAST Result Date: 10/27/2023 CLINICAL DATA:  Recent fall with chest and abdominal pain, initial encounter EXAM: CT CHEST, ABDOMEN, AND PELVIS WITH CONTRAST TECHNIQUE: Multidetector CT imaging of the chest, abdomen and pelvis was performed following the standard protocol during bolus administration of intravenous contrast. RADIATION DOSE REDUCTION: This exam was performed according to the departmental dose-optimization program which includes automated exposure control, adjustment of the mA and/or kV according to patient size and/or use of iterative reconstruction technique. CONTRAST:  100mL OMNIPAQUE IOHEXOL 300 MG/ML  SOLN COMPARISON:  Chest x-ray from earlier in the same day. FINDINGS: CT CHEST FINDINGS Cardiovascular: Atherosclerotic calcifications of the aorta are noted. No aneurysmal dilatation or dissection is seen. Pulmonary artery as visualized is within normal limits although not timed for embolus evaluation. Coronary calcifications noted. Mediastinum/Nodes: Thoracic inlet is within normal limits. No hilar or mediastinal adenopathy is noted. The esophagus as visualized is within normal limits. Lungs/Pleura: The lungs are well aerated bilaterally. No focal infiltrate or sizable effusion is noted. Musculoskeletal: Degenerative changes of the thoracic spine are seen. No acute rib abnormality is noted. CT ABDOMEN PELVIS FINDINGS Hepatobiliary: Fatty infiltration of the liver is noted. The gallbladder is unremarkable. Pancreas: Unremarkable. No pancreatic ductal dilatation or surrounding inflammatory changes. Spleen: Normal in size without focal abnormality.  Adrenals/Urinary Tract: Adrenal glands are within normal limits. Kidneys demonstrate a normal enhancement pattern. No renal calculi are seen. No obstructive changes are noted. The bladder is partially distended. Stomach/Bowel: Scattered fecal material is noted throughout the colon. The appendix is not well visualized. No inflammatory changes are identified to suggest appendicitis. Small bowel and stomach are within normal limits. Vascular/Lymphatic: Aortic atherosclerosis. No enlarged abdominal or pelvic lymph nodes. Reproductive: Prostate is unremarkable. Other: No abdominal wall hernia or abnormality. No abdominopelvic ascites. On image number 104 of series 2 there is a somewhat ovoid area of soft tissue surrounding central fatty attenuation. This likely represents an  area of fatty necrosis. Musculoskeletal: Degenerative changes and postsurgical changes in the lumbar spine are noted. IMPRESSION: CT of the chest: No acute abnormality noted. CT of the abdomen and pelvis: No acute abnormality to correspond with the given clinical history. Soft tissue density surrounding fatty tissue in the mid abdomen likely representing an area of fat necrosis. No further follow-up is recommended. Electronically Signed   By: Oneil Devonshire M.D.   On: 10/27/2023 23:42   CT Head Wo Contrast Result Date: 10/27/2023 CLINICAL DATA:  Recent fall with headaches and neck pain, initial encounter EXAM: CT HEAD WITHOUT CONTRAST CT CERVICAL SPINE WITHOUT CONTRAST TECHNIQUE: Multidetector CT imaging of the head and cervical spine was performed following the standard protocol without intravenous contrast. Multiplanar CT image reconstructions of the cervical spine were also generated. RADIATION DOSE REDUCTION: This exam was performed according to the departmental dose-optimization program which includes automated exposure control, adjustment of the mA and/or kV according to patient size and/or use of iterative reconstruction technique.  COMPARISON:  01/20/2022 FINDINGS: CT HEAD FINDINGS Brain: Mild atrophic changes are noted. Chronic hypodense subdural collections are again seen bilaterally stable from the prior exam. A tiny amount of intraventricular blood is noted posteriorly in the left lateral ventricle. Additionally a small focus of hyperdensity is noted in the mid aspect of the left lateral ventricle best seen on coronal image number 37 of series 6. This likely represents an area of adherent thrombus given the hemorrhage. No other focal area of hemorrhage is seen. Vascular: No hyperdense vessel or unexpected calcification. Skull: Burr holes are noted in the vertex similar to that seen on prior exam consistent with prior subdural drainage. Sinuses/Orbits: No acute finding. Other: Mild soft tissue swelling is noted in the left frontal region. CT CERVICAL SPINE FINDINGS Alignment: Within normal limits. Skull base and vertebrae: 7 cervical segments are well visualized. Vertebral body height is well maintained. Multilevel facet hypertrophic changes and osteophytic changes are seen. No acute fracture or acute facet abnormality is noted. The odontoid is within normal limits. 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 Soft tissues and spinal canal: Surrounding soft tissue structures are within normal limits Upper chest: Visualized lung apices are unremarkable. Other: None IMPRESSION: CT of the head: Findings consistent with acute intraventricular hemorrhage on the left as described. Chronic subdural collections similar to that seen on the prior exam. CT of the cervical spine: Degenerative changes without acute abnormality. Electronically Signed   By: Oneil Devonshire M.D.   On: 10/27/2023 20:17   CT Cervical Spine Wo Contrast Result Date: 10/27/2023 CLINICAL DATA:  Recent fall with headaches and neck pain, initial encounter EXAM: CT HEAD WITHOUT CONTRAST CT CERVICAL SPINE WITHOUT CONTRAST TECHNIQUE: Multidetector CT imaging of the head and  cervical spine was performed following the standard protocol without intravenous contrast. Multiplanar CT image reconstructions of the cervical spine were also generated. RADIATION DOSE REDUCTION: This exam was performed according to the departmental dose-optimization program which includes automated exposure control, adjustment of the mA and/or kV according to patient size and/or use of iterative reconstruction technique. COMPARISON:  01/20/2022 FINDINGS: CT HEAD FINDINGS Brain: Mild atrophic changes are noted. Chronic hypodense subdural collections are again seen bilaterally stable from the prior exam. A tiny amount of intraventricular blood is noted posteriorly in the left lateral ventricle. Additionally a small focus of hyperdensity is noted in the mid aspect of the left lateral ventricle best seen on coronal image number 37 of series 6. This likely represents an area  of adherent thrombus given the hemorrhage. No other focal area of hemorrhage is seen. Vascular: No hyperdense vessel or unexpected calcification. Skull: Burr holes are noted in the vertex similar to that seen on prior exam consistent with prior subdural drainage. Sinuses/Orbits: No acute finding. Other: Mild soft tissue swelling is noted in the left frontal region. CT CERVICAL SPINE FINDINGS Alignment: Within normal limits. Skull base and vertebrae: 7 cervical segments are well visualized. Vertebral body height is well maintained. Multilevel facet hypertrophic changes and osteophytic changes are seen. No acute fracture or acute facet abnormality is noted. The odontoid is within normal limits. 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 Soft tissues and spinal canal: Surrounding soft tissue structures are within normal limits Upper chest: Visualized lung apices are unremarkable. Other: None IMPRESSION: CT of the head: Findings consistent with acute intraventricular hemorrhage on the left as described. Chronic subdural collections similar to  that seen on the prior exam. CT of the cervical spine: Degenerative changes without acute abnormality. Electronically Signed   By: Oneil Devonshire M.D.   On: 10/27/2023 20:17   DG Chest 1 View Result Date: 10/27/2023 CLINICAL DATA:  Fall EXAM: CHEST  1 VIEW COMPARISON:  None available FINDINGS: Mild elevation of the right hemidiaphragm. Heart and mediastinal contours are within normal limits. No focal opacities or effusions. No acute bony abnormality. IMPRESSION: No active disease. Electronically Signed   By: Franky Crease M.D.   On: 10/27/2023 19:34   DG HIP UNILAT WITH PELVIS 2-3 VIEWS LEFT Result Date: 10/27/2023 CLINICAL DATA:  Fall EXAM: DG HIP (WITH OR WITHOUT PELVIS) 2-3V LEFT COMPARISON:  None Available. FINDINGS: Mild symmetric degenerative changes in the hips. Postoperative changes in the lower lumbar spine. No acute bony abnormality. Specifically, no fracture, subluxation, or dislocation. IMPRESSION: No acute bony abnormality. Electronically Signed   By: Franky Crease M.D.   On: 10/27/2023 19:33    EKG: Independently reviewed.  Sinus rhythm with first-degree AV block, no STEMI.  Assessment and Plan  Acute left-sided intraventricular hemorrhage Secondary to head injury from a mechanical fall.  Not on anticoagulation or antiplatelet agents. Neurosurgery recommended no further imaging or neurosurgical intervention and felt that it was okay to discharge the patient from their standpoint with follow-up PRN.  Will continue neurochecks every 4 hours for now until patient is in the hospital.  Left temporal forehead laceration Repaired with 3 staples by ED provider.  No active bleeding.  Tdap injection given.  Bradycardia Heart rate as low as high 40s in the ED but not hypotensive.  Heart rate now improved to the 80s.  EKG showing sinus rhythm with first-degree AV block.  Does not appear to be on any AV nodal blocking agents.  Dehydration BUN slightly elevated.  Gentle IV fluid hydration and  monitor labs.  Borderline hyperkalemia Etiology unclear.  Repeat labs to confirm.  Recent mechanical fall PT/OT eval, fall precautions.  Type 2 diabetes Last hemoglobin A1c 6.9 in August 2018, repeat ordered.  Placed on sensitive sliding scale insulin  ACHS.  Alzheimer's dementia: Delirium precautions. Hypertension Hyperlipidemia GERD Patient is unable to verify his home medications.  No family available at this time.  Pharmacy to call spouse in the morning to verify medications.  DVT prophylaxis: SCDs Code Status: Full Code (discussed with the patient) Level of care: Progressive Care Unit Admission status: It is my clinical opinion that referral for OBSERVATION is reasonable and necessary in this patient based on the above information provided. The aforementioned taken together  are felt to place the patient at high risk for further clinical deterioration. However, it is anticipated that the patient may be medically stable for discharge from the hospital within 24 to 48 hours.  Editha Ram MD [facility]  If 7PM-7AM, please contact night-coverage [email or web address]  10/28/2023, 12:52 AM

## 2023-10-28 NOTE — Discharge Summary (Signed)
 Physician Discharge Summary  Daniel [name] FMW:[id number] DOB: 11-25-1935 DOA: 10/27/2023  PCP: Clarice Nottingham, MD  Admit date: 10/27/2023 Discharge date: 10/28/2023    Admitted From: Home Disposition: Home  Recommendations for Outpatient Follow-up:  Follow up with PCP in 1-2 weeks Please obtain BMP/CBC in one week Please follow up with your PCP on the following pending results: Unresulted Labs (From admission, onward)    None       Home Health: Yes Equipment/Devices: None  Discharge Condition: Stable CODE STATUS: Full code Diet recommendation:  Diet Order             Diet heart healthy/carb modified Room service appropriate? Yes; Fluid consistency: Thin  Diet effective now                 Due to very brief hospitalization, I have copied admitting hospitalist HPI and ED course as below.  HPI: Daniel [name] is a 88 y.o. male with medical history significant of Alzheimer's dementia, type 2 diabetes, hypertension, hyperlipidemia, GERD presented to the ED for evaluation of head injury after a mechanical fall.  He was noted to have a laceration on the left side of his head along with left-sided rib pain, neck pain, and left hip tenderness.  Not on anticoagulation or antiplatelet agents.  Left temporal laceration was repaired with 3 staples.  Tdap injection given.  Patient noted to be bradycardic with heart rate as low as high 40s but not hypotensive.  No significant findings on CBC.  BMP noted potassium 5.2, glucose 135, BUN 25, creatinine 1.1 (previously 0.8-1.0 on labs done in 2019).  CT head showing findings consistent with acute intraventricular hemorrhage on the left.  Showing chronic subdural collections similar to that seen on prior exam.  CT C-spine negative for acute findings.  CT chest/abdomen/pelvis negative for acute findings.  Showing soft tissue density surrounding fatty tissue in the mid abdomen likely representing an area of fat necrosis for which no further  follow-up is recommended by radiologist.  X-ray of left hip/pelvis showing no acute bony abnormality.  Regarding intraventricular hemorrhage on brain imaging, neurosurgery recommended no further imaging or neurosurgical intervention and felt that it was okay to discharge the patient from their standpoint with follow-up PRN.   Patient has dementia and is able to give limited history.  He does not remember how he fell.  He is endorsing mild left-sided neck pain and left-sided rib pain.  Denies chest pressure or shortness of breath.  Subjective: Patient seen and examined, he was alert to self as well as place.  He was also able to tell me that he lives with his wife and was able to tell me her name as well.  He had no complaint.  Brief/Interim Summary: Details of hospitalization as below.  Acute left-sided intraventricular hemorrhage Secondary to head injury from a mechanical fall.  Not on anticoagulation or antiplatelet agents. Neurosurgery recommended no further imaging or neurosurgical intervention and felt that it was okay to discharge the patient from their standpoint with follow-up PRN.  However patient was still admitted under hospital service and apparently because the wife told the ED physician that she did not think that she would be able to take care of him at home.  There was no other medical necessity to admit this patient.  I have personally spoken to the wife, she was already aware of the recommendations from neurosurgery, she is now okay with taking him back.  Patient is being discharged back.  Left temporal forehead laceration Repaired with 3 staples by ED provider.  No active bleeding.  Tdap injection given.  Follow-up with PCP for removal of the staples.   Bradycardia Heart rate as low as high 40s in the ED but not hypotensive.  Heart rate now improved to the 80s.  EKG showing sinus rhythm with first-degree AV block.  Does not appear to be on any AV nodal blocking agents.    Dehydration Received hydration.   Borderline hyperkalemia Resolved.   Recent mechanical fall Still awaiting PT evaluation.  I think he will benefit from home health.  I have requested home health to be arranged for him.   Alzheimer's dementia: Delirium precautions. Hypertension Hyperlipidemia GERD Resume all home medications.  Late addendum: After discharge was completed, patient was seen by PT OT.  Patient was found to be requiring much more assistance than he normally does, PT recommended SNF.  Discharge was canceled.  Discharge plan was discussed with patient and/or family member and they verbalized understanding and agreed with it.  Discharge Diagnoses:  Principal Problem:   Intraventricular hemorrhage (HCC) Active Problems:   Forehead laceration   Bradycardia   Dehydration   Fall at home, initial encounter    Discharge Instructions   Allergies as of 10/28/2023       Reactions   Donepezil  Diarrhea   Tape Rash   PAPER TAPE IS PREFERRED, PLEASE        Medication List     STOP taking these medications    famotidine 40 MG tablet Commonly known as: PEPCID   naproxen sodium 220 MG tablet Commonly known as: ALEVE       TAKE these medications    atorvastatin  40 MG tablet Commonly known as: LIPITOR Take 40 mg by mouth daily at 6 (six) AM.   Calcium -Magnesium -Zinc -D3 Tabs Take 1 tablet by mouth daily.   calcium -vitamin D  500-200 MG-UNIT tablet Commonly known as: OSCAL WITH D Take 1 tablet by mouth daily with breakfast.   celecoxib 200 MG capsule Commonly known as: CELEBREX Take 200 mg by mouth daily.   Cholecalciferol  50 MCG (2000 UT) Caps Take 2,000 Units by mouth daily.   dicyclomine 20 MG tablet Commonly known as: BENTYL TK 1 T PO TID PRN   empagliflozin 10 MG Tabs tablet Commonly known as: JARDIANCE Take 1 tablet by mouth daily.   ezetimibe  10 MG tablet Commonly known as: ZETIA  Take 1 tablet (10 mg total) by mouth every evening.    memantine 5 MG tablet Commonly known as: NAMENDA Take 5 mg by mouth daily.   rivastigmine  9.5 mg/24hr Commonly known as: EXELON  APPLY 1 PATCH(9.5 MG) TOPICALLY TO THE SKIN DAILY   sitaGLIPtin 50 MG tablet Commonly known as: JANUVIA Take 50 mg by mouth daily.   tamsulosin  0.4 MG Caps capsule Commonly known as: FLOMAX  Take 0.4 mg by mouth at bedtime.        Follow-up Information     Clarice Nottingham, MD .   Specialty: Internal Medicine Why: As needed Contact information: [address] [place] [address] [place] [address] [phone]         [facility] Emergency Department at [facility] .   Specialty: Emergency Medicine Why: As needed Contact information: [address] [place]  [address] [phone]        Clarice Nottingham, MD Follow up in 1 week(s).   Specialty: Internal Medicine Contact information: [address] [place] [address] [place] [address] [phone]  Allergies  Allergen Reactions   Donepezil  Diarrhea   Tape Rash    PAPER TAPE IS PREFERRED, PLEASE    Consultations: Neurosurgery   Procedures/Studies: CT CHEST ABDOMEN PELVIS W CONTRAST Result Date: 10/27/2023 CLINICAL DATA:  Recent fall with chest and abdominal pain, initial encounter EXAM: CT CHEST, ABDOMEN, AND PELVIS WITH CONTRAST TECHNIQUE: Multidetector CT imaging of the chest, abdomen and pelvis was performed following the standard protocol during bolus administration of intravenous contrast. RADIATION DOSE REDUCTION: This exam was performed according to the departmental dose-optimization program which includes automated exposure control, adjustment of the mA and/or kV according to patient size and/or use of iterative reconstruction technique. CONTRAST:  100mL OMNIPAQUE IOHEXOL 300 MG/ML  SOLN COMPARISON:  Chest x-ray from earlier in the same day. FINDINGS: CT CHEST FINDINGS Cardiovascular: Atherosclerotic calcifications of the aorta  are noted. No aneurysmal dilatation or dissection is seen. Pulmonary artery as visualized is within normal limits although not timed for embolus evaluation. Coronary calcifications noted. Mediastinum/Nodes: Thoracic inlet is within normal limits. No hilar or mediastinal adenopathy is noted. The esophagus as visualized is within normal limits. Lungs/Pleura: The lungs are well aerated bilaterally. No focal infiltrate or sizable effusion is noted. Musculoskeletal: Degenerative changes of the thoracic spine are seen. No acute rib abnormality is noted. CT ABDOMEN PELVIS FINDINGS Hepatobiliary: Fatty infiltration of the liver is noted. The gallbladder is unremarkable. Pancreas: Unremarkable. No pancreatic ductal dilatation or surrounding inflammatory changes. Spleen: Normal in size without focal abnormality. Adrenals/Urinary Tract: Adrenal glands are within normal limits. Kidneys demonstrate a normal enhancement pattern. No renal calculi are seen. No obstructive changes are noted. The bladder is partially distended. Stomach/Bowel: Scattered fecal material is noted throughout the colon. The appendix is not well visualized. No inflammatory changes are identified to suggest appendicitis. Small bowel and stomach are within normal limits. Vascular/Lymphatic: Aortic atherosclerosis. No enlarged abdominal or pelvic lymph nodes. Reproductive: Prostate is unremarkable. Other: No abdominal wall hernia or abnormality. No abdominopelvic ascites. On image number 104 of series 2 there is a somewhat ovoid area of soft tissue surrounding central fatty attenuation. This likely represents an area of fatty necrosis. Musculoskeletal: Degenerative changes and postsurgical changes in the lumbar spine are noted. IMPRESSION: CT of the chest: No acute abnormality noted. CT of the abdomen and pelvis: No acute abnormality to correspond with the given clinical history. Soft tissue density surrounding fatty tissue in the mid abdomen likely  representing an area of fat necrosis. No further follow-up is recommended. Electronically Signed   By: Oneil Devonshire M.D.   On: 10/27/2023 23:42   CT Head Wo Contrast Result Date: 10/27/2023 CLINICAL DATA:  Recent fall with headaches and neck pain, initial encounter EXAM: CT HEAD WITHOUT CONTRAST CT CERVICAL SPINE WITHOUT CONTRAST TECHNIQUE: Multidetector CT imaging of the head and cervical spine was performed following the standard protocol without intravenous contrast. Multiplanar CT image reconstructions of the cervical spine were also generated. RADIATION DOSE REDUCTION: This exam was performed according to the departmental dose-optimization program which includes automated exposure control, adjustment of the mA and/or kV according to patient size and/or use of iterative reconstruction technique. COMPARISON:  01/20/2022 FINDINGS: CT HEAD FINDINGS Brain: Mild atrophic changes are noted. Chronic hypodense subdural collections are again seen bilaterally stable from the prior exam. A tiny amount of intraventricular blood is noted posteriorly in the left lateral ventricle. Additionally a small focus of hyperdensity is noted in the mid aspect of the left lateral ventricle best seen on coronal image number 37 of  series 6. This likely represents an area of adherent thrombus given the hemorrhage. No other focal area of hemorrhage is seen. Vascular: No hyperdense vessel or unexpected calcification. Skull: Burr holes are noted in the vertex similar to that seen on prior exam consistent with prior subdural drainage. Sinuses/Orbits: No acute finding. Other: Mild soft tissue swelling is noted in the left frontal region. CT CERVICAL SPINE FINDINGS Alignment: Within normal limits. Skull base and vertebrae: 7 cervical segments are well visualized. Vertebral body height is well maintained. Multilevel facet hypertrophic changes and osteophytic changes are seen. No acute fracture or acute facet abnormality is noted. The odontoid  is within normal limits. 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 Soft tissues and spinal canal: Surrounding soft tissue structures are within normal limits Upper chest: Visualized lung apices are unremarkable. Other: None IMPRESSION: CT of the head: Findings consistent with acute intraventricular hemorrhage on the left as described. Chronic subdural collections similar to that seen on the prior exam. CT of the cervical spine: Degenerative changes without acute abnormality. Electronically Signed   By: Oneil Devonshire M.D.   On: 10/27/2023 20:17   CT Cervical Spine Wo Contrast Result Date: 10/27/2023 CLINICAL DATA:  Recent fall with headaches and neck pain, initial encounter EXAM: CT HEAD WITHOUT CONTRAST CT CERVICAL SPINE WITHOUT CONTRAST TECHNIQUE: Multidetector CT imaging of the head and cervical spine was performed following the standard protocol without intravenous contrast. Multiplanar CT image reconstructions of the cervical spine were also generated. RADIATION DOSE REDUCTION: This exam was performed according to the departmental dose-optimization program which includes automated exposure control, adjustment of the mA and/or kV according to patient size and/or use of iterative reconstruction technique. COMPARISON:  01/20/2022 FINDINGS: CT HEAD FINDINGS Brain: Mild atrophic changes are noted. Chronic hypodense subdural collections are again seen bilaterally stable from the prior exam. A tiny amount of intraventricular blood is noted posteriorly in the left lateral ventricle. Additionally a small focus of hyperdensity is noted in the mid aspect of the left lateral ventricle best seen on coronal image number 37 of series 6. This likely represents an area of adherent thrombus given the hemorrhage. No other focal area of hemorrhage is seen. Vascular: No hyperdense vessel or unexpected calcification. Skull: Burr holes are noted in the vertex similar to that seen on prior exam consistent with prior  subdural drainage. Sinuses/Orbits: No acute finding. Other: Mild soft tissue swelling is noted in the left frontal region. CT CERVICAL SPINE FINDINGS Alignment: Within normal limits. Skull base and vertebrae: 7 cervical segments are well visualized. Vertebral body height is well maintained. Multilevel facet hypertrophic changes and osteophytic changes are seen. No acute fracture or acute facet abnormality is noted. The odontoid is within normal limits. 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 Soft tissues and spinal canal: Surrounding soft tissue structures are within normal limits Upper chest: Visualized lung apices are unremarkable. Other: None IMPRESSION: CT of the head: Findings consistent with acute intraventricular hemorrhage on the left as described. Chronic subdural collections similar to that seen on the prior exam. CT of the cervical spine: Degenerative changes without acute abnormality. Electronically Signed   By: Oneil Devonshire M.D.   On: 10/27/2023 20:17   DG Chest 1 View Result Date: 10/27/2023 CLINICAL DATA:  Fall EXAM: CHEST  1 VIEW COMPARISON:  None available FINDINGS: Mild elevation of the right hemidiaphragm. Heart and mediastinal contours are within normal limits. No focal opacities or effusions. No acute bony abnormality. IMPRESSION: No active disease. Electronically  Signed   By: Franky Crease M.D.   On: 10/27/2023 19:34   DG HIP UNILAT WITH PELVIS 2-3 VIEWS LEFT Result Date: 10/27/2023 CLINICAL DATA:  Fall EXAM: DG HIP (WITH OR WITHOUT PELVIS) 2-3V LEFT COMPARISON:  None Available. FINDINGS: Mild symmetric degenerative changes in the hips. Postoperative changes in the lower lumbar spine. No acute bony abnormality. Specifically, no fracture, subluxation, or dislocation. IMPRESSION: No acute bony abnormality. Electronically Signed   By: Franky Crease M.D.   On: 10/27/2023 19:33     Discharge Exam: Vitals:   10/28/23 0547 10/28/23 0936  BP: (!) 176/76 (!) 179/84  Pulse: 81 69   Resp: 18 13  Temp: 98.5 F (36.9 C) 98.5 F (36.9 C)  SpO2: 97% 100%   Vitals:   10/28/23 0121 10/28/23 0130 10/28/23 0547 10/28/23 0936  BP:  (!) 131/93 (!) 176/76 (!) 179/84  Pulse:  75 81 69  Resp:  16 18 13   Temp: 99.1 F (37.3 C) 97.6 F (36.4 C) 98.5 F (36.9 C) 98.5 F (36.9 C)  TempSrc: Oral Oral Oral Oral  SpO2:  99% 97% 100%    General: Pt is alert, awake, not in acute distress, has staples at the left temporal area. Cardiovascular: RRR, S1/S2 +, no rubs, no gallops Respiratory: CTA bilaterally, no wheezing, no rhonchi Abdominal: Soft, NT, ND, bowel sounds + Extremities: no edema, no cyanosis    The results of significant diagnostics from this hospitalization (including imaging, microbiology, ancillary and laboratory) are listed below for reference.     Microbiology: No results found for this or any previous visit (from the past 240 hours).   Labs: BNP (last 3 results) No results for input(s): BNP in the last 8760 hours. Basic Metabolic Panel: Recent Labs  Lab 10/27/23 1852 10/28/23 0356  NA 140 139  K 5.2* 4.8  CL 105 104  CO2 25 22  GLUCOSE 135* 170*  BUN 25* 26*  CREATININE 1.11 1.13  CALCIUM  9.8 9.8   Liver Function Tests: Recent Labs  Lab 10/28/23 0356  AST 26  ALT 16  ALKPHOS 81  BILITOT 0.7  PROT 7.3  ALBUMIN  4.4   No results for input(s): LIPASE, AMYLASE in the last 168 hours. No results for input(s): AMMONIA in the last 168 hours. CBC: Recent Labs  Lab 10/27/23 1852  WBC 10.5  NEUTROABS 8.9*  HGB 15.8  HCT 49.5  MCV 98.0  PLT 162   Cardiac Enzymes: No results for input(s): CKTOTAL, CKMB, CKMBINDEX, TROPONINI in the last 168 hours. BNP: Invalid input(s): POCBNP CBG: Recent Labs  Lab 10/28/23 0230 10/28/23 0815  GLUCAP 169* 158*   D-Dimer No results for input(s): DDIMER in the last 72 hours. Hgb A1c Recent Labs    10/28/23 0356  HGBA1C 6.8*   Lipid Profile No results for input(s):  CHOL, HDL, LDLCALC, TRIG, CHOLHDL, LDLDIRECT in the last 72 hours. Thyroid  function studies No results for input(s): TSH, T4TOTAL, T3FREE, THYROIDAB in the last 72 hours.  Invalid input(s): FREET3 Anemia work up No results for input(s): VITAMINB12, FOLATE, FERRITIN, TIBC, IRON, RETICCTPCT in the last 72 hours. Urinalysis    Component Value Date/Time   COLORURINE YELLOW 03/19/2017 0819   APPEARANCEUR CLEAR 03/19/2017 0819   LABSPEC 1.015 03/19/2017 0819   PHURINE 7.0 03/19/2017 0819   GLUCOSEU NEGATIVE 03/19/2017 0819   HGBUR NEGATIVE 03/19/2017 0819   BILIRUBINUR NEGATIVE 03/19/2017 0819   KETONESUR NEGATIVE 03/19/2017 0819   PROTEINUR NEGATIVE 03/19/2017 0819   UROBILINOGEN 1.0 01/01/2012  1433   NITRITE NEGATIVE 03/19/2017 0819   LEUKOCYTESUR NEGATIVE 03/19/2017 0819   Sepsis Labs Recent Labs  Lab 10/27/23 1852  WBC 10.5   Microbiology No results found for this or any previous visit (from the past 240 hours).  FURTHER DISCHARGE INSTRUCTIONS:   Get Medicines reviewed and adjusted: Please take all your medications with you for your next visit with your Primary MD   Laboratory/radiological data: Please request your Primary MD to go over all hospital tests and procedure/radiological results at the follow up, please ask your Primary MD to get all Hospital records sent to his/her office.   In some cases, they will be blood work, cultures and biopsy results pending at the time of your discharge. Please request that your primary care M.D. goes through all the records of your hospital data and follows up on these results.   Also Note the following: If you experience worsening of your admission symptoms, develop shortness of breath, life threatening emergency, suicidal or homicidal thoughts you must seek medical attention immediately by calling 911 or calling your MD immediately  if symptoms less severe.   You must read complete  instructions/literature along with all the possible adverse reactions/side effects for all the Medicines you take and that have been prescribed to you. Take any new Medicines after you have completely understood and accpet all the possible adverse reactions/side effects.    patient was instructed, not to drive, operate heavy machinery, perform activities at heights, swimming or participation in water activities or provide baby-sitting services while on Pain, Sleep and Anxiety Medications; until their outpatient Physician has advised to do so again. Also recommended to not to take more than prescribed Pain, Sleep and Anxiety Medications.  It is not advisable to combine anxiety, sleep and pain medications without talking with your primary care provider.     Wear Seat belts while driving.   Please note: You were cared for by a hospitalist during your hospital stay. Once you are discharged, your primary care physician will handle any further medical issues. Please note that NO REFILLS for any discharge medications will be authorized once you are discharged, as it is imperative that you return to your primary care physician (or establish a relationship with a primary care physician if you do not have one) for your post hospital discharge needs so that they can reassess your need for medications and monitor your lab values  Time coordinating discharge: Over 30 minutes  SIGNED:   Fredia Skeeter, MD  [facility] 10/28/2023, 10:34 AM *Please note that this is a verbal dictation therefore any spelling or grammatical errors are due to the Dragon [employer] One system interpretation. If 7PM-7AM, please contact night-coverage [email or web address]

## 2023-10-28 NOTE — Evaluation (Signed)
 Physical Therapy Evaluation Patient Details Name: RONDA KAZMI MRN: [id number] DOB: [birth year] Today's Date: 10/28/2023  History of Present Illness  Mr. Weekes is a 88 yr old male brought to the hospital after a fall. He was found to have a L temporal forehead laceration, dehydration, and acute L intraventricular hemorrhage. PMH: Alzheimer's dementia, DM II, HTN, HLD, GERD, back surgery  Clinical Impression  Pt admitted as above and presenting with functional mobility limitations 2* generalized weakness, significant ambulatory balance deficits, ongoing post fall pain, and decreased safety awareness.  Pt currently requiring significant assist for safe performance of basic mobility tasks and patient will benefit from continued inpatient follow up therapy, <3 hours/day to maximize IND and safety prior to dc home with ltd assist available.      If plan is discharge home, recommend the following: A lot of help with walking and/or transfers;A little help with bathing/dressing/bathroom;Assistance with cooking/housework;Assist for transportation;Help with stairs or ramp for entrance   Can travel by private vehicle   No    Equipment Recommendations None recommended by PT  Recommendations for Other Services       Functional Status Assessment Patient has had a recent decline in their functional status and demonstrates the ability to make significant improvements in function in a reasonable and predictable amount of time.     Precautions / Restrictions Precautions Precautions: Fall Restrictions Weight Bearing Restrictions Per Provider Order: No      Mobility  Bed Mobility Overal bed mobility: Needs Assistance Bed Mobility: Supine to Sit, Sit to Supine           General bed mobility comments: Assist to bring trunk to upright and to bring LEs into bed    Transfers Overall transfer level: Needs assistance Equipment used: None Transfers: Sit to/from Stand Sit to Stand: Min  assist, From elevated surface           General transfer comment: cues for safety and use of UEs to self assist    Ambulation/Gait Ambulation/Gait assistance: Min assist, Mod assist Gait Distance (Feet): 94 Feet Assistive device: Rolling walker (2 wheels), Straight cane Gait Pattern/deviations: Step-through pattern, Decreased step length - right, Decreased step length - left, Shuffle, Trunk flexed Gait velocity: decr     General Gait Details: 4' with SPC but requiring significant assist for balance; additional 84 ft with RW and min assist  Stairs            Wheelchair Mobility     Tilt Bed    Modified Rankin (Stroke Patients Only)       Balance Overall balance assessment: Needs assistance Sitting-balance support: Feet supported, No upper extremity supported Sitting balance-Leahy Scale: Fair     Standing balance support: No upper extremity supported, Reliant on assistive device for balance Standing balance-Leahy Scale: Poor                               Pertinent Vitals/Pain Pain Assessment Pain Assessment: Faces Faces Pain Scale: Hurts little more Pain Location: L upper back parascapular area    Home Living Family/patient expects to be discharged to:: Private residence Living Arrangements: Spouse/significant other Available Help at Discharge: Family;Available PRN/intermittently Type of Home: House Home Access: Stairs to enter Entrance Stairs-Rails: Right Entrance Stairs-Number of Steps: 2 Alternate Level Stairs-Number of Steps: 3 story home, pt's bedroom is up 6 stairs, no bathroom or bedroom on the main level of the home Home Layout: Multi-level Home  Equipment: Other (comment);Rolling Walker (2 wheels) (hurrycane) Additional Comments: Pt's spouse was present during part of the session and assisted with providing info regarding the pt's prior level of functioning & living situation.    Prior Function Prior Level of Function :  Independent/Modified Independent;Needs assist  Cognitive Assist : ADLs (cognitive);Mobility (cognitive)           Mobility Comments: He used a cane for ambulation. ADLs Comments: He was modified independent to independent with ADLs, his spouse performed the cooking, and they had hired assistance for cleaning 2 times a month. Someone also assisted them with transportation/running errands.     Extremity/Trunk Assessment   Upper Extremity Assessment Upper Extremity Assessment: LUE deficits/detail;RUE deficits/detail RUE Deficits / Details: AROM WFL. Grip strength WFL & grossly 4-/5. Arthritic changes of hand LUE Deficits / Details: Pain and discomfort with performing AROM for shoulder flexion. Elbow and hand AROM WFL. Grip strength grossly 4-/5. Arthritic changes of hand    Lower Extremity Assessment Lower Extremity Assessment: Generalized weakness    Cervical / Trunk Assessment Cervical / Trunk Assessment: Kyphotic  Communication   Communication Communication: Impaired Factors Affecting Communication: Hearing impaired    Cognition Arousal: Alert Behavior During Therapy: Impulsive                             Following commands: Impaired Following commands impaired: Only follows one step commands consistently     Cueing Cueing Techniques: Verbal cues, Gestural cues     General Comments      Exercises     Assessment/Plan    PT Assessment Patient needs continued PT services  PT Problem List Decreased strength;Decreased activity tolerance;Decreased balance;Decreased mobility;Decreased cognition;Decreased knowledge of use of DME;Pain;Decreased safety awareness       PT Treatment Interventions DME instruction;Gait training;Stair training;Functional mobility training;Therapeutic activities;Therapeutic exercise;Balance training;Patient/family education    PT Goals (Current goals can be found in the Care Plan section)  Acute Rehab PT Goals Patient Stated  Goal: Regain IND PT Goal Formulation: With patient/family Time For Goal Achievement: 11/11/23 Potential to Achieve Goals: Fair    Frequency Min 2X/week     Co-evaluation PT/OT/SLP Co-Evaluation/Treatment: Yes Reason for Co-Treatment: For patient/therapist safety;To address functional/ADL transfers PT goals addressed during session: Mobility/safety with mobility;Balance OT goals addressed during session: ADL's and self-care;Proper use of Adaptive equipment and DME       AM-PAC PT 6 Clicks Mobility  Outcome Measure Help needed turning from your back to your side while in a flat bed without using bedrails?: A Lot Help needed moving from lying on your back to sitting on the side of a flat bed without using bedrails?: A Lot Help needed moving to and from a bed to a chair (including a wheelchair)?: A Lot Help needed standing up from a chair using your arms (e.g., wheelchair or bedside chair)?: A Little Help needed to walk in hospital room?: A Little Help needed climbing 3-5 steps with a railing? : A Lot 6 Click Score: 14    End of Session Equipment Utilized During Treatment: Gait belt Activity Tolerance: Patient tolerated treatment well Patient left: in bed;with call bell/phone within reach;with bed alarm set;with family/visitor present Nurse Communication: Mobility status PT Visit Diagnosis: Unsteadiness on feet (R26.81);Muscle weakness (generalized) (M62.81);Difficulty in walking, not elsewhere classified (R26.2);Pain Pain - Right/Left: Left Pain - part of body:  (upper back)    Time: 1336-1410 PT Time Calculation (min) (ACUTE ONLY): 34 min  Charges:   PT Evaluation $PT Eval Low Complexity: 1 Low   PT General Charges $$ ACUTE PT VISIT: 1 Visit         [facility] PT Acute Rehabilitation Services Office [phone]   [facility] 10/28/2023, 5:08 PMPhysical Therapy Evaluation Patient Details Name: CHANANYA CANIZALEZ MRN: [id number] DOB: 1935-11-20 Today's Date:  10/28/2023  History of Present Illness  Mr. Hamman is a 88 yr old male brought to the hospital after a fall. He was found to have a L temporal forehead laceration, dehydration, and acute L intraventricular hemorrhage. PMH: Alzheimer's dementia, DM II, HTN, HLD, GERD, back surgery  Clinical Impression  Pt admitted as above and presenting with functional mobility limitations 2* generalized weakness, significant ambulatory balance deficits, ongoing post fall pain, and decreased safety awareness.  Pt currently requiring significant assist for safe performance of basic mobility tasks and patient will benefit from continued inpatient follow up therapy, <3 hours/day to maximize IND and safety prior to dc home with ltd assist available.       If plan is discharge home, recommend the following: A lot of help with walking and/or transfers;A little help with bathing/dressing/bathroom;Assistance with cooking/housework;Assist for transportation;Help with stairs or ramp for entrance   Can travel by private vehicle   No    Equipment Recommendations None recommended by PT  Recommendations for Other Services       Functional Status Assessment Patient has had a recent decline in their functional status and demonstrates the ability to make significant improvements in function in a reasonable and predictable amount of time.     Precautions / Restrictions Precautions Precautions: Fall Restrictions Weight Bearing Restrictions Per Provider Order: No      Mobility  Bed Mobility Overal bed mobility: Needs Assistance Bed Mobility: Supine to Sit, Sit to Supine           General bed mobility comments: Assist to bring trunk to upright and to bring LEs into bed    Transfers Overall transfer level: Needs assistance Equipment used: None Transfers: Sit to/from Stand Sit to Stand: Min assist, From elevated surface           General transfer comment: cues for safety and use of UEs to self assist     Ambulation/Gait Ambulation/Gait assistance: Min assist, Mod assist Gait Distance (Feet): 94 Feet Assistive device: Rolling walker (2 wheels), Straight cane Gait Pattern/deviations: Step-through pattern, Decreased step length - right, Decreased step length - left, Shuffle, Trunk flexed Gait velocity: decr     General Gait Details: 28' with SPC but requiring significant assist for balance; additional 84 ft with RW and min assist  Stairs            Wheelchair Mobility     Tilt Bed    Modified Rankin (Stroke Patients Only)       Balance Overall balance assessment: Needs assistance Sitting-balance support: Feet supported, No upper extremity supported Sitting balance-Leahy Scale: Fair     Standing balance support: No upper extremity supported, Reliant on assistive device for balance Standing balance-Leahy Scale: Poor                               Pertinent Vitals/Pain Pain Assessment Pain Assessment: Faces Faces Pain Scale: Hurts little more Pain Location: L upper back parascapular area    Home Living Family/patient expects to be discharged to:: Private residence Living Arrangements: Spouse/significant other Available Help at Discharge: Family;Available PRN/intermittently Type of  Home: House Home Access: Stairs to enter Entrance Stairs-Rails: Right Entrance Stairs-Number of Steps: 2 Alternate Level Stairs-Number of Steps: 3 story home, pt's bedroom is up 6 stairs, no bathroom or bedroom on the main level of the home Home Layout: Multi-level Home Equipment: Other (comment);Rolling Walker (2 wheels) (hurrycane) Additional Comments: Pt's spouse was present during part of the session and assisted with providing info regarding the pt's prior level of functioning & living situation.    Prior Function Prior Level of Function : Independent/Modified Independent;Needs assist  Cognitive Assist : ADLs (cognitive);Mobility (cognitive)           Mobility  Comments: He used a cane for ambulation. ADLs Comments: He was modified independent to independent with ADLs, his spouse performed the cooking, and they had hired assistance for cleaning 2 times a month. Someone also assisted them with transportation/running errands.     Extremity/Trunk Assessment   Upper Extremity Assessment Upper Extremity Assessment: LUE deficits/detail;RUE deficits/detail RUE Deficits / Details: AROM WFL. Grip strength WFL & grossly 4-/5. Arthritic changes of hand LUE Deficits / Details: Pain and discomfort with performing AROM for shoulder flexion. Elbow and hand AROM WFL. Grip strength grossly 4-/5. Arthritic changes of hand    Lower Extremity Assessment Lower Extremity Assessment: Generalized weakness    Cervical / Trunk Assessment Cervical / Trunk Assessment: Kyphotic  Communication   Communication Communication: Impaired Factors Affecting Communication: Hearing impaired    Cognition Arousal: Alert Behavior During Therapy: Impulsive                             Following commands: Impaired Following commands impaired: Only follows one step commands consistently     Cueing Cueing Techniques: Verbal cues, Gestural cues     General Comments      Exercises     Assessment/Plan    PT Assessment Patient needs continued PT services  PT Problem List Decreased strength;Decreased activity tolerance;Decreased balance;Decreased mobility;Decreased cognition;Decreased knowledge of use of DME;Pain;Decreased safety awareness       PT Treatment Interventions DME instruction;Gait training;Stair training;Functional mobility training;Therapeutic activities;Therapeutic exercise;Balance training;Patient/family education    PT Goals (Current goals can be found in the Care Plan section)  Acute Rehab PT Goals Patient Stated Goal: Regain IND PT Goal Formulation: With patient/family Time For Goal Achievement: 11/11/23 Potential to Achieve Goals: Fair     Frequency Min 2X/week     Co-evaluation PT/OT/SLP Co-Evaluation/Treatment: Yes Reason for Co-Treatment: For patient/therapist safety;To address functional/ADL transfers PT goals addressed during session: Mobility/safety with mobility;Balance OT goals addressed during session: ADL's and self-care;Proper use of Adaptive equipment and DME       AM-PAC PT 6 Clicks Mobility  Outcome Measure Help needed turning from your back to your side while in a flat bed without using bedrails?: A Lot Help needed moving from lying on your back to sitting on the side of a flat bed without using bedrails?: A Lot Help needed moving to and from a bed to a chair (including a wheelchair)?: A Lot Help needed standing up from a chair using your arms (e.g., wheelchair or bedside chair)?: A Little Help needed to walk in hospital room?: A Little Help needed climbing 3-5 steps with a railing? : A Lot 6 Click Score: 14    End of Session Equipment Utilized During Treatment: Gait belt Activity Tolerance: Patient tolerated treatment well Patient left: in bed;with call bell/phone within reach;with bed alarm set;with family/visitor present Nurse Communication: Mobility  status PT Visit Diagnosis: Unsteadiness on feet (R26.81);Muscle weakness (generalized) (M62.81);Difficulty in walking, not elsewhere classified (R26.2);Pain Pain - Right/Left: Left Pain - part of body:  (upper back)    Time: 1336-1410 PT Time Calculation (min) (ACUTE ONLY): 34 min   Charges:   PT Evaluation $PT Eval Low Complexity: 1 Low   PT General Charges $$ ACUTE PT VISIT: 1 Visit         [facility] PT Acute Rehabilitation Services Office [phone]   Cathie Bonnell 10/28/2023, 5:02 PM

## 2023-10-28 NOTE — ED Notes (Signed)
Changed pt brief and gown 

## 2023-10-28 NOTE — ED Notes (Signed)
 Pt pulled out his IV, pt displaying irrigated behavior. Fluids stopped currently

## 2023-10-28 NOTE — Evaluation (Signed)
 Occupational Therapy Evaluation Patient Details Name: Daniel [name] MRN: [id number] DOB: May 24, 1935 Today's Date: 10/28/2023   History of Present Illness   Mr. Daniel [name] is a 88 yr old male brought to the hospital after a fall. He was found to have a L temporal forehead laceration, dehydration, and acute L intraventricular hemorrhage. PMH: Alzheimer's dementia, DM II, HTN, HLD, GERD, back surgery     Clinical Impressions The pt is currently presenting well below his baseline level of functioning for self care management. He is limited by the below listed deficits (see OT problem list). As such, his occupational performance is compromised and he requires assistance for self care management. During the session, he required mod assist for supine to sit, mod assist for lower body dressing, and min assist to stand using a RW. On several instances, he reported pain of his L shoulder/scapula region (MD and nurse informed). Without further OT services, he is at risk for restricted ADL participation, progressive weakness, and additional falls. Patient will benefit from continued inpatient follow up therapy, <3 hours/day.      If plan is discharge home, recommend the following:   Assist for transportation;Assistance with cooking/housework;Help with stairs or ramp for entrance;A lot of help with bathing/dressing/bathroom;A lot of help with walking and/or transfers     Functional Status Assessment   Patient has had a recent decline in their functional status and demonstrates the ability to make significant improvements in function in a reasonable and predictable amount of time.     Equipment Recommendations   Other (comment) (defer to next setting)     Recommendations for Other Services         Precautions/Restrictions   Precautions Precautions: Fall     Mobility Bed Mobility Overal bed mobility: Needs Assistance Bed Mobility: Supine to Sit, Sit to Supine     Supine to sit:  Mod assist, HOB elevated Sit to supine: Mod assist        Transfers Overall transfer level: Needs assistance Equipment used: Rolling walker (2 wheels) Transfers: Sit to/from Stand Sit to Stand: Min assist, From elevated surface         Balance     Sitting balance-Leahy Scale: Fair         Standing balance comment: CGA to min assist with RW           ADL either performed or assessed with clinical judgement   ADL Overall ADL's : Needs assistance/impaired Eating/Feeding: Set up;Sitting   Grooming: Set up;Sitting;Supervision/safety   Upper Body Bathing: Set up;Supervision/ safety;Sitting   Lower Body Bathing: Moderate assistance;Sitting/lateral leans;Sit to/from stand   Upper Body Dressing : Minimal assistance;Sitting   Lower Body Dressing: Moderate assistance;Sitting/lateral leans   Toilet Transfer: Minimal assistance;Rolling walker (2 wheels);Ambulation;Grab bars;Cueing for safety   Toileting- Clothing Manipulation and Hygiene: Moderate assistance;Sit to/from stand;Cueing for safety;Cueing for sequencing Toileting - Clothing Manipulation Details (indicate cue type and reason): at bathroom level, based on clinical judgement             Vision Baseline Vision/History: 1 Wears glasses              Pertinent Vitals/Pain Pain Assessment Pain Assessment: Faces Pain Score: 4  Pain Location: L shoulder and scapula region Pain Intervention(s): Limited activity within patient's tolerance, Monitored during session, Other (comment) (MD and nurse informed via secure chat)     Extremity/Trunk Assessment Upper Extremity Assessment Upper Extremity Assessment: LUE deficits/detail;RUE deficits/detail RUE Deficits / Details: AROM WFL. Grip strength WFL &  grossly 4-/5. Arthritic changes of hand LUE Deficits / Details: Pain and discomfort with performing AROM for shoulder flexion. Elbow and hand AROM WFL. Grip strength grossly 4-/5. Arthritic changes of hand   Lower  Extremity Assessment Lower Extremity Assessment: Generalized weakness   Cervical / Trunk Assessment Cervical / Trunk Assessment: Kyphotic   Communication Communication Communication: Impaired Factors Affecting Communication: Hearing impaired   Cognition Arousal: Alert Behavior During Therapy: Impulsive Cognition: History of cognitive impairments, Cognition impaired   Orientation impairments: Place, Time Awareness: Online awareness impaired Memory impairment (select all impairments): Declarative long-term memory, Working [employer] fast streamer, Short-term memory Attention impairment (select first level of impairment): Divided attention Executive functioning impairment (select all impairments): Organization, Problem solving OT - Cognition Comments: Able to follow 1 step commands most of the time          Cueing  General Comments   Cueing Techniques: Verbal cues;Gestural cues;Tactile cues              Home Living Family/patient expects to be discharged to:: Skilled nursing facility Living Arrangements: Spouse/significant other Available Help at Discharge: Family;Available PRN/intermittently Type of Home: House Home Access: Stairs to enter Entrance Stairs-Number of Steps: 2 Entrance Stairs-Rails: Right Home Layout: Multi-level Alternate Level Stairs-Number of Steps: 3 story home, pt's bedroom is up 6 stairs, no bathroom or bedroom on the main level of the home Alternate Level Stairs-Rails: Left Bathroom Shower/Tub: Tub/shower unit   Bathroom Toilet: Handicapped height    Home Equipment: Other (comment);Rolling Walker (2 wheels) (hurrycane)   Additional Comments: Pt's spouse was present during part of the session and assisted with providing info regarding the pt's prior level of functioning & living situation.      Prior Functioning/Environment Prior Level of Function : Independent/Modified Independent;Needs assist  Cognitive Assist : ADLs (cognitive);Mobility (cognitive)            Mobility Comments: He used a cane for ambulation. ADLs Comments: He was modified independent to independent with ADLs, his spouse performed the cooking, and they had hired assistance for cleaning 2 times a month. Someone also assisted them with transportation/running errands.    OT Problem List: Decreased strength;Decreased range of motion;Decreased activity tolerance;Impaired balance (sitting and/or standing);Decreased cognition;Decreased safety awareness;Decreased knowledge of use of DME or AE;Pain   OT Treatment/Interventions: Self-care/ADL training;Therapeutic exercise;Therapeutic activities;Cognitive remediation/compensation;Energy conservation;DME and/or AE instruction;Patient/family education;Balance training      OT Goals(Current goals can be found in the care plan section)   Acute Rehab OT Goals OT Goal Formulation: With patient/family Time For Goal Achievement: 11/11/23 Potential to Achieve Goals: Good   OT Frequency:  Min 2X/week    Co-evaluation PT/OT/SLP Co-Evaluation/Treatment: Yes Reason for Co-Treatment: For patient/therapist safety;To address functional/ADL transfers PT goals addressed during session: Mobility/safety with mobility;Balance OT goals addressed during session: ADL's and self-care;Proper use of Adaptive equipment and DME      AM-PAC OT 6 Clicks Daily Activity     Outcome Measure Help from another person eating meals?: None Help from another person taking care of personal grooming?: A Little Help from another person toileting, which includes using toliet, bedpan, or urinal?: A Lot Help from another person bathing (including washing, rinsing, drying)?: A Lot Help from another person to put on and taking off regular upper body clothing?: A Little Help from another person to put on and taking off regular lower body clothing?: A Lot 6 Click Score: 16   End of Session Equipment Utilized During Treatment: Gait belt;Rolling walker (2 wheels) Nurse  Communication: Mobility  status;Other (comment) (L shoulder/scapula pain)  Activity Tolerance: Patient limited by pain Patient left: in bed;with call bell/phone within reach;with family/visitor present  OT Visit Diagnosis: Unsteadiness on feet (R26.81);Other abnormalities of gait and mobility (R26.89);History of falling (Z91.81);Muscle weakness (generalized) (M62.81);Pain;Other symptoms and signs involving cognitive function Pain - Right/Left: Left Pain - part of body: Shoulder                Time: 1336-1410 OT Time Calculation (min): 34 min Charges:  OT General Charges $OT Visit: 1 Visit OT Evaluation $OT Eval Moderate Complexity: 1 Mod    Darcee Dekker J Harris, OTR/L 10/28/2023, 5:11 PM

## 2023-10-29 ENCOUNTER — Observation Stay (HOSPITAL_COMMUNITY)

## 2023-10-29 DIAGNOSIS — I615 Nontraumatic intracerebral hemorrhage, intraventricular: Secondary | ICD-10-CM | POA: Diagnosis not present

## 2023-10-29 DIAGNOSIS — S2242XA Multiple fractures of ribs, left side, initial encounter for closed fracture: Secondary | ICD-10-CM | POA: Diagnosis not present

## 2023-10-29 DIAGNOSIS — S43002A Unspecified subluxation of left shoulder joint, initial encounter: Secondary | ICD-10-CM | POA: Diagnosis not present

## 2023-10-29 DIAGNOSIS — M19012 Primary osteoarthritis, left shoulder: Secondary | ICD-10-CM | POA: Diagnosis not present

## 2023-10-29 LAB — CBC WITH DIFFERENTIAL/PLATELET
Abs Immature Granulocytes: 0.04 K/uL (ref 0.00–0.07)
Basophils Absolute: 0 K/uL (ref 0.0–0.1)
Basophils Relative: 0 %
Eosinophils Absolute: 0 K/uL (ref 0.0–0.5)
Eosinophils Relative: 0 %
HCT: 48.3 % (ref 39.0–52.0)
Hemoglobin: 15.4 g/dL (ref 13.0–17.0)
Immature Granulocytes: 0 %
Lymphocytes Relative: 15 %
Lymphs Abs: 1.4 K/uL (ref 0.7–4.0)
MCH: 30.8 pg (ref 26.0–34.0)
MCHC: 31.9 g/dL (ref 30.0–36.0)
MCV: 96.6 fL (ref 80.0–100.0)
Monocytes Absolute: 0.6 K/uL (ref 0.1–1.0)
Monocytes Relative: 6 %
Neutro Abs: 7 K/uL (ref 1.7–7.7)
Neutrophils Relative %: 79 %
Platelets: 148 K/uL — ABNORMAL LOW (ref 150–400)
RBC: 5 MIL/uL (ref 4.22–5.81)
RDW: 12.5 % (ref 11.5–15.5)
WBC: 9.1 K/uL (ref 4.0–10.5)
nRBC: 0 % (ref 0.0–0.2)

## 2023-10-29 LAB — BASIC METABOLIC PANEL WITH GFR
Anion gap: 11 (ref 5–15)
BUN: 28 mg/dL — ABNORMAL HIGH (ref 8–23)
CO2: 25 mmol/L (ref 22–32)
Calcium: 9.8 mg/dL (ref 8.9–10.3)
Chloride: 102 mmol/L (ref 98–111)
Creatinine, Ser: 1.15 mg/dL (ref 0.61–1.24)
GFR, Estimated: >60 mL/min (ref >60)
Glucose, Bld: 131 mg/dL — ABNORMAL HIGH (ref 70–99)
Potassium: 4 mmol/L (ref 3.5–5.1)
Sodium: 138 mmol/L (ref 135–145)

## 2023-10-29 LAB — GLUCOSE, CAPILLARY
Glucose-Capillary: 150 mg/dL — ABNORMAL HIGH (ref 70–99)
Glucose-Capillary: 189 mg/dL — ABNORMAL HIGH (ref 70–99)
Glucose-Capillary: 139 mg/dL — ABNORMAL HIGH (ref 70–99)
Glucose-Capillary: 190 mg/dL — ABNORMAL HIGH (ref 70–99)

## 2023-10-29 MED ORDER — HALOPERIDOL LACTATE 5 MG/ML IJ SOLN: 1.0000 mg | Freq: Once | INTRAMUSCULAR | Status: DC

## 2023-10-29 MED ORDER — HALOPERIDOL LACTATE 5 MG/ML IJ SOLN
1.0000 mg | Freq: Once | INTRAMUSCULAR | Status: AC
  Administered 2023-10-29: 1 mg via INTRAMUSCULAR
  Filled 2023-10-29: qty 1

## 2023-10-29 NOTE — Plan of Care (Signed)
  Problem: Education: Goal: Ability to describe self-care measures that may prevent or decrease complications (Diabetes Survival Skills Education) will improve Outcome: Progressing   Problem: Skin Integrity: Goal: Risk for impaired skin integrity will decrease Outcome: Progressing   Problem: Clinical Measurements: Goal: Will remain free from infection Outcome: Progressing   Problem: Activity: Goal: Risk for activity intolerance will decrease Outcome: Progressing   Problem: Pain Managment: Goal: General experience of comfort will improve and/or be controlled Outcome: Progressing

## 2023-10-29 NOTE — Progress Notes (Signed)
 PROGRESS NOTE    Daniel [name]  FMW:[id number] DOB: Dec 25, 1935 DOA: 10/27/2023 PCP: Clarice Nottingham, MD   Brief Narrative:  HPI: Daniel [name] is a 88 y.o. male with medical history significant of Alzheimer's dementia, type 2 diabetes, hypertension, hyperlipidemia, GERD presented to the ED for evaluation of head injury after a mechanical fall.  He was noted to have a laceration on the left side of his head along with left-sided rib pain, neck pain, and left hip tenderness.  Not on anticoagulation or antiplatelet agents.  Left temporal laceration was repaired with 3 staples.  Tdap injection given.  Patient noted to be bradycardic with heart rate as low as high 40s but not hypotensive.  No significant findings on CBC.  BMP noted potassium 5.2, glucose 135, BUN 25, creatinine 1.1 (previously 0.8-1.0 on labs done in 2019).  CT head showing findings consistent with acute intraventricular hemorrhage on the left.  Showing chronic subdural collections similar to that seen on prior exam.  CT C-spine negative for acute findings.  CT chest/abdomen/pelvis negative for acute findings.  Showing soft tissue density surrounding fatty tissue in the mid abdomen likely representing an area of fat necrosis for which no further follow-up is recommended by radiologist.  X-ray of left hip/pelvis showing no acute bony abnormality.  Regarding intraventricular hemorrhage on brain imaging, neurosurgery recommended no further imaging or neurosurgical intervention and felt that it was okay to discharge the patient from their standpoint with follow-up PRN.   Patient has dementia and is able to give limited history.  He does not remember how he fell.  He is endorsing mild left-sided neck pain and left-sided rib pain.  Denies chest pressure or shortness of breath.    Assessment & Plan:   Principal Problem:   Intraventricular hemorrhage (HCC) Active Problems:   Forehead laceration   Bradycardia   Dehydration   Fall at home,  initial encounter  Acute left-sided intraventricular hemorrhage Secondary to head injury from a mechanical fall.  Not on anticoagulation or antiplatelet agents. Neurosurgery recommended no further imaging or neurosurgical intervention and felt that it was okay to discharge the patient from their standpoint with follow-up PRN.  However patient was still admitted under hospital service and apparently because the wife told the ED physician that she did not think that she would be able to take care of him at home.  There was no other medical necessity to admit this patient.  He is alert and oriented.   Left temporal forehead laceration Repaired with 3 staples by ED provider.  No active bleeding.  Tdap injection given.  Follow-up with PCP for removal of the staples.   Bradycardia Heart rate as low as high 40s in the ED but not hypotensive.  Heart rate now improved to the 80s.  EKG showing sinus rhythm with first-degree AV block.  Does not appear to be on any AV nodal blocking agents.   Dehydration Received hydration.   Borderline hyperkalemia Resolved.   Recent mechanical fall Seen by PT OT, they recommended SNF.  Wife also said that she cannot take care of him at home.  Awaiting SNF placement, TOC on board.   Alzheimer's dementia: Delirium precautions. Hypertension Hyperlipidemia GERD Resumed all home medications.  Left shoulder pain: X-ray obtained today which shows no acute fracture or dislocation of the left shoulder, has chronic rotator cuff arthropathy.  DVT prophylaxis: SCDs Start: 10/28/23 0157   Code Status: Full Code  Family Communication:  None present at bedside.  Plan  of care discussed with patient in length and he/she verbalized understanding and agreed with it.  Status is: Observation The patient will require care spanning > 2 midnights and should be moved to inpatient because: Medically stable, pending placement.   Estimated body mass index is 20.48 kg/m as calculated  from the following:   Height as of this encounter: 6' (1.829 m).   Weight as of this encounter: 68.5 kg.    Nutritional Assessment: Body mass index is 20.48 kg/m.Daniel [name] Seen by dietician.  I agree with the assessment and plan as outlined below: Nutrition Status:        . Skin Assessment: I have examined the patient's skin and I agree with the wound assessment as performed by the wound care RN as outlined below:    Consultants:  Neurosurgery  Procedures:  None  Antimicrobials:  Anti-infectives (From admission, onward)    None         Subjective: Seen and examined, nurse at the bedside.  Patient alert and oriented to self, place as well as month and the year.  He was complaining of left shoulder pain.  Objective: Vitals:   10/28/23 2007 10/29/23 0300 10/29/23 0625 10/29/23 0709  BP: (!) 154/63  (!) 188/77 (!) 174/79  Pulse: (!) 55  67 (!) 57  Resp: 19  17   Temp: 98 F (36.7 C)  (!) 97.3 F (36.3 C)   TempSrc: Oral  Oral   SpO2: 99%  99%   Weight:  68.5 kg    Height:  6' (1.829 m)      Intake/Output Summary (Last 24 hours) at 10/29/2023 1202 Last data filed at 10/29/2023 0900 Gross per 24 hour  Intake 1110 ml  Output 200 ml  Net 910 ml   Filed Weights   10/29/23 0300  Weight: 68.5 kg    Examination:  General exam: Appears calm and comfortable  Respiratory system: Clear to auscultation. Respiratory effort normal. Cardiovascular system: S1 & S2 heard, RRR. No JVD, murmurs, rubs, gallops or clicks. No pedal edema. Gastrointestinal system: Abdomen is nondistended, soft and nontender. No organomegaly or masses felt. Normal bowel sounds heard. Central nervous system: Alert and oriented. No focal neurological deficits. Extremities: Symmetric 5 x 5 power. Skin: No rashes, lesions or ulcers  Data Reviewed: I have personally reviewed following labs and imaging studies  CBC: Recent Labs  Lab 10/27/23 1852 10/29/23 0846  WBC 10.5 9.1  NEUTROABS 8.9* 7.0   HGB 15.8 15.4  HCT 49.5 48.3  MCV 98.0 96.6  PLT 162 148*   Basic Metabolic Panel: Recent Labs  Lab 10/27/23 1852 10/28/23 0356 10/29/23 0846  NA 140 139 138  K 5.2* 4.8 4.0  CL 105 104 102  CO2 25 22 25   GLUCOSE 135* 170* 131*  BUN 25* 26* 28*  CREATININE 1.11 1.13 1.15  CALCIUM  9.8 9.8 9.8   GFR: Estimated Creatinine Clearance: 44.7 mL/min (by C-G formula based on SCr of 1.15 mg/dL). Liver Function Tests: Recent Labs  Lab 10/28/23 0356  AST 26  ALT 16  ALKPHOS 81  BILITOT 0.7  PROT 7.3  ALBUMIN  4.4   No results for input(s): LIPASE, AMYLASE in the last 168 hours. No results for input(s): AMMONIA in the last 168 hours. Coagulation Profile: No results for input(s): INR, PROTIME in the last 168 hours. Cardiac Enzymes: No results for input(s): CKTOTAL, CKMB, CKMBINDEX, TROPONINI in the last 168 hours. BNP (last 3 results) No results for input(s): PROBNP in the last 8760  hours. HbA1C: Recent Labs    10/28/23 0356  HGBA1C 6.8*   CBG: Recent Labs  Lab 10/28/23 1200 10/28/23 1709 10/28/23 2016 10/29/23 0748 10/29/23 1117  GLUCAP 182* 187* 191* 150* 189*   Lipid Profile: No results for input(s): CHOL, HDL, LDLCALC, TRIG, CHOLHDL, LDLDIRECT in the last 72 hours. Thyroid  Function Tests: No results for input(s): TSH, T4TOTAL, FREET4, T3FREE, THYROIDAB in the last 72 hours. Anemia Panel: No results for input(s): VITAMINB12, FOLATE, FERRITIN, TIBC, IRON, RETICCTPCT in the last 72 hours. Sepsis Labs: No results for input(s): PROCALCITON, LATICACIDVEN in the last 168 hours.  No results found for this or any previous visit (from the past 240 hours).   Radiology Studies: DG Shoulder Left Result Date: 10/29/2023 CLINICAL DATA:  Shoulder pain, fall. EXAM: LEFT SHOULDER - 2+ VIEW COMPARISON:  Included portions from chest CT 10/27/2023 FINDINGS: There is no evidence of fracture or dislocation. Mild  acromioclavicular degenerative change. Superior subluxation of the humeral head consistent with chronic rotator cuff arthropathy. Soft tissues are unremarkable. Review of recent chest CT demonstrates nondisplaced fractures of left anterior second and third ribs, likely acute. IMPRESSION: 1. No acute fracture or dislocation of the left shoulder. 2. Superior subluxation of the humeral head typical of chronic rotator cuff arthropathy. 3. Review of recent chest CT demonstrates nondisplaced fractures of left anterior second and third ribs, likely acute. Electronically Signed   By: Andrea Gasman M.D.   On: 10/29/2023 10:53   CT CHEST ABDOMEN PELVIS W CONTRAST Result Date: 10/27/2023 CLINICAL DATA:  Recent fall with chest and abdominal pain, initial encounter EXAM: CT CHEST, ABDOMEN, AND PELVIS WITH CONTRAST TECHNIQUE: Multidetector CT imaging of the chest, abdomen and pelvis was performed following the standard protocol during bolus administration of intravenous contrast. RADIATION DOSE REDUCTION: This exam was performed according to the departmental dose-optimization program which includes automated exposure control, adjustment of the mA and/or kV according to patient size and/or use of iterative reconstruction technique. CONTRAST:  100mL OMNIPAQUE IOHEXOL 300 MG/ML  SOLN COMPARISON:  Chest x-ray from earlier in the same day. FINDINGS: CT CHEST FINDINGS Cardiovascular: Atherosclerotic calcifications of the aorta are noted. No aneurysmal dilatation or dissection is seen. Pulmonary artery as visualized is within normal limits although not timed for embolus evaluation. Coronary calcifications noted. Mediastinum/Nodes: Thoracic inlet is within normal limits. No hilar or mediastinal adenopathy is noted. The esophagus as visualized is within normal limits. Lungs/Pleura: The lungs are well aerated bilaterally. No focal infiltrate or sizable effusion is noted. Musculoskeletal: Degenerative changes of the thoracic spine  are seen. No acute rib abnormality is noted. CT ABDOMEN PELVIS FINDINGS Hepatobiliary: Fatty infiltration of the liver is noted. The gallbladder is unremarkable. Pancreas: Unremarkable. No pancreatic ductal dilatation or surrounding inflammatory changes. Spleen: Normal in size without focal abnormality. Adrenals/Urinary Tract: Adrenal glands are within normal limits. Kidneys demonstrate a normal enhancement pattern. No renal calculi are seen. No obstructive changes are noted. The bladder is partially distended. Stomach/Bowel: Scattered fecal material is noted throughout the colon. The appendix is not well visualized. No inflammatory changes are identified to suggest appendicitis. Small bowel and stomach are within normal limits. Vascular/Lymphatic: Aortic atherosclerosis. No enlarged abdominal or pelvic lymph nodes. Reproductive: Prostate is unremarkable. Other: No abdominal wall hernia or abnormality. No abdominopelvic ascites. On image number 104 of series 2 there is a somewhat ovoid area of soft tissue surrounding central fatty attenuation. This likely represents an area of fatty necrosis. Musculoskeletal: Degenerative changes and postsurgical changes in the lumbar spine  are noted. IMPRESSION: CT of the chest: No acute abnormality noted. CT of the abdomen and pelvis: No acute abnormality to correspond with the given clinical history. Soft tissue density surrounding fatty tissue in the mid abdomen likely representing an area of fat necrosis. No further follow-up is recommended. Electronically Signed   By: Oneil Devonshire M.D.   On: 10/27/2023 23:42   CT Head Wo Contrast Result Date: 10/27/2023 CLINICAL DATA:  Recent fall with headaches and neck pain, initial encounter EXAM: CT HEAD WITHOUT CONTRAST CT CERVICAL SPINE WITHOUT CONTRAST TECHNIQUE: Multidetector CT imaging of the head and cervical spine was performed following the standard protocol without intravenous contrast. Multiplanar CT image reconstructions of  the cervical spine were also generated. RADIATION DOSE REDUCTION: This exam was performed according to the departmental dose-optimization program which includes automated exposure control, adjustment of the mA and/or kV according to patient size and/or use of iterative reconstruction technique. COMPARISON:  01/20/2022 FINDINGS: CT HEAD FINDINGS Brain: Mild atrophic changes are noted. Chronic hypodense subdural collections are again seen bilaterally stable from the prior exam. A tiny amount of intraventricular blood is noted posteriorly in the left lateral ventricle. Additionally a small focus of hyperdensity is noted in the mid aspect of the left lateral ventricle best seen on coronal image number 37 of series 6. This likely represents an area of adherent thrombus given the hemorrhage. No other focal area of hemorrhage is seen. Vascular: No hyperdense vessel or unexpected calcification. Skull: Burr holes are noted in the vertex similar to that seen on prior exam consistent with prior subdural drainage. Sinuses/Orbits: No acute finding. Other: Mild soft tissue swelling is noted in the left frontal region. CT CERVICAL SPINE FINDINGS Alignment: Within normal limits. Skull base and vertebrae: 7 cervical segments are well visualized. Vertebral body height is well maintained. Multilevel facet hypertrophic changes and osteophytic changes are seen. No acute fracture or acute facet abnormality is noted. The odontoid is within normal limits. 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 Soft tissues and spinal canal: Surrounding soft tissue structures are within normal limits Upper chest: Visualized lung apices are unremarkable. Other: None IMPRESSION: CT of the head: Findings consistent with acute intraventricular hemorrhage on the left as described. Chronic subdural collections similar to that seen on the prior exam. CT of the cervical spine: Degenerative changes without acute abnormality. Electronically Signed   By:  Oneil Devonshire M.D.   On: 10/27/2023 20:17   CT Cervical Spine Wo Contrast Result Date: 10/27/2023 CLINICAL DATA:  Recent fall with headaches and neck pain, initial encounter EXAM: CT HEAD WITHOUT CONTRAST CT CERVICAL SPINE WITHOUT CONTRAST TECHNIQUE: Multidetector CT imaging of the head and cervical spine was performed following the standard protocol without intravenous contrast. Multiplanar CT image reconstructions of the cervical spine were also generated. RADIATION DOSE REDUCTION: This exam was performed according to the departmental dose-optimization program which includes automated exposure control, adjustment of the mA and/or kV according to patient size and/or use of iterative reconstruction technique. COMPARISON:  01/20/2022 FINDINGS: CT HEAD FINDINGS Brain: Mild atrophic changes are noted. Chronic hypodense subdural collections are again seen bilaterally stable from the prior exam. A tiny amount of intraventricular blood is noted posteriorly in the left lateral ventricle. Additionally a small focus of hyperdensity is noted in the mid aspect of the left lateral ventricle best seen on coronal image number 37 of series 6. This likely represents an area of adherent thrombus given the hemorrhage. No other focal area of hemorrhage is seen.  Vascular: No hyperdense vessel or unexpected calcification. Skull: Burr holes are noted in the vertex similar to that seen on prior exam consistent with prior subdural drainage. Sinuses/Orbits: No acute finding. Other: Mild soft tissue swelling is noted in the left frontal region. CT CERVICAL SPINE FINDINGS Alignment: Within normal limits. Skull base and vertebrae: 7 cervical segments are well visualized. Vertebral body height is well maintained. Multilevel facet hypertrophic changes and osteophytic changes are seen. No acute fracture or acute facet abnormality is noted. The odontoid is within normal limits. 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 Soft tissues and  spinal canal: Surrounding soft tissue structures are within normal limits Upper chest: Visualized lung apices are unremarkable. Other: None IMPRESSION: CT of the head: Findings consistent with acute intraventricular hemorrhage on the left as described. Chronic subdural collections similar to that seen on the prior exam. CT of the cervical spine: Degenerative changes without acute abnormality. Electronically Signed   By: Oneil Devonshire M.D.   On: 10/27/2023 20:17   DG Chest 1 View Result Date: 10/27/2023 CLINICAL DATA:  Fall EXAM: CHEST  1 VIEW COMPARISON:  None available FINDINGS: Mild elevation of the right hemidiaphragm. Heart and mediastinal contours are within normal limits. No focal opacities or effusions. No acute bony abnormality. IMPRESSION: No active disease. Electronically Signed   By: Franky Crease M.D.   On: 10/27/2023 19:34   DG HIP UNILAT WITH PELVIS 2-3 VIEWS LEFT Result Date: 10/27/2023 CLINICAL DATA:  Fall EXAM: DG HIP (WITH OR WITHOUT PELVIS) 2-3V LEFT COMPARISON:  None Available. FINDINGS: Mild symmetric degenerative changes in the hips. Postoperative changes in the lower lumbar spine. No acute bony abnormality. Specifically, no fracture, subluxation, or dislocation. IMPRESSION: No acute bony abnormality. Electronically Signed   By: Franky Crease M.D.   On: 10/27/2023 19:33    Scheduled Meds:  Influenza vac split trivalent PF  0.5 mL Intramuscular Tomorrow-1000   insulin  aspart  0-5 Units Subcutaneous QHS   insulin  aspart  0-9 Units Subcutaneous TID WC   lidocaine  (PF)  5 mL Infiltration Once   Continuous Infusions:   LOS: 0 days   Fredia Skeeter, MD [facility]  10/29/2023, 12:02 PM   *Please note that this is a verbal dictation therefore any spelling or grammatical errors are due to the Dragon Medical One system interpretation.  Please page via Amion and do not message via secure chat for urgent patient care matters. Secure chat can be used for non urgent patient  care matters.  How to contact the [facility] Attending or Consulting provider 7A - 7P or covering provider during after hours 7P -7A, for this patient?  Check the care team in [facility] and look for a) attending/consulting [facility] provider listed and b) the [facility] team listed. Page or secure chat 7A-7P. Log into [email or web address] and use [facility] universal password to access. If you do not have the password, please contact the hospital operator. Locate the [facility] provider you are looking for under [facility] and page to a number that you can be directly reached. If you still have difficulty reaching the provider, please page the [facility] (Director on Call) for the Hospitalists listed on amion for assistance.

## 2023-10-29 NOTE — TOC Initial Note (Signed)
 Transition of Care [facility]) - Initial/Assessment Note   Patient Details  Name: Daniel [name] MRN: [id number] Date of Birth: 03-23-1935  [facility]) CM/SW Contact:    Duwaine GORMAN Aran, LCSW Phone Number: 10/29/2023, 8:55 AM  Clinical Narrative: PT/OT evaluations recommended SNF and wife is agreeable. Wife requested [facility] as first choice. FL2 done; PASRR confirmed. Initial referral faxed out. CSW called [facility] and spoke with Jori to start insurance authorization for SNF and ambulance transfer (PTAR). Care management awaiting bed offers and insurance authorization.  Expected Discharge Plan: Skilled Nursing Facility Barriers to Discharge: SNF Pending bed offer, Insurance Authorization  Patient Goals and CMS Choice Patient states their goals for this hospitalization and ongoing recovery are:: Go to [facility] for rehab CMS Medicare.gov Compare Post Acute Care list provided to:: Patient Represenative (must comment) Choice offered to / list presented to : Spouse  Expected Discharge Plan and Services In-house Referral: Clinical Social Work Post Acute Care Choice: Skilled Nursing Facility Living arrangements for the past 2 months: Single Family Home Expected Discharge Date: 10/28/23               DME Arranged: N/A DME Agency: NA  Prior Living Arrangements/Services Living arrangements for the past 2 months: Single Family Home Lives with:: Spouse Patient language and need for interpreter reviewed:: Yes Do you feel safe going back to the place where you live?: Yes      Need for Family Participation in Patient Care: Yes (Comment) (Patient is oriented x2.) Care giver support system in place?: Yes (comment) Criminal Activity/Legal Involvement Pertinent to Current Situation/Hospitalization: No - Comment as needed  Activities of Daily Living ADL Screening (condition at time of admission) Independently performs ADLs?: Yes (appropriate for developmental age) Is the patient deaf or  have difficulty hearing?: Yes Does the patient have difficulty seeing, even when wearing glasses/contacts?: No Does the patient have difficulty concentrating, remembering, or making decisions?: Yes  Permission Sought/Granted Permission sought to share information with : Facility Industrial/product designer granted to share information with : Yes, Verbal Permission Granted Permission granted to share info w AGENCY: SNFs  Emotional Assessment Orientation: : Oriented to Self, Oriented to Place Alcohol / Substance Use: Not Applicable Psych Involvement: No (comment)  Admission diagnosis:  Intracranial hemorrhage (HCC) [I62.9] Intraventricular hemorrhage (HCC) [I61.5] Fall, initial encounter [W19.XXXA] Patient Active Problem List   Diagnosis Date Noted   Intraventricular hemorrhage (HCC) 10/28/2023   Forehead laceration 10/28/2023   Bradycardia 10/28/2023   Dehydration 10/28/2023   Fall at home, initial encounter 10/28/2023   Late onset Alzheimer's disease without behavioral disturbance (HCC) 03/04/2020   History of subdural hematoma 11/27/2018   Labile blood pressure    Dementia without behavioral disturbance (HCC)    Abnormal BUN-to-creatinine ratio    Urinary incontinence    Labile blood glucose    Type 2 diabetes mellitus with peripheral neuropathy (HCC)    Subdural hematoma (HCC) 03/13/2017   Encephalopathy    Seizure prophylaxis    Hyperlipidemia    Slow transit constipation    Steroid-induced hyperglycemia    Diabetes mellitus type 2 in nonobese (HCC)    Tachypnea    Malnutrition of moderate degree 03/08/2017   Subdural hygroma 03/06/2017   Urinary frequency 11/16/2016   Postprocedural hypotension    Dural tear    Alzheimer's dementia without behavioral disturbance (HCC)    Chronic back pain    Post-operative pain    Benign essential HTN    Leukocytosis    Acute  blood loss anemia    Thrombocytopenia    Spondylolisthesis of lumbar region 09/12/2016    Essential hypertension 09/12/2016   Surgery, elective    Diarrhea 06/08/2016   Memory loss 03/09/2016   Essential tremor 03/09/2016   Diabetic neuropathy (HCC) 03/09/2016   Vitamin D  deficiency 03/09/2016   PCP:  Clarice Nottingham, MD Pharmacy:   [facility] [address], [place] - [address] AT [facility] OF [address] [place] [address] Phone: [phone] Fax: [phone]  Social Drivers of Health (SDOH) Social History: SDOH Screenings   Food Insecurity: No Food Insecurity (10/28/2023)  Housing: Low Risk  (10/28/2023)  Transportation Needs: No Transportation Needs (10/28/2023)  Utilities: Not At Risk (10/28/2023)  Social Connections: Socially Integrated (10/28/2023)  Tobacco Use: Medium Risk (10/27/2023)   SDOH Interventions:    Readmission Risk Interventions     No data to display

## 2023-10-29 NOTE — [place] FL2 (Signed)
 [place]  MEDICAID FL2 LEVEL OF CARE FORM     IDENTIFICATION  Patient Name: Daniel [name] Birthdate: 08/30/1935 Sex: male Admission Date (Current Location): 10/27/2023  [facility] and [place] Number:  Producer, television/film/video and Address:  [facility],  [address], [place] [address]      Provider Number: [id number]  Attending Physician Name and Address:  Vernon Ranks, MD  Relative Name and Phone Number:  Arnie Clingenpeel (spouse) Ph: [phone]    Current Level of Care: Hospital Recommended Level of Care: Skilled Nursing Facility Prior Approval Number:    Date Approved/Denied:   PASRR Number: [id number]  Discharge Plan: SNF    Current Diagnoses: Patient Active Problem List   Diagnosis Date Noted   Intraventricular hemorrhage (HCC) 10/28/2023   Forehead laceration 10/28/2023   Bradycardia 10/28/2023   Dehydration 10/28/2023   Fall at home, initial encounter 10/28/2023   Late onset Alzheimer's disease without behavioral disturbance (HCC) 03/04/2020   History of subdural hematoma 11/27/2018   Labile blood pressure    Dementia without behavioral disturbance (HCC)    Abnormal BUN-to-creatinine ratio    Urinary incontinence    Labile blood glucose    Type 2 diabetes mellitus with peripheral neuropathy (HCC)    Subdural hematoma (HCC) 03/13/2017   Encephalopathy    Seizure prophylaxis    Hyperlipidemia    Slow transit constipation    Steroid-induced hyperglycemia    Diabetes mellitus type 2 in nonobese (HCC)    Tachypnea    Malnutrition of moderate degree 03/08/2017   Subdural hygroma 03/06/2017   Urinary frequency 11/16/2016   Postprocedural hypotension    Dural tear    Alzheimer's dementia without behavioral disturbance (HCC)    Chronic back pain    Post-operative pain    Benign essential HTN    Leukocytosis    Acute blood loss anemia    Thrombocytopenia    Spondylolisthesis of lumbar region 09/12/2016   Essential hypertension 09/12/2016    Surgery, elective    Diarrhea 06/08/2016   Memory loss 03/09/2016   Essential tremor 03/09/2016   Diabetic neuropathy (HCC) 03/09/2016   Vitamin D  deficiency 03/09/2016    Orientation RESPIRATION BLADDER Height & Weight     Self, Place  Normal Incontinent Weight: 151 lb 0.2 oz (68.5 kg) Height:  6' (182.9 cm)  BEHAVIORAL SYMPTOMS/MOOD NEUROLOGICAL BOWEL NUTRITION STATUS      Continent Diet (Heart healthy/carb modified diet)  AMBULATORY STATUS COMMUNICATION OF NEEDS Skin   Extensive Assist Verbally Skin abrasions, Other (Comment) (Erythema: buttocks; Abrasion: left arm; Ecchymosis: right arm, left leg)                       Personal Care Assistance Level of Assistance  Bathing, Feeding, Dressing Bathing Assistance: Limited assistance Feeding assistance: Independent Dressing Assistance: Limited assistance     Functional Limitations Info  Sight, Hearing, Speech Sight Info: Impaired Hearing Info: Impaired Speech Info: Adequate    SPECIAL CARE FACTORS FREQUENCY  PT (By licensed PT), OT (By licensed OT)     PT Frequency: 5x's/week OT Frequency: 5x's/week            Contractures Contractures Info: Not present    Additional Factors Info  Code Status, Allergies, Psychotropic, Insulin  Sliding Scale Code Status Info: Full Allergies Info: Donepezil , Tape Psychotropic Info: See discharge summary Insulin  Sliding Scale Info: See discharge summary       Current Medications (10/29/2023):  This is the current hospital active medication  list Current Facility-Administered Medications  Medication Dose Route Frequency Provider Last Rate Last Admin   acetaminophen  (TYLENOL ) tablet 650 mg  650 mg Oral Q6H PRN Rathore, Vasundhra, MD   650 mg at 10/29/23 0230   Or   acetaminophen  (TYLENOL ) suppository 650 mg  650 mg Rectal Q6H PRN Alfornia Madison, MD       Influenza vac split trivalent PF (FLUZONE HIGH-DOSE) injection 0.5 mL  0.5 mL Intramuscular Tomorrow-1000 Pahwani, Fredia,  MD       insulin  aspart (novoLOG ) injection 0-5 Units  0-5 Units Subcutaneous QHS Rathore, Vasundhra, MD       insulin  aspart (novoLOG ) injection 0-9 Units  0-9 Units Subcutaneous TID WC Rathore, Vasundhra, MD   1 Units at 10/29/23 0757   lidocaine  (PF) (XYLOCAINE ) 1 % injection 5 mL  5 mL Infiltration Once Keith, Kayla N, PA-C         Discharge Medications: Please see discharge summary for a list of discharge medications.  Relevant Imaging Results:  Relevant Lab Results:   Additional Information SSN: [id number]  Duwaine GORMAN Aran, LCSW

## 2023-10-30 DIAGNOSIS — S06360A Traumatic hemorrhage of cerebrum, unspecified, without loss of consciousness, initial encounter: Secondary | ICD-10-CM | POA: Diagnosis not present

## 2023-10-30 DIAGNOSIS — K2100 Gastro-esophageal reflux disease with esophagitis, without bleeding: Secondary | ICD-10-CM | POA: Diagnosis not present

## 2023-10-30 DIAGNOSIS — R2689 Other abnormalities of gait and mobility: Secondary | ICD-10-CM | POA: Diagnosis not present

## 2023-10-30 DIAGNOSIS — E1142 Type 2 diabetes mellitus with diabetic polyneuropathy: Secondary | ICD-10-CM | POA: Diagnosis not present

## 2023-10-30 DIAGNOSIS — R531 Weakness: Secondary | ICD-10-CM | POA: Diagnosis not present

## 2023-10-30 DIAGNOSIS — M7989 Other specified soft tissue disorders: Secondary | ICD-10-CM | POA: Diagnosis not present

## 2023-10-30 DIAGNOSIS — E785 Hyperlipidemia, unspecified: Secondary | ICD-10-CM | POA: Diagnosis not present

## 2023-10-30 DIAGNOSIS — J069 Acute upper respiratory infection, unspecified: Secondary | ICD-10-CM | POA: Diagnosis not present

## 2023-10-30 DIAGNOSIS — S0181XD Laceration without foreign body of other part of head, subsequent encounter: Secondary | ICD-10-CM | POA: Diagnosis not present

## 2023-10-30 DIAGNOSIS — R41841 Cognitive communication deficit: Secondary | ICD-10-CM | POA: Diagnosis not present

## 2023-10-30 DIAGNOSIS — D62 Acute posthemorrhagic anemia: Secondary | ICD-10-CM | POA: Diagnosis not present

## 2023-10-30 DIAGNOSIS — Z9181 History of falling: Secondary | ICD-10-CM | POA: Diagnosis not present

## 2023-10-30 DIAGNOSIS — Z7401 Bed confinement status: Secondary | ICD-10-CM | POA: Diagnosis not present

## 2023-10-30 DIAGNOSIS — R051 Acute cough: Secondary | ICD-10-CM | POA: Diagnosis not present

## 2023-10-30 DIAGNOSIS — M19012 Primary osteoarthritis, left shoulder: Secondary | ICD-10-CM | POA: Diagnosis not present

## 2023-10-30 DIAGNOSIS — R0981 Nasal congestion: Secondary | ICD-10-CM | POA: Diagnosis not present

## 2023-10-30 DIAGNOSIS — R2681 Unsteadiness on feet: Secondary | ICD-10-CM | POA: Diagnosis not present

## 2023-10-30 DIAGNOSIS — I618 Other nontraumatic intracerebral hemorrhage: Secondary | ICD-10-CM | POA: Diagnosis not present

## 2023-10-30 DIAGNOSIS — R278 Other lack of coordination: Secondary | ICD-10-CM | POA: Diagnosis not present

## 2023-10-30 DIAGNOSIS — M6281 Muscle weakness (generalized): Secondary | ICD-10-CM | POA: Diagnosis not present

## 2023-10-30 DIAGNOSIS — E46 Unspecified protein-calorie malnutrition: Secondary | ICD-10-CM | POA: Diagnosis not present

## 2023-10-30 DIAGNOSIS — I10 Essential (primary) hypertension: Secondary | ICD-10-CM | POA: Diagnosis not present

## 2023-10-30 DIAGNOSIS — W19XXXA Unspecified fall, initial encounter: Secondary | ICD-10-CM | POA: Diagnosis not present

## 2023-10-30 DIAGNOSIS — R638 Other symptoms and signs concerning food and fluid intake: Secondary | ICD-10-CM | POA: Diagnosis not present

## 2023-10-30 DIAGNOSIS — L89322 Pressure ulcer of left buttock, stage 2: Secondary | ICD-10-CM | POA: Diagnosis not present

## 2023-10-30 DIAGNOSIS — M25512 Pain in left shoulder: Secondary | ICD-10-CM | POA: Diagnosis not present

## 2023-10-30 DIAGNOSIS — K654 Sclerosing mesenteritis: Secondary | ICD-10-CM | POA: Diagnosis not present

## 2023-10-30 DIAGNOSIS — G309 Alzheimer's disease, unspecified: Secondary | ICD-10-CM | POA: Diagnosis not present

## 2023-10-30 DIAGNOSIS — I615 Nontraumatic intracerebral hemorrhage, intraventricular: Secondary | ICD-10-CM | POA: Diagnosis not present

## 2023-10-30 LAB — GLUCOSE, CAPILLARY
Glucose-Capillary: 182 mg/dL — ABNORMAL HIGH (ref 70–99)
Glucose-Capillary: 114 mg/dL — ABNORMAL HIGH (ref 70–99)

## 2023-10-30 MED ORDER — ATORVASTATIN CALCIUM 40 MG PO TABS
40.0000 mg | ORAL_TABLET | Freq: Every day | ORAL | Status: DC
  Administered 2023-10-30: 40 mg via ORAL
  Filled 2023-10-30: qty 1

## 2023-10-30 MED ORDER — CALCIUM CARBONATE-VITAMIN D 500-200 MG-UNIT PO TABS: 1.0000 | ORAL_TABLET | Freq: Every day | ORAL | Status: DC

## 2023-10-30 MED ORDER — LINAGLIPTIN 5 MG PO TABS
5.0000 mg | ORAL_TABLET | Freq: Every day | ORAL | Status: DC
  Administered 2023-10-30: 5 mg via ORAL
  Filled 2023-10-30: qty 1

## 2023-10-30 MED ORDER — MEMANTINE HCL 10 MG PO TABS
5.0000 mg | ORAL_TABLET | Freq: Two times a day (BID) | ORAL | Status: DC
  Administered 2023-10-30: 5 mg via ORAL
  Filled 2023-10-30: qty 1

## 2023-10-30 MED ORDER — HYDRALAZINE HCL 20 MG/ML IJ SOLN
10.0000 mg | Freq: Four times a day (QID) | INTRAMUSCULAR | Status: DC | PRN
  Filled 2023-10-30: qty 1

## 2023-10-30 MED ORDER — CALCIUM CARBONATE 1250 (500 CA) MG PO TABS
1.0000 | ORAL_TABLET | Freq: Every day | ORAL | Status: DC
  Administered 2023-10-30: 1250 mg via ORAL
  Filled 2023-10-30: qty 1

## 2023-10-30 MED ORDER — CHOLECALCIFEROL 10 MCG (400 UNIT) PO TABS
200.0000 [IU] | ORAL_TABLET | Freq: Every day | ORAL | Status: DC
  Administered 2023-10-30: 200 [IU] via ORAL
  Filled 2023-10-30: qty 1

## 2023-10-30 MED ORDER — PREGABALIN 75 MG PO CAPS
75.0000 mg | ORAL_CAPSULE | Freq: Every day | ORAL | Status: DC
  Administered 2023-10-30: 75 mg via ORAL
  Filled 2023-10-30: qty 1

## 2023-10-30 MED ORDER — EMPAGLIFLOZIN 10 MG PO TABS
10.0000 mg | ORAL_TABLET | Freq: Every day | ORAL | Status: DC
Start: 2023-10-30 — End: 2023-10-30
  Administered 2023-10-30: 10 mg via ORAL
  Filled 2023-10-30: qty 1

## 2023-10-30 MED ORDER — RAMIPRIL 1.25 MG PO CAPS
2.5000 mg | ORAL_CAPSULE | Freq: Every day | ORAL | Status: DC
  Administered 2023-10-30: 2.5 mg via ORAL
  Filled 2023-10-30: qty 2

## 2023-10-30 NOTE — Care Management Obs Status (Signed)
 MEDICARE OBSERVATION STATUS NOTIFICATION   Patient Details  Name: Daniel [name] MRN: [id number] Date of Birth: Oct 05, 1935   Medicare Observation Status Notification Given:  Yes    MahabirNathanel, RN 10/30/2023, 9:22 AM

## 2023-10-30 NOTE — Discharge Summary (Signed)
 Physician Discharge Summary  Daniel [name] FMW:[id number] DOB: Jun 01, 1935 DOA: 10/27/2023  PCP: Clarice Nottingham, MD  Admit date: 10/27/2023 Discharge date: 10/30/2023    Admitted From: Home Disposition: Home  Recommendations for Outpatient Follow-up:  Follow up with PCP in 1-2 weeks Please obtain BMP/CBC in one week Please follow up with your PCP on the following pending results: Unresulted Labs (From admission, onward)    None       Home Health: Yes Equipment/Devices: None  Discharge Condition: Stable CODE STATUS: Full code Diet recommendation:  Diet Order             Diet heart healthy/carb modified Room service appropriate? Yes; Fluid consistency: Thin  Diet effective now                 Due to very brief hospitalization, I have copied admitting hospitalist HPI and ED course as below.  HPI: Daniel [name] is a 88 y.o. male with medical history significant of Alzheimer's dementia, type 2 diabetes, hypertension, hyperlipidemia, GERD presented to the ED for evaluation of head injury after a mechanical fall.  He was noted to have a laceration on the left side of his head along with left-sided rib pain, neck pain, and left hip tenderness.  Not on anticoagulation or antiplatelet agents.  Left temporal laceration was repaired with 3 staples.  Tdap injection given.  Patient noted to be bradycardic with heart rate as low as high 40s but not hypotensive.  No significant findings on CBC.  BMP noted potassium 5.2, glucose 135, BUN 25, creatinine 1.1 (previously 0.8-1.0 on labs done in 2019).  CT head showing findings consistent with acute intraventricular hemorrhage on the left.  Showing chronic subdural collections similar to that seen on prior exam.  CT C-spine negative for acute findings.  CT chest/abdomen/pelvis negative for acute findings.  Showing soft tissue density surrounding fatty tissue in the mid abdomen likely representing an area of fat necrosis for which no further  follow-up is recommended by radiologist.  X-ray of left hip/pelvis showing no acute bony abnormality.  Regarding intraventricular hemorrhage on brain imaging, neurosurgery recommended no further imaging or neurosurgical intervention and felt that it was okay to discharge the patient from their standpoint with follow-up PRN.  Subjective: Patient seen and examined.  Alert and oriented.  No complaints other than left shoulder pain.  Brief/Interim Summary: Details of hospitalization as below.  Acute left-sided intraventricular hemorrhage Secondary to head injury from a mechanical fall.  Not on anticoagulation or antiplatelet agents. Neurosurgery recommended no further imaging or neurosurgical intervention and felt that it was okay to discharge the patient from their standpoint with follow-up PRN.  However patient was still admitted under hospital service and apparently because the wife told the ED physician that she did not think that she would be able to take care of him at home.  Patient was then seen by PT OT, they noticed that patient was requiring much more assistance than normally he does at home and they recommended SNF since wife is unable to take care of him by herself.  Patient is now being discharged in a stable condition to SNF.   Left temporal forehead laceration Repaired with 3 staples by ED provider.  No active bleeding.  Tdap injection given.  Follow-up with PCP for removal of the staples in next 3 to 4 days.   Bradycardia Heart rate as low as high 40s in the ED but not hypotensive.  Heart rate now improved to the  80s.  EKG showing sinus rhythm with first-degree AV block.  Does not appear to be on any AV nodal blocking agents.   Dehydration Received hydration.   Borderline hyperkalemia Resolved.   Recent mechanical fall Still awaiting PT evaluation.  I think he will benefit from home health.  I have requested home health to be arranged for him.   Alzheimer's dementia: Delirium  precautions. Hypertension Hyperlipidemia GERD Resume all home medications.  Left shoulder pain: X-ray obtained today which shows no acute fracture or dislocation of the left shoulder, has chronic rotator cuff arthropathy.   Discharge plan was discussed with patient and/or family member and they verbalized understanding and agreed with it.  Discharge Diagnoses:  Principal Problem:   Intraventricular hemorrhage (HCC) Active Problems:   Forehead laceration   Bradycardia   Dehydration   Fall at home, initial encounter    Discharge Instructions   Allergies as of 10/30/2023       Reactions   Donepezil  Diarrhea   Tape Rash   PAPER TAPE IS PREFERRED, PLEASE        Medication List     STOP taking these medications    famotidine 40 MG tablet Commonly known as: PEPCID   naproxen sodium 220 MG tablet Commonly known as: ALEVE       TAKE these medications    atorvastatin  40 MG tablet Commonly known as: LIPITOR Take 40 mg by mouth daily at 6 (six) AM.   calcium -vitamin D  500-200 MG-UNIT tablet Commonly known as: OSCAL WITH D Take 1 tablet by mouth daily with breakfast.   CENTRUM SILVER ADULT 50+ PO Take 1 tablet by mouth daily.   empagliflozin 10 MG Tabs tablet Commonly known as: JARDIANCE Take 1 tablet by mouth daily.   memantine 5 MG tablet Commonly known as: NAMENDA Take 5 mg by mouth 2 (two) times daily.   pregabalin  75 MG capsule Commonly known as: LYRICA  Take 75 mg by mouth daily.   ramipril  2.5 MG capsule Commonly known as: ALTACE  Take 2.5 mg by mouth daily.   rivastigmine  9.5 mg/24hr Commonly known as: EXELON  APPLY 1 PATCH(9.5 MG) TOPICALLY TO THE SKIN DAILY   sitaGLIPtin 100 MG tablet Commonly known as: JANUVIA Take 100 mg by mouth daily.   VITAMIN C PO Take 1 tablet by mouth daily.        Contact information for follow-up providers     Clarice Nottingham, MD .   Specialty: Internal Medicine Why: As needed Contact information: [address] [place] [address] [place] [address] [phone]         [facility] Emergency Department at [facility] .   Specialty: Emergency Medicine Why: As needed Contact information: [address] [place]  [address] [phone]        Clarice Nottingham, MD Follow up in 1 week(s).   Specialty: Internal Medicine Contact information: [address] [place] [address] [place] [address] [phone]              Contact information for after-discharge care     Destination     [facility] .   Service: Skilled Nursing Contact information: [address] [place]   [address] [phone]                    Allergies  Allergen Reactions   Donepezil  Diarrhea   Tape Rash    PAPER TAPE IS PREFERRED, PLEASE    Consultations: Neurosurgery   Procedures/Studies: DG Shoulder Left Result Date:  10/29/2023 CLINICAL DATA:  Shoulder pain, fall. EXAM: LEFT SHOULDER - 2+ VIEW COMPARISON:  Included portions from chest CT 10/27/2023 FINDINGS: There is no evidence of fracture or dislocation. Mild acromioclavicular degenerative change. Superior subluxation of the humeral head consistent with chronic rotator cuff arthropathy. Soft tissues are unremarkable. Review of recent chest CT demonstrates nondisplaced fractures of left anterior second and third ribs, likely acute. IMPRESSION: 1. No acute fracture or dislocation of the left shoulder. 2. Superior subluxation of the humeral head typical of chronic rotator cuff arthropathy. 3. Review of recent chest CT demonstrates nondisplaced fractures of left anterior second and third ribs, likely acute. Electronically Signed   By: Andrea Gasman M.D.   On: 10/29/2023 10:53   CT CHEST ABDOMEN PELVIS W CONTRAST Result Date: 10/27/2023 CLINICAL DATA:  Recent fall with chest and abdominal pain, initial encounter EXAM: CT CHEST, ABDOMEN, AND PELVIS WITH CONTRAST TECHNIQUE: Multidetector CT  imaging of the chest, abdomen and pelvis was performed following the standard protocol during bolus administration of intravenous contrast. RADIATION DOSE REDUCTION: This exam was performed according to the departmental dose-optimization program which includes automated exposure control, adjustment of the mA and/or kV according to patient size and/or use of iterative reconstruction technique. CONTRAST:  100mL OMNIPAQUE IOHEXOL 300 MG/ML  SOLN COMPARISON:  Chest x-ray from earlier in the same day. FINDINGS: CT CHEST FINDINGS Cardiovascular: Atherosclerotic calcifications of the aorta are noted. No aneurysmal dilatation or dissection is seen. Pulmonary artery as visualized is within normal limits although not timed for embolus evaluation. Coronary calcifications noted. Mediastinum/Nodes: Thoracic inlet is within normal limits. No hilar or mediastinal adenopathy is noted. The esophagus as visualized is within normal limits. Lungs/Pleura: The lungs are well aerated bilaterally. No focal infiltrate or sizable effusion is noted. Musculoskeletal: Degenerative changes of the thoracic spine are seen. No acute rib abnormality is noted. CT ABDOMEN PELVIS FINDINGS Hepatobiliary: Fatty infiltration of the liver is noted. The gallbladder is unremarkable. Pancreas: Unremarkable. No pancreatic ductal dilatation or surrounding inflammatory changes. Spleen: Normal in size without focal abnormality. Adrenals/Urinary Tract: Adrenal glands are within normal limits. Kidneys demonstrate a normal enhancement pattern. No renal calculi are seen. No obstructive changes are noted. The bladder is partially distended. Stomach/Bowel: Scattered fecal material is noted throughout the colon. The appendix is not well visualized. No inflammatory changes are identified to suggest appendicitis. Small bowel and stomach are within normal limits. Vascular/Lymphatic: Aortic atherosclerosis. No enlarged abdominal or pelvic lymph nodes. Reproductive: Prostate  is unremarkable. Other: No abdominal wall hernia or abnormality. No abdominopelvic ascites. On image number 104 of series 2 there is a somewhat ovoid area of soft tissue surrounding central fatty attenuation. This likely represents an area of fatty necrosis. Musculoskeletal: Degenerative changes and postsurgical changes in the lumbar spine are noted. IMPRESSION: CT of the chest: No acute abnormality noted. CT of the abdomen and pelvis: No acute abnormality to correspond with the given clinical history. Soft tissue density surrounding fatty tissue in the mid abdomen likely representing an area of fat necrosis. No further follow-up is recommended. Electronically Signed   By: Oneil Devonshire M.D.   On: 10/27/2023 23:42   CT Head Wo Contrast Result Date: 10/27/2023 CLINICAL DATA:  Recent fall with headaches and neck pain, initial encounter EXAM: CT HEAD WITHOUT CONTRAST CT CERVICAL SPINE WITHOUT CONTRAST TECHNIQUE: Multidetector CT imaging of the head and cervical spine was performed following the standard protocol without intravenous contrast. Multiplanar CT image reconstructions of the cervical spine were also generated. RADIATION DOSE  REDUCTION: This exam was performed according to the departmental dose-optimization program which includes automated exposure control, adjustment of the mA and/or kV according to patient size and/or use of iterative reconstruction technique. COMPARISON:  01/20/2022 FINDINGS: CT HEAD FINDINGS Brain: Mild atrophic changes are noted. Chronic hypodense subdural collections are again seen bilaterally stable from the prior exam. A tiny amount of intraventricular blood is noted posteriorly in the left lateral ventricle. Additionally a small focus of hyperdensity is noted in the mid aspect of the left lateral ventricle best seen on coronal image number 37 of series 6. This likely represents an area of adherent thrombus given the hemorrhage. No other focal area of hemorrhage is seen. Vascular:  No hyperdense vessel or unexpected calcification. Skull: Burr holes are noted in the vertex similar to that seen on prior exam consistent with prior subdural drainage. Sinuses/Orbits: No acute finding. Other: Mild soft tissue swelling is noted in the left frontal region. CT CERVICAL SPINE FINDINGS Alignment: Within normal limits. Skull base and vertebrae: 7 cervical segments are well visualized. Vertebral body height is well maintained. Multilevel facet hypertrophic changes and osteophytic changes are seen. No acute fracture or acute facet abnormality is noted. The odontoid is within normal limits. 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 Soft tissues and spinal canal: Surrounding soft tissue structures are within normal limits Upper chest: Visualized lung apices are unremarkable. Other: None IMPRESSION: CT of the head: Findings consistent with acute intraventricular hemorrhage on the left as described. Chronic subdural collections similar to that seen on the prior exam. CT of the cervical spine: Degenerative changes without acute abnormality. Electronically Signed   By: Oneil Devonshire M.D.   On: 10/27/2023 20:17   CT Cervical Spine Wo Contrast Result Date: 10/27/2023 CLINICAL DATA:  Recent fall with headaches and neck pain, initial encounter EXAM: CT HEAD WITHOUT CONTRAST CT CERVICAL SPINE WITHOUT CONTRAST TECHNIQUE: Multidetector CT imaging of the head and cervical spine was performed following the standard protocol without intravenous contrast. Multiplanar CT image reconstructions of the cervical spine were also generated. RADIATION DOSE REDUCTION: This exam was performed according to the departmental dose-optimization program which includes automated exposure control, adjustment of the mA and/or kV according to patient size and/or use of iterative reconstruction technique. COMPARISON:  01/20/2022 FINDINGS: CT HEAD FINDINGS Brain: Mild atrophic changes are noted. Chronic hypodense subdural collections  are again seen bilaterally stable from the prior exam. A tiny amount of intraventricular blood is noted posteriorly in the left lateral ventricle. Additionally a small focus of hyperdensity is noted in the mid aspect of the left lateral ventricle best seen on coronal image number 37 of series 6. This likely represents an area of adherent thrombus given the hemorrhage. No other focal area of hemorrhage is seen. Vascular: No hyperdense vessel or unexpected calcification. Skull: Burr holes are noted in the vertex similar to that seen on prior exam consistent with prior subdural drainage. Sinuses/Orbits: No acute finding. Other: Mild soft tissue swelling is noted in the left frontal region. CT CERVICAL SPINE FINDINGS Alignment: Within normal limits. Skull base and vertebrae: 7 cervical segments are well visualized. Vertebral body height is well maintained. Multilevel facet hypertrophic changes and osteophytic changes are seen. No acute fracture or acute facet abnormality is noted. The odontoid is within normal limits. 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 Soft tissues and spinal canal: Surrounding soft tissue structures are within normal limits Upper chest: Visualized lung apices are unremarkable. Other: None IMPRESSION: CT of the head:  Findings consistent with acute intraventricular hemorrhage on the left as described. Chronic subdural collections similar to that seen on the prior exam. CT of the cervical spine: Degenerative changes without acute abnormality. Electronically Signed   By: Oneil Devonshire M.D.   On: 10/27/2023 20:17   DG Chest 1 View Result Date: 10/27/2023 CLINICAL DATA:  Fall EXAM: CHEST  1 VIEW COMPARISON:  None available FINDINGS: Mild elevation of the right hemidiaphragm. Heart and mediastinal contours are within normal limits. No focal opacities or effusions. No acute bony abnormality. IMPRESSION: No active disease. Electronically Signed   By: Franky Crease M.D.   On: 10/27/2023 19:34    DG HIP UNILAT WITH PELVIS 2-3 VIEWS LEFT Result Date: 10/27/2023 CLINICAL DATA:  Fall EXAM: DG HIP (WITH OR WITHOUT PELVIS) 2-3V LEFT COMPARISON:  None Available. FINDINGS: Mild symmetric degenerative changes in the hips. Postoperative changes in the lower lumbar spine. No acute bony abnormality. Specifically, no fracture, subluxation, or dislocation. IMPRESSION: No acute bony abnormality. Electronically Signed   By: Franky Crease M.D.   On: 10/27/2023 19:33     Discharge Exam: Vitals:   10/30/23 0539 10/30/23 0541  BP: (!) 188/93 (!) 175/82  Pulse: 76 69  Resp: 19   Temp: 98.4 F (36.9 C)   SpO2:     Vitals:   10/29/23 2036 10/29/23 2155 10/30/23 0539 10/30/23 0541  BP: (!) 171/79 (!) 179/79 (!) 188/93 (!) 175/82  Pulse: 67 65 76 69  Resp: 18  19   Temp: 98.4 F (36.9 C)  98.4 F (36.9 C)   TempSrc: Oral  Oral   SpO2: 98%     Weight:      Height:        General: Pt is alert, awake, not in acute distress, has staples at the left temporal area. Cardiovascular: RRR, S1/S2 +, no rubs, no gallops Respiratory: CTA bilaterally, no wheezing, no rhonchi Abdominal: Soft, NT, ND, bowel sounds + Extremities: no edema, no cyanosis    The results of significant diagnostics from this hospitalization (including imaging, microbiology, ancillary and laboratory) are listed below for reference.     Microbiology: No results found for this or any previous visit (from the past 240 hours).   Labs: BNP (last 3 results) No results for input(s): BNP in the last 8760 hours. Basic Metabolic Panel: Recent Labs  Lab 10/27/23 1852 10/28/23 0356 10/29/23 0846  NA 140 139 138  K 5.2* 4.8 4.0  CL 105 104 102  CO2 25 22 25   GLUCOSE 135* 170* 131*  BUN 25* 26* 28*  CREATININE 1.11 1.13 1.15  CALCIUM  9.8 9.8 9.8   Liver Function Tests: Recent Labs  Lab 10/28/23 0356  AST 26  ALT 16  ALKPHOS 81  BILITOT 0.7  PROT 7.3  ALBUMIN  4.4   No results for input(s): LIPASE, AMYLASE  in the last 168 hours. No results for input(s): AMMONIA in the last 168 hours. CBC: Recent Labs  Lab 10/27/23 1852 10/29/23 0846  WBC 10.5 9.1  NEUTROABS 8.9* 7.0  HGB 15.8 15.4  HCT 49.5 48.3  MCV 98.0 96.6  PLT 162 148*   Cardiac Enzymes: No results for input(s): CKTOTAL, CKMB, CKMBINDEX, TROPONINI in the last 168 hours. BNP: Invalid input(s): POCBNP CBG: Recent Labs  Lab 10/29/23 0748 10/29/23 1117 10/29/23 1604 10/29/23 2031 10/30/23 0742  GLUCAP 150* 189* 139* 190* 182*   D-Dimer No results for input(s): DDIMER in the last 72 hours. Hgb A1c Recent Labs  10/28/23 0356  HGBA1C 6.8*   Lipid Profile No results for input(s): CHOL, HDL, LDLCALC, TRIG, CHOLHDL, LDLDIRECT in the last 72 hours. Thyroid  function studies No results for input(s): TSH, T4TOTAL, T3FREE, THYROIDAB in the last 72 hours.  Invalid input(s): FREET3 Anemia work up No results for input(s): VITAMINB12, FOLATE, FERRITIN, TIBC, IRON, RETICCTPCT in the last 72 hours. Urinalysis    Component Value Date/Time   COLORURINE YELLOW 03/19/2017 0819   APPEARANCEUR CLEAR 03/19/2017 0819   LABSPEC 1.015 03/19/2017 0819   PHURINE 7.0 03/19/2017 0819   GLUCOSEU NEGATIVE 03/19/2017 0819   HGBUR NEGATIVE 03/19/2017 0819   BILIRUBINUR NEGATIVE 03/19/2017 0819   KETONESUR NEGATIVE 03/19/2017 0819   PROTEINUR NEGATIVE 03/19/2017 0819   UROBILINOGEN 1.0 01/01/2012 1433   NITRITE NEGATIVE 03/19/2017 0819   LEUKOCYTESUR NEGATIVE 03/19/2017 0819   Sepsis Labs Recent Labs  Lab 10/27/23 1852 10/29/23 0846  WBC 10.5 9.1   Microbiology No results found for this or any previous visit (from the past 240 hours).  FURTHER DISCHARGE INSTRUCTIONS:   Get Medicines reviewed and adjusted: Please take all your medications with you for your next visit with your Primary MD   Laboratory/radiological data: Please request your Primary MD to go over all hospital tests  and procedure/radiological results at the follow up, please ask your Primary MD to get all Hospital records sent to his/her office.   In some cases, they will be blood work, cultures and biopsy results pending at the time of your discharge. Please request that your primary care M.D. goes through all the records of your hospital data and follows up on these results.   Also Note the following: If you experience worsening of your admission symptoms, develop shortness of breath, life threatening emergency, suicidal or homicidal thoughts you must seek medical attention immediately by calling 911 or calling your MD immediately  if symptoms less severe.   You must read complete instructions/literature along with all the possible adverse reactions/side effects for all the Medicines you take and that have been prescribed to you. Take any new Medicines after you have completely understood and accpet all the possible adverse reactions/side effects.    patient was instructed, not to drive, operate heavy machinery, perform activities at heights, swimming or participation in water activities or provide baby-sitting services while on Pain, Sleep and Anxiety Medications; until their outpatient Physician has advised to do so again. Also recommended to not to take more than prescribed Pain, Sleep and Anxiety Medications.  It is not advisable to combine anxiety, sleep and pain medications without talking with your primary care provider.     Wear Seat belts while driving.   Please note: You were cared for by a hospitalist during your hospital stay. Once you are discharged, your primary care physician will handle any further medical issues. Please note that NO REFILLS for any discharge medications will be authorized once you are discharged, as it is imperative that you return to your primary care physician (or establish a relationship with a primary care physician if you do not have one) for your post hospital discharge  needs so that they can reassess your need for medications and monitor your lab values  Time coordinating discharge: Over 30 minutes  SIGNED:   Fredia Skeeter, MD  [facility] 10/30/2023, 9:31 AM *Please note that this is a verbal dictation therefore any spelling or grammatical errors are due to the Dragon Medical One system interpretation. If 7PM-7AM, please contact night-coverage [email or web address]

## 2023-10-30 NOTE — TOC Progression Note (Signed)
 Transition of Care [facility]) - Progression Note   Patient Details  Name: Daniel [name] MRN: [id number] Date of Birth: 1935/06/30  [facility] of [facility]) CM/SW Contact  Duwaine GORMAN Aran, LCSW Phone Number: 10/30/2023, 8:25 AM  Clinical Narrative: CSW notified by Tammy with [facility] that patient is approved for SNF [phone]) for 7 days and PTAR [phone]). [facility] made a bed offer. CSW confirmed with [place] in admissions at [facility] that patient could come today. CSW left voicemail for Tammy regarding family's choice for SNF.  Expected Discharge Plan: Skilled Nursing Facility Barriers to Discharge: SNF Pending bed offer, Insurance Authorization  Expected Discharge Plan and Services In-house Referral: Clinical Social Work Post Acute Care Choice: Skilled Nursing Facility Living arrangements for the past 2 months: Single Family Home Expected Discharge Date: 10/28/23               DME Arranged: N/A DME Agency: NA  Social Drivers of Health (SDOH) Interventions SDOH Screenings   Food Insecurity: No Food Insecurity (10/28/2023)  Housing: Low Risk  (10/28/2023)  Transportation Needs: No Transportation Needs (10/28/2023)  Utilities: Not At Risk (10/28/2023)  Social Connections: Socially Integrated (10/28/2023)  Tobacco Use: Medium Risk (10/27/2023)   Readmission Risk Interventions     No data to display

## 2023-10-30 NOTE — TOC Transition Note (Addendum)
 Transition of Care [facility]) - Discharge Note   Patient Details  Name: Daniel [name] MRN: [id number] Date of Birth: 1935/03/22  Transition of Care [facility]) CM/SW Contact:  Bascom Service, RN Phone Number: 10/30/2023, 9:24 AM   Clinical Narrative: refer to prior [facility] note for additional details. D/c to [employer] aware. [name](spouse) agree. MD updated. -10:20a going to [facility] rm[facility][id number], report #[phone]. [facility] called. No further CM needs.      Final next level of care: Skilled Nursing Facility Barriers to Discharge: No Barriers Identified   Patient Goals and CMS Choice Patient states their goals for this hospitalization and ongoing recovery are:: Rehab CMS Medicare.gov Compare Post Acute Care list provided to:: Patient Represenative (must comment) ([name](spouse)) Choice offered to / list presented to : Spouse [place] ownership interest in [facility].provided to:: Spouse    Discharge Placement              Patient chooses bed at: [facility] Patient to be transferred to facility by: [facility] Name of family member notified: [name] Minkin(spouse) Patient and family notified of of transfer: 10/30/23  Discharge Plan and Services Additional resources added to the After Visit Summary for   In-house Referral: Clinical Social Work   Post Acute Care Choice: Skilled Nursing Facility          DME Arranged: N/A DME Agency: NA                  Social Drivers of Health (SDOH) Interventions SDOH Screenings   Food Insecurity: No Food Insecurity (10/28/2023)  Housing: Low Risk  (10/28/2023)  Transportation Needs: No Transportation Needs (10/28/2023)  Utilities: Not At Risk (10/28/2023)  Social Connections: Socially Integrated (10/28/2023)  Tobacco Use: Medium Risk (10/27/2023)     Readmission Risk Interventions     No data to display

## 2023-11-01 DIAGNOSIS — D62 Acute posthemorrhagic anemia: Secondary | ICD-10-CM | POA: Diagnosis not present

## 2023-11-01 DIAGNOSIS — G309 Alzheimer's disease, unspecified: Secondary | ICD-10-CM | POA: Diagnosis not present

## 2023-11-01 DIAGNOSIS — I615 Nontraumatic intracerebral hemorrhage, intraventricular: Secondary | ICD-10-CM | POA: Diagnosis not present

## 2023-11-01 DIAGNOSIS — E1142 Type 2 diabetes mellitus with diabetic polyneuropathy: Secondary | ICD-10-CM | POA: Diagnosis not present

## 2023-11-01 DIAGNOSIS — I10 Essential (primary) hypertension: Secondary | ICD-10-CM | POA: Diagnosis not present

## 2023-11-01 DIAGNOSIS — S0181XD Laceration without foreign body of other part of head, subsequent encounter: Secondary | ICD-10-CM | POA: Diagnosis not present

## 2023-11-01 DIAGNOSIS — Z9181 History of falling: Secondary | ICD-10-CM | POA: Diagnosis not present

## 2023-11-01 DIAGNOSIS — L89322 Pressure ulcer of left buttock, stage 2: Secondary | ICD-10-CM | POA: Diagnosis not present

## 2023-11-05 DIAGNOSIS — L89322 Pressure ulcer of left buttock, stage 2: Secondary | ICD-10-CM | POA: Diagnosis not present

## 2023-11-05 DIAGNOSIS — E1142 Type 2 diabetes mellitus with diabetic polyneuropathy: Secondary | ICD-10-CM | POA: Diagnosis not present

## 2023-11-05 DIAGNOSIS — D62 Acute posthemorrhagic anemia: Secondary | ICD-10-CM | POA: Diagnosis not present

## 2023-11-05 DIAGNOSIS — S0181XD Laceration without foreign body of other part of head, subsequent encounter: Secondary | ICD-10-CM | POA: Diagnosis not present

## 2023-11-09 DIAGNOSIS — M25512 Pain in left shoulder: Secondary | ICD-10-CM | POA: Diagnosis not present

## 2023-11-09 DIAGNOSIS — R2689 Other abnormalities of gait and mobility: Secondary | ICD-10-CM | POA: Diagnosis not present

## 2023-11-15 DIAGNOSIS — L89322 Pressure ulcer of left buttock, stage 2: Secondary | ICD-10-CM | POA: Diagnosis not present

## 2023-11-15 DIAGNOSIS — E785 Hyperlipidemia, unspecified: Secondary | ICD-10-CM | POA: Diagnosis not present

## 2023-11-15 DIAGNOSIS — D62 Acute posthemorrhagic anemia: Secondary | ICD-10-CM | POA: Diagnosis not present

## 2023-11-15 DIAGNOSIS — E860 Dehydration: Secondary | ICD-10-CM | POA: Diagnosis not present

## 2023-11-20 DIAGNOSIS — R051 Acute cough: Secondary | ICD-10-CM | POA: Diagnosis not present

## 2023-11-20 DIAGNOSIS — R0981 Nasal congestion: Secondary | ICD-10-CM | POA: Diagnosis not present

## 2023-11-20 DIAGNOSIS — R638 Other symptoms and signs concerning food and fluid intake: Secondary | ICD-10-CM | POA: Diagnosis not present

## 2023-11-20 DIAGNOSIS — J069 Acute upper respiratory infection, unspecified: Secondary | ICD-10-CM | POA: Diagnosis not present

## 2023-11-22 DIAGNOSIS — R918 Other nonspecific abnormal finding of lung field: Secondary | ICD-10-CM | POA: Diagnosis not present

## 2023-11-22 DIAGNOSIS — J80 Acute respiratory distress syndrome: Secondary | ICD-10-CM | POA: Diagnosis not present

## 2023-11-22 DIAGNOSIS — E860 Dehydration: Secondary | ICD-10-CM | POA: Diagnosis not present

## 2023-11-22 DIAGNOSIS — L89322 Pressure ulcer of left buttock, stage 2: Secondary | ICD-10-CM | POA: Diagnosis not present

## 2023-11-22 DIAGNOSIS — E785 Hyperlipidemia, unspecified: Secondary | ICD-10-CM | POA: Diagnosis not present

## 2023-11-22 DIAGNOSIS — D62 Acute posthemorrhagic anemia: Secondary | ICD-10-CM | POA: Diagnosis not present

## 2023-11-26 DIAGNOSIS — D62 Acute posthemorrhagic anemia: Secondary | ICD-10-CM | POA: Diagnosis not present

## 2023-11-26 DIAGNOSIS — E1142 Type 2 diabetes mellitus with diabetic polyneuropathy: Secondary | ICD-10-CM | POA: Diagnosis not present

## 2023-11-26 DIAGNOSIS — E785 Hyperlipidemia, unspecified: Secondary | ICD-10-CM | POA: Diagnosis not present

## 2023-11-26 DIAGNOSIS — S0181XD Laceration without foreign body of other part of head, subsequent encounter: Secondary | ICD-10-CM | POA: Diagnosis not present

## 2023-11-29 DIAGNOSIS — E1142 Type 2 diabetes mellitus with diabetic polyneuropathy: Secondary | ICD-10-CM | POA: Diagnosis not present

## 2023-11-29 DIAGNOSIS — D62 Acute posthemorrhagic anemia: Secondary | ICD-10-CM | POA: Diagnosis not present

## 2023-11-29 DIAGNOSIS — S0181XD Laceration without foreign body of other part of head, subsequent encounter: Secondary | ICD-10-CM | POA: Diagnosis not present

## 2023-11-29 DIAGNOSIS — E785 Hyperlipidemia, unspecified: Secondary | ICD-10-CM | POA: Diagnosis not present

## 2023-12-06 DIAGNOSIS — E1142 Type 2 diabetes mellitus with diabetic polyneuropathy: Secondary | ICD-10-CM | POA: Diagnosis not present

## 2023-12-06 DIAGNOSIS — S0181XD Laceration without foreign body of other part of head, subsequent encounter: Secondary | ICD-10-CM | POA: Diagnosis not present

## 2023-12-06 DIAGNOSIS — D62 Acute posthemorrhagic anemia: Secondary | ICD-10-CM | POA: Diagnosis not present

## 2023-12-06 DIAGNOSIS — L89322 Pressure ulcer of left buttock, stage 2: Secondary | ICD-10-CM | POA: Diagnosis not present

## 2023-12-10 DIAGNOSIS — M6281 Muscle weakness (generalized): Secondary | ICD-10-CM | POA: Diagnosis not present

## 2023-12-10 DIAGNOSIS — N19 Unspecified kidney failure: Secondary | ICD-10-CM | POA: Diagnosis not present

## 2023-12-10 DIAGNOSIS — R2681 Unsteadiness on feet: Secondary | ICD-10-CM | POA: Diagnosis not present

## 2023-12-11 DIAGNOSIS — D62 Acute posthemorrhagic anemia: Secondary | ICD-10-CM | POA: Diagnosis not present

## 2023-12-11 DIAGNOSIS — L89322 Pressure ulcer of left buttock, stage 2: Secondary | ICD-10-CM | POA: Diagnosis not present

## 2023-12-11 DIAGNOSIS — S0181XD Laceration without foreign body of other part of head, subsequent encounter: Secondary | ICD-10-CM | POA: Diagnosis not present

## 2023-12-11 DIAGNOSIS — E1142 Type 2 diabetes mellitus with diabetic polyneuropathy: Secondary | ICD-10-CM | POA: Diagnosis not present

## 2023-12-18 DIAGNOSIS — I615 Nontraumatic intracerebral hemorrhage, intraventricular: Secondary | ICD-10-CM | POA: Diagnosis not present

## 2023-12-18 DIAGNOSIS — G309 Alzheimer's disease, unspecified: Secondary | ICD-10-CM | POA: Diagnosis not present

## 2023-12-18 DIAGNOSIS — E46 Unspecified protein-calorie malnutrition: Secondary | ICD-10-CM | POA: Diagnosis not present

## 2023-12-18 DIAGNOSIS — I10 Essential (primary) hypertension: Secondary | ICD-10-CM | POA: Diagnosis not present

## 2023-12-18 DIAGNOSIS — E1142 Type 2 diabetes mellitus with diabetic polyneuropathy: Secondary | ICD-10-CM | POA: Diagnosis not present

## 2023-12-18 DIAGNOSIS — E785 Hyperlipidemia, unspecified: Secondary | ICD-10-CM | POA: Diagnosis not present

## 2023-12-20 DIAGNOSIS — R1310 Dysphagia, unspecified: Secondary | ICD-10-CM | POA: Diagnosis not present

## 2023-12-20 DIAGNOSIS — S0181XD Laceration without foreign body of other part of head, subsequent encounter: Secondary | ICD-10-CM | POA: Diagnosis not present

## 2023-12-20 DIAGNOSIS — E1142 Type 2 diabetes mellitus with diabetic polyneuropathy: Secondary | ICD-10-CM | POA: Diagnosis not present

## 2023-12-20 DIAGNOSIS — B372 Candidiasis of skin and nail: Secondary | ICD-10-CM | POA: Diagnosis not present

## 2023-12-20 DIAGNOSIS — L89322 Pressure ulcer of left buttock, stage 2: Secondary | ICD-10-CM | POA: Diagnosis not present

## 2023-12-20 DIAGNOSIS — D62 Acute posthemorrhagic anemia: Secondary | ICD-10-CM | POA: Diagnosis not present

## 2024-01-31 ENCOUNTER — Ambulatory Visit: Admitting: Family Medicine
